# Patient Record
Sex: Male | Born: 1940
Health system: Southern US, Community
[De-identification: ages and names within clinical notes are randomized; demographics above are authoritative.]

## PROBLEM LIST (undated history)

## (undated) DIAGNOSIS — D126 Benign neoplasm of colon, unspecified: Secondary | ICD-10-CM

## (undated) DIAGNOSIS — K219 Gastro-esophageal reflux disease without esophagitis: Secondary | ICD-10-CM

## (undated) DIAGNOSIS — F419 Anxiety disorder, unspecified: Secondary | ICD-10-CM

## (undated) DIAGNOSIS — K8020 Calculus of gallbladder without cholecystitis without obstruction: Secondary | ICD-10-CM

## (undated) DIAGNOSIS — M1990 Unspecified osteoarthritis, unspecified site: Secondary | ICD-10-CM

## (undated) DIAGNOSIS — I7771 Dissection of carotid artery: Secondary | ICD-10-CM

## (undated) DIAGNOSIS — I10 Essential (primary) hypertension: Secondary | ICD-10-CM

## (undated) DIAGNOSIS — I493 Ventricular premature depolarization: Secondary | ICD-10-CM

## (undated) DIAGNOSIS — Z77098 Contact with and (suspected) exposure to other hazardous, chiefly nonmedicinal, chemicals: Secondary | ICD-10-CM

## (undated) DIAGNOSIS — H409 Unspecified glaucoma: Secondary | ICD-10-CM

## (undated) DIAGNOSIS — I7773 Dissection of renal artery: Secondary | ICD-10-CM

## (undated) DIAGNOSIS — H3500 Unspecified background retinopathy: Secondary | ICD-10-CM

## (undated) DIAGNOSIS — Z8619 Personal history of other infectious and parasitic diseases: Secondary | ICD-10-CM

## (undated) DIAGNOSIS — K5790 Diverticulosis of intestine, part unspecified, without perforation or abscess without bleeding: Secondary | ICD-10-CM

## (undated) DIAGNOSIS — K602 Anal fissure, unspecified: Secondary | ICD-10-CM

## (undated) DIAGNOSIS — F0390 Unspecified dementia without behavioral disturbance: Secondary | ICD-10-CM

## (undated) HISTORY — DX: Dissection of carotid artery: I77.71

## (undated) HISTORY — DX: Benign neoplasm of colon, unspecified: D12.6

## (undated) HISTORY — DX: Personal history of other infectious and parasitic diseases: Z86.19

## (undated) HISTORY — DX: Gastro-esophageal reflux disease without esophagitis: K21.9

## (undated) HISTORY — DX: Essential (primary) hypertension: I10

## (undated) HISTORY — DX: Anal fissure, unspecified: K60.2

## (undated) HISTORY — PX: VASECTOMY: SHX75

## (undated) HISTORY — DX: Diverticulosis of intestine, part unspecified, without perforation or abscess without bleeding: K57.90

## (undated) HISTORY — DX: Dissection of renal artery: I77.73

## (undated) HISTORY — DX: Unspecified glaucoma: H40.9

## (undated) HISTORY — PX: HERNIA REPAIR: SHX51

## (undated) HISTORY — DX: Contact with and (suspected) exposure to other hazardous, chiefly nonmedicinal, chemicals: Z77.098

## (undated) HISTORY — DX: Anxiety disorder, unspecified: F41.9

## (undated) HISTORY — DX: Calculus of gallbladder without cholecystitis without obstruction: K80.20

## (undated) HISTORY — PX: TOTAL SHOULDER REPLACEMENT: SUR1217

## (undated) HISTORY — DX: Unspecified osteoarthritis, unspecified site: M19.90

## (undated) HISTORY — DX: Unspecified background retinopathy: H35.00

## (undated) HISTORY — PX: NOSE SURGERY: SHX723

## (undated) HISTORY — DX: Ventricular premature depolarization: I49.3

---
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
## 1963-02-11 HISTORY — PX: TONSILLECTOMY: SUR1361

## 1967-02-11 DIAGNOSIS — Z8619 Personal history of other infectious and parasitic diseases: Secondary | ICD-10-CM

## 1967-02-11 HISTORY — DX: Personal history of other infectious and parasitic diseases: Z86.19

## 1983-02-11 HISTORY — PX: CERVICAL FUSION: SHX112

## 1988-02-11 HISTORY — PX: CHOLECYSTECTOMY: SHX55

## 1999-04-25 ENCOUNTER — Encounter: Payer: Self-pay | Admitting: Neurology

## 1999-04-25 ENCOUNTER — Ambulatory Visit (HOSPITAL_COMMUNITY): Admission: RE | Admit: 1999-04-25 | Discharge: 1999-04-25 | Payer: Self-pay | Admitting: Neurology

## 1999-10-09 ENCOUNTER — Inpatient Hospital Stay (HOSPITAL_COMMUNITY): Admission: EM | Admit: 1999-10-09 | Discharge: 1999-10-12 | Payer: Self-pay

## 1999-10-11 ENCOUNTER — Encounter: Payer: Self-pay | Admitting: Cardiology

## 1999-12-18 ENCOUNTER — Ambulatory Visit (HOSPITAL_COMMUNITY): Admission: RE | Admit: 1999-12-18 | Discharge: 1999-12-18 | Payer: Self-pay | Admitting: *Deleted

## 1999-12-18 ENCOUNTER — Encounter: Payer: Self-pay | Admitting: *Deleted

## 2005-05-21 ENCOUNTER — Ambulatory Visit: Payer: Self-pay | Admitting: Cardiovascular Disease

## 2005-06-11 ENCOUNTER — Ambulatory Visit: Payer: Self-pay

## 2005-06-11 ENCOUNTER — Encounter: Payer: Self-pay | Admitting: Internal Medicine

## 2005-06-12 ENCOUNTER — Ambulatory Visit: Payer: Self-pay | Admitting: Cardiovascular Disease

## 2006-09-04 ENCOUNTER — Ambulatory Visit: Payer: Self-pay | Admitting: Gastroenterology

## 2006-10-15 ENCOUNTER — Ambulatory Visit: Payer: Self-pay | Admitting: Gastroenterology

## 2007-04-28 ENCOUNTER — Ambulatory Visit: Payer: Self-pay | Admitting: Cardiology

## 2007-05-06 ENCOUNTER — Ambulatory Visit: Payer: Self-pay

## 2007-05-06 ENCOUNTER — Encounter: Payer: Self-pay | Admitting: Cardiology

## 2007-05-18 ENCOUNTER — Ambulatory Visit: Payer: Self-pay | Admitting: Cardiology

## 2007-10-05 ENCOUNTER — Ambulatory Visit: Payer: Self-pay | Admitting: Internal Medicine

## 2007-10-05 ENCOUNTER — Inpatient Hospital Stay (HOSPITAL_COMMUNITY): Admission: EM | Admit: 2007-10-05 | Discharge: 2007-10-07 | Payer: Self-pay | Admitting: Emergency Medicine

## 2007-10-12 ENCOUNTER — Ambulatory Visit (HOSPITAL_COMMUNITY): Admission: RE | Admit: 2007-10-12 | Discharge: 2007-10-12 | Payer: Self-pay | Admitting: Cardiology

## 2007-10-12 ENCOUNTER — Ambulatory Visit: Payer: Self-pay | Admitting: Cardiovascular Disease

## 2007-10-25 DIAGNOSIS — I10 Essential (primary) hypertension: Secondary | ICD-10-CM | POA: Insufficient documentation

## 2007-10-26 ENCOUNTER — Ambulatory Visit: Payer: Self-pay | Admitting: Pulmonary Disease

## 2007-10-26 DIAGNOSIS — J309 Allergic rhinitis, unspecified: Secondary | ICD-10-CM | POA: Insufficient documentation

## 2007-11-03 DIAGNOSIS — G4733 Obstructive sleep apnea (adult) (pediatric): Secondary | ICD-10-CM | POA: Insufficient documentation

## 2007-11-16 ENCOUNTER — Ambulatory Visit: Payer: Self-pay | Admitting: Cardiology

## 2007-11-25 ENCOUNTER — Emergency Department (HOSPITAL_COMMUNITY): Admission: EM | Admit: 2007-11-25 | Discharge: 2007-11-26 | Payer: Self-pay | Admitting: Emergency Medicine

## 2007-12-03 ENCOUNTER — Ambulatory Visit: Payer: Self-pay | Admitting: Cardiology

## 2007-12-20 ENCOUNTER — Ambulatory Visit: Payer: Self-pay | Admitting: Internal Medicine

## 2008-01-14 ENCOUNTER — Ambulatory Visit: Payer: Self-pay | Admitting: Internal Medicine

## 2008-02-14 ENCOUNTER — Ambulatory Visit: Payer: Self-pay | Admitting: Internal Medicine

## 2008-03-07 ENCOUNTER — Ambulatory Visit (HOSPITAL_BASED_OUTPATIENT_CLINIC_OR_DEPARTMENT_OTHER): Admission: RE | Admit: 2008-03-07 | Discharge: 2008-03-07 | Payer: Self-pay | Admitting: Internal Medicine

## 2008-03-10 ENCOUNTER — Ambulatory Visit: Payer: Self-pay | Admitting: Pulmonary Disease

## 2008-04-10 ENCOUNTER — Encounter: Payer: Self-pay | Admitting: Internal Medicine

## 2008-04-10 ENCOUNTER — Ambulatory Visit: Payer: Self-pay

## 2008-04-10 ENCOUNTER — Ambulatory Visit: Payer: Self-pay | Admitting: Internal Medicine

## 2008-04-19 ENCOUNTER — Ambulatory Visit: Payer: Self-pay | Admitting: Internal Medicine

## 2008-06-21 ENCOUNTER — Encounter (INDEPENDENT_AMBULATORY_CARE_PROVIDER_SITE_OTHER): Payer: Self-pay | Admitting: *Deleted

## 2008-08-09 ENCOUNTER — Ambulatory Visit: Payer: Self-pay | Admitting: Internal Medicine

## 2008-08-09 DIAGNOSIS — R0789 Other chest pain: Secondary | ICD-10-CM | POA: Insufficient documentation

## 2008-08-09 DIAGNOSIS — I4949 Other premature depolarization: Secondary | ICD-10-CM

## 2008-08-09 DIAGNOSIS — I7779 Dissection of other artery: Secondary | ICD-10-CM

## 2008-11-03 ENCOUNTER — Ambulatory Visit: Payer: Self-pay | Admitting: Internal Medicine

## 2008-11-03 ENCOUNTER — Ambulatory Visit: Payer: Self-pay

## 2008-11-03 ENCOUNTER — Encounter: Payer: Self-pay | Admitting: Internal Medicine

## 2009-04-23 ENCOUNTER — Telehealth (INDEPENDENT_AMBULATORY_CARE_PROVIDER_SITE_OTHER): Payer: Self-pay | Admitting: *Deleted

## 2009-05-03 ENCOUNTER — Encounter: Payer: Self-pay | Admitting: Gastroenterology

## 2009-05-08 ENCOUNTER — Encounter (INDEPENDENT_AMBULATORY_CARE_PROVIDER_SITE_OTHER): Payer: Self-pay | Admitting: *Deleted

## 2009-06-13 ENCOUNTER — Ambulatory Visit: Payer: Self-pay | Admitting: Gastroenterology

## 2009-06-13 DIAGNOSIS — R1310 Dysphagia, unspecified: Secondary | ICD-10-CM | POA: Insufficient documentation

## 2009-06-13 DIAGNOSIS — Z860100 Personal history of colon polyps, unspecified: Secondary | ICD-10-CM | POA: Insufficient documentation

## 2009-06-14 ENCOUNTER — Ambulatory Visit: Payer: Self-pay | Admitting: Gastroenterology

## 2009-07-20 ENCOUNTER — Encounter: Payer: Self-pay | Admitting: Cardiology

## 2009-08-01 ENCOUNTER — Telehealth (INDEPENDENT_AMBULATORY_CARE_PROVIDER_SITE_OTHER): Payer: Self-pay | Admitting: *Deleted

## 2009-08-16 ENCOUNTER — Telehealth (INDEPENDENT_AMBULATORY_CARE_PROVIDER_SITE_OTHER): Payer: Self-pay | Admitting: *Deleted

## 2009-08-30 ENCOUNTER — Telehealth: Payer: Self-pay | Admitting: Internal Medicine

## 2009-09-04 ENCOUNTER — Ambulatory Visit: Payer: Self-pay | Admitting: Cardiology

## 2009-09-04 ENCOUNTER — Emergency Department (HOSPITAL_COMMUNITY): Admission: EM | Admit: 2009-09-04 | Discharge: 2009-09-04 | Payer: Self-pay | Admitting: Emergency Medicine

## 2009-09-12 ENCOUNTER — Telehealth: Payer: Self-pay | Admitting: Cardiology

## 2009-12-06 ENCOUNTER — Ambulatory Visit: Payer: Self-pay

## 2009-12-06 ENCOUNTER — Ambulatory Visit: Payer: Self-pay | Admitting: Cardiology

## 2009-12-06 ENCOUNTER — Encounter: Payer: Self-pay | Admitting: Internal Medicine

## 2009-12-06 ENCOUNTER — Encounter: Payer: Self-pay | Admitting: Cardiology

## 2009-12-06 ENCOUNTER — Ambulatory Visit (HOSPITAL_COMMUNITY): Admission: RE | Admit: 2009-12-06 | Discharge: 2009-12-06 | Payer: Self-pay | Admitting: Cardiology

## 2009-12-06 ENCOUNTER — Ambulatory Visit: Payer: Self-pay | Admitting: Internal Medicine

## 2010-03-12 NOTE — Progress Notes (Signed)
Summary: heart fluttering/high blood pressure   Phone Note Call from Patient   Caller: Patient [phone] or [phone] Reason for Call: Talk to Nurse Summary of Call: pt having fluttering the last couple weeks, with some high blood pressure-pls call [phone] or [phone] Initial call taken by: Glynda Jaeger,  August 30, 2009 4:01 PM  Follow-up for Phone Call        talked with pt-pt received letter to schedule appt for Sept--pt has been having some increase in symptoms and is requesting an earlier appt--have given pt appt for 09/04/09 with Dr Graciela Husbands

## 2010-03-12 NOTE — Procedures (Signed)
Summary: Upper Endoscopy  Patient: [name] [name] Note: All result statuses are Final unless otherwise noted.  Tests: (1) Upper Endoscopy (EGD)   EGD Upper Endoscopy       DONE     [facility]     [address]     [place], [place]  [address]           ENDOSCOPY PROCEDURE REPORT           PATIENT:  [name] [name]  MR#:  [id number]     BIRTHDATE:  1940-12-07, 69 yrs. old  GENDER:  male           ENDOSCOPIST:  Barbette Hair. Arlyce Dice, MD     Referred by:           PROCEDURE DATE:  06/14/2009     PROCEDURE:  EGD, diagnostic, Maloney Dilation of Esophagus     ASA CLASS:  Class III     INDICATIONS:  chest pain, dysphagia           MEDICATIONS:   Fentanyl 50 mcg IV, Versed 6 mg IV, glycopyrrolate     (Robinal) 0.2 mg IV, 0.6cc simethancone 0.6 cc PO     TOPICAL ANESTHETIC:  Exactacain Spray           DESCRIPTION OF PROCEDURE:   After the risks benefits and     alternatives of the procedure were thoroughly explained, informed     consent was obtained.  The LB [id number]-H180 [id number] endoscope was     introduced through the mouth and advanced to the third portion of     the duodenum, without limitations.  The instrument was slowly     withdrawn as the mucosa was fully examined.     <<PROCEDUREIMAGES>>           A stricture was found at the gastroesophageal junction (see     image1). Early stricture Dilation with maloney dilator 18mm     Minimal resistance; no heme  Otherwise the examination was normal.     Retroflexed views revealed no abnormalities.    The scope was then     withdrawn from the patient and the procedure completed.           COMPLICATIONS:  None           ENDOSCOPIC IMPRESSION:     1) Stricture at the gastroesophageal junction - s/p maloney     dilitation     2) Otherwise normal examination           Findings do not clearly explain chest pain           RECOMMENDATIONS:     1) continue current medications - omeprazole     2) Call office next 2-3 days to  schedule an office appointment     for 1 month           REPEAT EXAM:  No           ______________________________     Barbette Hair. Arlyce Dice, MD           CC:  Robert Bellow, MD, Duke Salvia, MD           n.     Rosalie Doctor:   Barbette Hair. Kaplan at 06/14/2009 11:14 AM           Janan Halter, [phone]  Note: An exclamation mark (!) indicates a result that was not dispersed into the flowsheet. Document Creation Date: 06/14/2009 11:14 AM _______________________________________________________________________  (  1) Order result status: Final Collection or observation date-time: 06/14/2009 11:09 Requested date-time:  Receipt date-time:  Reported date-time:  Referring Physician:   Ordering Physician: Melvia Heaps [phone]) Specimen Source:  Source: Launa Grill Order Number: [id number] Lab site:   Appended Document: Upper Endoscopy

## 2010-03-12 NOTE — Letter (Signed)
Summary: EGD Instructions  [facility]  [address] [place], [place] [address]   Phone: [phone]  Fax: [phone]       [name] [name]    [birth year]    MRN: [id number]       Procedure Day /Date:06/14/2009 THURSDAY     Arrival Time: 9:30AM     Procedure Time:10:30AM    Location of Procedure:                    X  [facility] (4th Floor)    PREPARATION FOR ENDOSCOPY/?DIL   On 06/14/2009 THE DAY OF THE PROCEDURE:  1.   No solid foods, milk or milk products are allowed after midnight the night before your procedure.  2.   Do not drink anything colored red or purple.  Avoid juices with pulp.  No orange juice.  3.  You may drink clear liquids until 8:30AM which is 2 hours before your procedure.                                                                                                CLEAR LIQUIDS INCLUDE: Water Jello Ice Popsicles Tea (sugar ok, no milk/cream) Powdered fruit flavored drinks Coffee (sugar ok, no milk/cream) Gatorade Juice: apple, white grape, white cranberry  Lemonade Clear bullion, consomm, broth Carbonated beverages (any kind) Strained chicken noodle soup Hard Candy   MEDICATION INSTRUCTIONS  Unless otherwise instructed, you should take regular prescription medications with a small sip of water as early as possible the morning of your procedure.              OTHER INSTRUCTIONS  You will need a responsible adult at least 68 years of age to accompany you and drive you home.   This person must remain in the waiting room during your procedure.  Wear loose fitting clothing that is easily removed.  Leave jewelry and other valuables at home.  However, you may wish to bring a book to read or an iPod/MP3 player to listen to music as you wait for your procedure to start.  Remove all body piercing jewelry and leave at home.  Total time from sign-in until discharge is approximately 2-3 hours.  You should go home  directly after your procedure and rest.  You can resume normal activities the day after your procedure.  The day of your procedure you should not:   Drive   Make legal decisions   Operate machinery   Drink alcohol   Return to work  You will receive specific instructions about eating, activities and medications before you leave.    The above instructions have been reviewed and explained to me by   _______________________    I fully understand and can verbalize these instructions _____________________________ Date _________

## 2010-03-12 NOTE — Progress Notes (Signed)
   Phone Note Outgoing Call   Call placed by: Scherrie Bateman, LPN,  August 17, 8467 3:33 PM Call placed to: Patient Summary of Call: PT AWARE [facility] WORK FILLED OUT AND LEFT AT FRONT DESK FOR PICK UP. Initial call taken by: Scherrie Bateman, LPN,  August 16, 6293 3:34 PM

## 2010-03-12 NOTE — Progress Notes (Signed)
   Walk in Patient Form Recieved " pt left paper from Physicians statement for heart disease" sent to Message nurse [facility]  August 01, 2009 3:11 PM

## 2010-03-12 NOTE — Letter (Signed)
Summary: Physicians's Statement for Heart Disease  Physicians's Statement for Heart Disease   Imported By: Marylou Mccoy 09/12/2009 16:12:46  _____________________________________________________________________  External Attachment:    Type:   Image     Comment:   External Document

## 2010-03-12 NOTE — Procedures (Signed)
Summary: colonoscopy   Colonoscopy  Procedure date:  10/15/2006  Findings:      Results: Diverticulosis.       Location:  [facility].   Patient Name: [name], [name] MRN:  Procedure Procedures: Colonoscopy CPT: 16109.  Personnel: Endoscopist: Barbette Hair. Arlyce Dice, MD.  Referred By: Jonita Albee, MD.  Patient Consent: Procedure, Alternatives, Risks and Benefits discussed, consent obtained, from patient.  Indications  Surveillance of: Adenomatous Polyp(s).  History  Current Medications: Patient is not currently taking Coumadin.  Pre-Exam Physical: Performed Oct 15, 2006. Cardio-pulmonary exam, HEENT exam , Abdominal exam, Mental status exam WNL.  Comments: Patient history reviewed/updated, physical performed prior to initiation of sedation?yes Exam Exam: Extent of exam reached: Cecum, extent intended: Cecum.  Time to Cecum: 00:03:48. Time for Withdrawl: 00:05:13. Colon retroflexion performed. ASA Classification: II. Tolerance: good.  Monitoring: Pulse and BP monitoring, Oximetry used. Supplemental O2 given. at 2 Liters.  Colon Prep Used Miralax for colon prep. Prep results: good.  Sedation Meds: Patient assessed and found to be appropriate for moderate (conscious) sedation. Sedation was managed by the Endoscopist. Fentanyl 75 mcg. given IV. Versed 5 mg. given IV.  Findings - DIVERTICULOSIS: Transverse Colon to Sigmoid Colon. ICD9: Diverticulosis: 562.10. Comments: Diffuse diverticulosis, especially in left colon.  - NORMAL EXAM: Cecum to Transverse Colon.  NORMAL EXAM: Cecum.  - NORMAL EXAM: Sigmoid Colon to Rectum.   Assessment Abnormal examination, see findings above.  Diagnoses: 562.10: Diverticulosis.   Events  Unplanned Interventions: No intervention was required.  Unplanned Events: There were no complications. Plans  Post Exam Instructions: Post sedation instructions given.  Patient Education: Patient given standard  instructions for: Diverticulosis.  Disposition: After procedure patient sent to recovery. After recovery patient sent home.  Scheduling/Referral: Colonoscopy, to Barbette Hair. Arlyce Dice, MD, around Oct 15, 2011.    This report was created from the original endoscopy report, which was reviewed and signed by the above listed endoscopist.    cc. [name]

## 2010-03-12 NOTE — Assessment & Plan Note (Signed)
Summary: SEVERE GERD...EM    History of Present Illness Primary GI MD: Melvia Heaps MD [facility] Primary Provider: Dr. Perrin Maltese Requesting Provider: Karessa Onorato Bellow, MD History of Present Illness:   [name] [name] is a pleasant 70 year old white male referred at the request of Dr. Perrin Maltese for evaluation of chest pain.  He has a cardiomyopathy and has experienced several episodes of severe mid and lower chest pain.  Pain is spontaneous and without radiation.  He also has had multiple episodes of a mild but similar pain that was improved when he took prevacid.   He denies pyrosis.  He does have mild dysphagia to solids.  It is noteworthy that he takes Celebrex daily and occasional Aleve.  The patient's frequent PVCs which is attributed to his cardiomyopathy.   GI Review of Systems    Reports acid reflux, belching, chest pain, and  dysphagia with solids.      Denies abdominal pain, bloating, dysphagia with liquids, heartburn, loss of appetite, nausea, vomiting, vomiting blood, weight loss, and  weight gain.      Reports diverticulosis, hemorrhoids, liver problems, and  rectal bleeding.     Denies anal fissure, black tarry stools, change in bowel habit, constipation, diarrhea, fecal incontinence, heme positive stool, irritable bowel syndrome, jaundice, light color stool, and  rectal pain.    Current Medications (verified): 1)  Lisinopril 20 Mg Tabs (Lisinopril) .Marland Kitchen.. 1 Tab Once Daily 2)  Ecotrin 325 Mg Tbec (Aspirin) .... Take 1 Tablet By Mouth Once A Day 3)  Fluticasone Propionate 50 Mcg/act Susp (Fluticasone Propionate) .... Take 2 By Mouth Once Daily 4)  Vitamin D 1000 Unit Tabs (Cholecalciferol) .... 2 Caps Once Daily 5)  Vitamin C 1000 Mg Tabs (Ascorbic Acid) .Marland Kitchen.. 1 Tab Once Daily 6)  Verapamil Hcl Cr 120 Mg Xr24h-Cap (Verapamil Hcl) .... Take One Capsule By Mouth Once Daily 7)  Prevacid 30 Mg Cpdr (Lansoprazole) .... As Needed 8)  Celebrex 200 Mg Caps (Celecoxib) .Marland Kitchen.. 1 Cap Two Times A Day 9)  Ultram  Er 200 Mg Xr24h-Tab (Tramadol Hcl) .... As Needed 10)  Aleve 220 Mg Tabs (Naproxen Sodium) .... As Needed 11)  Alprazolam 0.25 Mg Tabs (Alprazolam) .Marland Kitchen.. 1 By Mouth Three Times A Day As Needed  Allergies (verified): 1)  ! Penicillin 2)  ! Morphine 3)  ! Percocet 4)  ! * Ivp Dye 5)  ! 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 what type Anal Fissure Anxiety Disorder Arthritis Asthma Adenomatous Colon Polyps Diverticulosis Gallstones Hypertension cardiomyopathy  Past Surgical History: Appendectomy 1963 Cervical fusion 1985 Cholecystectomy 1990 Tonsillectomy 1965 Hernia Surgery Carotid Disection Rt. Renal Artery Disection  Family History: Reviewed history from 10/26/2007 and no changes required. Allergies Self Father Heart Disease  Family History of Colon Cancer:uncle  Social History: Reviewed history from 10/26/2007 and no changes required. He is an ex-Marine.  He is married. He has three children. He is a retired Emergency planning/management officer. Patient states former smoker.  Alcohol Use - yes  4-5 per day Daily Caffeine Use Illicit Drug Use - no Drug Use:  no  Review of Systems       The patient complains of allergy/sinus, anxiety-new, arthritis/joint pain, back pain, change in vision, hearing problems, heart rhythm changes, sleeping problems, thirst - excessive, and urination - excessive.  The patient denies blood in urine, breast changes/lumps, confusion, cough, coughing up blood, depression-new, fainting, fatigue, fever, headaches-new, heart murmur, itching, muscle pains/cramps, night sweats, nosebleeds, shortness of breath, skin rash, sore throat, swelling of feet/legs, swollen lymph glands, urination changes/pain, urine leakage, vision changes, and voice  change.         All other systems were reviewed and were negative   Vital Signs:  Patient profile:   70 year old male Height:      70 inches Weight:      243.6 pounds BMI:     35.08 Pulse rate:   72 / minute Pulse rhythm:   regular BP sitting:   162 / 90  (left arm)  Vitals Entered By: [name] NCMA (Jun 13, 2009 10:10 AM)  Physical Exam  Additional Exam:  On physical exam he is well-developed well-nourished male  skin: anicteric HEENT: normocephalic; PEERLA; no nasal or pharyngeal abnormalities neck: supple nodes: no cervical lymphadenopathy chest: clear to ausculatation and percussion heart: no murmurs, gallops, or rubs abd: soft, nontender; BS normoactive; no abdominal masses, tenderness, organomegaly rectal: deferred ext: no cynanosis, clubbing, edema skeletal: no deformities neuro: oriented x 3; no focal abnormalities    Impression & Recommendations:  Problem # 1:  CHEST DISCOMFORT (ICD-786.59) Pain could be due to esophageal disease including  reflux, spasm and/or an esophageal stricture.  Symptoms of a stricture usually center around dysphagia.  Cardiac pain remains a distinct possibility.  Finally, it is noteworthy that the patient is taking Celebrex and occasional Aleve which could be contributing to his symptoms.  Recommendations #1 continue omeprazole #2 upper endoscopy  Risks, alternatives, and complications of the procedure, including bleeding, perforation, and possible need for surgery, were explained to the patient.  Patient's questions were answered.  Problem # 2:  DYSPHAGIA UNSPECIFIED (ICD-787.20)  Rule out early esophageal stricture  Recommendations #1 upper endoscopy with dilatation as indicated  Orders: EGD (EGD)  Problem # 3:  CARDIOMYOPATHY, SECONDARY PVC RELATED IMPROVED (ICD-425.9) Assessment: Comment Only  Problem # 4:  PERSONAL HISTORY OF COLONIC POLYPS (ICD-V12.72) Plan followup colonoscopy in 2013  Problem # 5:   OBSTRUCTIVE SLEEP APNEA (ICD-327.23) Assessment: Comment Only  Patient Instructions: 1)  Copy sent to : [name] 2)  Your EGD is scheduled for 06/14/2009 at 10:30am 3)  Please contact Lelend Heinecke Bellow office about your Blood Pressure 4)  Colonoscopy and Flexible Sigmoidoscopy brochure given.  5)  Conscious Sedation brochure given.  6)  The medication list was reviewed and reconciled.  All changed / newly prescribed medications were explained.  A complete medication list was provided to the patient / caregiver. Prescriptions: OMEPRAZOLE 40 MG CPDR (OMEPRAZOLE) take one tab daily  #30 x 2   Entered and Authorized by:   Louis Meckel MD   Signed by:   Louis Meckel MD on 06/13/2009   Method used:   Electronically to        [facility] [address] [phone]* (retail)       [address]       [place], [place]  [address]       Ph: [phone] or [phone]       Fax: [phone]   RxID:   [id number]

## 2010-03-12 NOTE — Letter (Signed)
Summary: New Patient letter  [facility]  [address] [place], [place] [address]   Phone: [phone]  Fax: [phone]       05/08/2009 MRN: [id number]  [name] [name] [address] [place], [place]  [address]  Dear [name] [name],  Welcome to the Gastroenterology Division at [facility].    You are scheduled to see Dr.  Melvia Heaps on Jun 13, 2009 at 10:00am on the 3rd floor at [facility], [address]  We ask that you try to arrive at our office 15 minutes prior to your appointment time to allow for check-in.  We would like you to complete the enclosed self-administered evaluation form prior to your visit and bring it with you on the day of your appointment.  We will review it with you.  Also, please bring a complete list of all your medications or, if you prefer, bring the medication bottles and we will list them.  Please bring your insurance card so that we may make a copy of it.  If your insurance requires a referral to see a specialist, please bring your referral form from your primary care physician.  Co-payments are due at the time of your visit and may be paid by cash, check or [name] card.     Your office visit will consist of a consult with your physician (includes a physical exam), any laboratory testing he/she may order, scheduling of any necessary diagnostic testing (e.g. x-ray, ultrasound, CT-scan), and scheduling of a procedure (e.g. Endoscopy, Colonoscopy) if required.  Please allow enough time on your schedule to allow for any/all of these possibilities.    If you cannot keep your appointment, please call [phone] to cancel or reschedule prior to your appointment date.  This allows [place] the opportunity to schedule an appointment for another patient in need of care.  If you do not cancel or reschedule by 5 p.m. the business day prior to your appointment date, you will be charged a $50.00 late cancellation/no-show fee.    Thank  you for choosing [facility] for your medical needs.  We appreciate the opportunity to care for you.  Please visit [place] at our website  to learn more about our practice.                     Sincerely,                                                             The Gastroenterology Division

## 2010-03-12 NOTE — Progress Notes (Signed)
Summary: questions re med   Phone Note Call from Patient   Caller: Patient Reason for Call: Talk to Nurse Summary of Call: pt d/c from hosptial and instructions to stop med were different from the med the dr told him to stop-pls advise ([phone] or [phone] Initial call taken by: Glynda Jaeger,  September 12, 2009 4:44 PM  Follow-up for Phone Call        Pt will stop Celexa ( Citralopam) as per his 09/04/09 hospital visiit, and restart Celebrex.  He is taking HCTZ. Mylo Red RN     Appended Document: questions re med  Reviewed Juanito Doom, MD

## 2010-03-12 NOTE — Progress Notes (Signed)
   Emailed pt A ROI Taylor Mesiemore  April 23, 2009 11:18 AM    Appended Document:  Pt brought ROI back in today, will get REcords together for him, CAll when ready he will pick-up

## 2010-03-12 NOTE — Assessment & Plan Note (Signed)
Summary: [facility]  Medications Added TYLENOL 8 HOUR 650 MG CR-TABS (ACETAMINOPHEN) as needed HYDROCODONE-ACETAMINOPHEN 5-325 MG TABS (HYDROCODONE-ACETAMINOPHEN) as needed TAMSULOSIN HCL 0.4 MG CAPS (TAMSULOSIN HCL) 1 tab by mouth at bedtime * OMEGA 3 1 tab by mouth once daily        Referring Provider:  Dr. Daleen Squibb Primary Provider:  Dr. Perrin Maltese  CC:  chest pain.  History of Present Illness: [name] [name] returns in followup for PVC associated cardiomyopathy    He feels quite well without significant exercise intolerance.  He comes in today for followup and an echo  In the interim since his last visit he was seen at the [place] and because of an abnormal echo cardiogram shipped to [facility]. He was kept overnight. Evaluation demonstrated ejection fraction of 41%.  He was also started on citrulapram for PTSD which resulted in having a heart rate of 35. He was seen by Dr. wall in the emergency room where he was found to be ( inferred) in bigeminy that is better. The patient has had problems with chest pain. He underwent an endoscopy was found to have a stricture and his chest pain is now markedly relieved    Current Medications (verified): 1)  Lisinopril 20 Mg Tabs (Lisinopril) .Marland Kitchen.. 1 Tab Once Daily 2)  Ecotrin 325 Mg Tbec (Aspirin) .... Take 1 Tablet By Mouth Once A Day 3)  Fluticasone Propionate 50 Mcg/act Susp (Fluticasone Propionate) .... Take 2 By Mouth Once Daily 4)  Vitamin D 1000 Unit Tabs (Cholecalciferol) .... 2 Caps Once Daily 5)  Vitamin C 1000 Mg Tabs (Ascorbic Acid) .Marland Kitchen.. 1 Tab Once Daily 6)  Verapamil Hcl Cr 120 Mg Xr24h-Cap (Verapamil Hcl) .... Take One Capsule By Mouth Once Daily 7)  Celebrex 200 Mg Caps (Celecoxib) .Marland Kitchen.. 1 Cap Two Times A Day 8)  Ultram Er 200 Mg Xr24h-Tab (Tramadol Hcl) .... As Needed 9)  Aleve 220 Mg Tabs (Naproxen Sodium) .... As Needed 10)  Alprazolam 0.25 Mg Tabs (Alprazolam) .Marland Kitchen.. 1 By Mouth Three Times A Day As Needed 11)  Omeprazole 40 Mg Cpdr  (Omeprazole) .... Take One Tab Daily 12)  Tylenol 8 Hour 650 Mg Cr-Tabs (Acetaminophen) .... As Needed 13)  Hydrocodone-Acetaminophen 5-325 Mg Tabs (Hydrocodone-Acetaminophen) .... As Needed 14)  Tamsulosin Hcl 0.4 Mg Caps (Tamsulosin Hcl) .Marland Kitchen.. 1 Tab By Mouth At Bedtime 15)  Omega 3 .Marland Kitchen.. 1 Tab By Mouth Once Daily  Allergies: 1)  ! Penicillin 2)  ! Morphine 3)  ! Percocet 4)  ! * Ivp Dye 5)  ! 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 what type Anal Fissure Anxiety Disorder Arthritis Asthma Adenomatous Colon Polyps Diverticulosis Gallstones Hypertension cardiomyopathy  Past Surgical History: Last updated: 06/13/2009 Appendectomy 1963 Cervical fusion 1985 Cholecystectomy 1990 Tonsillectomy 1965 Hernia Surgery Carotid Disection Rt. Renal Artery Disection  Family History: Last updated: 06/13/2009 Allergies Self Father Heart Disease  Family History of Colon Cancer:uncle  Social History: Last updated: 06/13/2009 He is an ex-Marine.  He is married. He has three children. He is a retired Emergency planning/management officer. Patient states former smoker.  Alcohol Use - yes  4-5 per day Daily Caffeine Use Illicit Drug Use - no  Vital Signs:  Patient profile:   70 year old male Height:  70 inches Weight:      242 pounds BMI:     34.85 Pulse rate:   70 / minute Resp:     14 per minute BP sitting:   136 / 96  (left arm)  Vitals Entered By: Kem Parkinson (December 06, 2009 8:45 AM)  Physical Exam  General:  The patient was alert and oriented in no acute distress. HEENT Normal.  Neck veins were flat, carotids were brisk.  Lungs were clear.  Heart sounds were regular without murmurs or gallops.  Abdomen was soft with active bowel  sounds. There is no clubbing cyanosis or edema. Skin Warm and dry    Impression & Recommendations:  Problem # 1:  CHEST DISCOMFORT (ICD-786.59) This is largely relieved now following his esophageal stricture dilatation. This is consistent with our operative hypothesis. His updated medication list for this problem includes:    Lisinopril 20 Mg Tabs (Lisinopril) .Marland Kitchen... 1 tab once daily    Ecotrin 325 Mg Tbec (Aspirin) .Marland Kitchen... Take 1 tablet by mouth once a day    Verapamil Hcl Cr 120 Mg Xr24h-cap (Verapamil hcl) .Marland Kitchen... Take one capsule by mouth once daily  Orders: EKG w/ Interpretation (93000)  Problem # 2:  CARDIOMYOPATHY, SECONDARY PVC RELATED IMPROVED (ICD-425.9) echo as we repeated today. The results are pending His updated medication list for this problem includes:    Lisinopril 20 Mg Tabs (Lisinopril) .Marland Kitchen... 1 tab once daily    Ecotrin 325 Mg Tbec (Aspirin) .Marland Kitchen... Take 1 tablet by mouth once a day    Verapamil Hcl Cr 120 Mg Xr24h-cap (Verapamil hcl) .Marland Kitchen... Take one capsule by mouth once daily  Problem # 3:  VENTRICULAR ECTOPY (ICD-427.69) largely asymptomatic at this time. His updated medication list for this problem includes:    Lisinopril 20 Mg Tabs (Lisinopril) .Marland Kitchen... 1 tab once daily    Ecotrin 325 Mg Tbec (Aspirin) .Marland Kitchen... Take 1 tablet by mouth once a day    Verapamil Hcl Cr 120 Mg Xr24h-cap (Verapamil hcl) .Marland Kitchen... Take one capsule by mouth once daily  Patient Instructions: 1)  Your physician recommends that you schedule a follow-up appointment in: 6 MONTHS WITH DR Graciela Husbands 2)  Your physician recommends that you continue on your current medications as directed. Please refer to the Current Medication list given to you today.

## 2010-04-27 LAB — HEPATIC FUNCTION PANEL
Albumin: 3.9 g/dL (ref 3.5–5.2)
Indirect Bilirubin: 1.2 mg/dL — ABNORMAL HIGH (ref 0.3–0.9)
Total Bilirubin: 1.6 mg/dL — ABNORMAL HIGH (ref 0.3–1.2)
Total Protein: 6.4 g/dL (ref 6.0–8.3)

## 2010-04-27 LAB — CBC
HCT: 44.9 % (ref 39.0–52.0)
MCH: 31.8 pg (ref 26.0–34.0)
MCHC: 34.2 g/dL (ref 30.0–36.0)
MCV: 92.9 fL (ref 78.0–100.0)
RDW: 13.1 % (ref 11.5–15.5)

## 2010-04-27 LAB — DIFFERENTIAL
Basophils Relative: 0 % (ref 0–1)
Eosinophils Absolute: 0.1 10*3/uL (ref 0.0–0.7)
Eosinophils Relative: 2 % (ref 0–5)
Monocytes Relative: 10 % (ref 3–12)
Neutrophils Relative %: 68 % (ref 43–77)

## 2010-04-27 LAB — BASIC METABOLIC PANEL
BUN: 19 mg/dL (ref 6–23)
CO2: 26 mEq/L (ref 19–32)
Chloride: 103 mEq/L (ref 96–112)
Glucose, Bld: 102 mg/dL — ABNORMAL HIGH (ref 70–99)
Potassium: 4.4 mEq/L (ref 3.5–5.1)

## 2010-04-27 LAB — POCT CARDIAC MARKERS: Troponin i, poc: <0.05 ng/mL (ref 0.00–0.09)

## 2010-04-27 LAB — MAGNESIUM: Magnesium: 2.4 mg/dL (ref 1.5–2.5)

## 2010-06-25 NOTE — Discharge Summary (Signed)
[name] [name], KORBER             ACCOUNT NO.:  [id number]   MEDICAL RECORD NO.:  [id number]          PATIENT TYPE:  INP   LOCATION:  2005                         FACILITY:  [facility]   PHYSICIAN:  [name]. Wall, MD, FACCDATE OF BIRTH:  [birth year]   DATE OF ADMISSION:  10/05/2007  DATE OF DISCHARGE:  10/07/2007                               DISCHARGE SUMMARY   ALLERGIES:  This patient has allergies to SHRIMP, PENICILLIN, PERCOCET,  MORPHINE, PROTONIX, and IV CONTRAST DYE.   FINAL DIAGNOSES:  1. Admitted with      fatigue/nausea/presyncope/lightheadedness/bradycardia.  2. Continued presence of his chronic, incessant premature ventricular      contractions.      a.     New prescription of metoprolol 50 mg b.i.d. this admission.  3. Daytime somnolence, 5-6 naps per day.      a.     Pickwickian habitus.      b.     Sleep study consult is pending, to take place, October 26, 2007, at 11:45 a.m., pulmonary.      c.     History of disrupted sleep patterns - Insomnia.  4. Cardiac MRI scheduled as an outpatient by Dr. Daleen Squibb.   SECONDARY DIAGNOSES:  1. History of spontaneous dissection of the carotid artery, a left      intracerebral event.      a.     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      b.     Symptoms were facial droop and numbness of the left forehead       and face, all resolved.  2. Spontaenous dissection of the renal artery.      a.     The patient describes partial kidney infarction,       conservative treatment was advocated.  3. Previous history of irregular heartbeat and treatment for premature      ventricular contractions by Dr. Daleen Squibb.  4. Hypertension.  5. Osteoarthritis.  The patient has a right shoulder which needs      extensive repair.  Both knees have torn menisci, but this would      require a more invasive treatment and the patient is ready for.  6. A 2-D echocardiogram, March 2009.  The patient had frequent  ectopy,      and therefore registered a falsely low ejection fraction of 40%.      He also had carotid duplex ultrasound in April 2009, no obstructive      plaque and he had a stress test in April 2009, no ischemia or      infarct (these 3 interventions, the stress test, the      echocardiogram, and carotid Doppler were performed by Dr. Daleen Squibb as a      cardiac workup on his first visit with Dr. Daleen Squibb).   No procedures this admission.   BRIEF HISTORY:  [name] [name] is a 70 year old male.  He woke on the  morning of October 05, 2007, fairly refreshed.  He was not hungry, but  he  did have 2 cups of caffeinated coffee.  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  He felt sudden onset of fatigue and nausea.  He rested a  bit, but did not feel better.  He took a shower.  His wife had taken his  heart rate and blood pressure.  When the symptoms occurred, the heart  rate was 40 and blood pressure 130/80.   The patient went to his brother-in-law's office, an electrocardiogram  there showed sinus rhythm with couplets and multifocal PVCs.  The  patient had also felt some dizziness with the onset of fatigue and  nausea.  This did not threatened to cause him to pass out, but when he  had walked he felt the need to study himself a bit.  The patient does  not complain of any heart racing or palpitation.  He is not having chest  pain.  He has never had a history of frank syncope in the past.  He is  not having dyspnea on exertion or dyspnea at rest.   What does stand out a bit is the fact that the patient gets easy  somnolence during the day.  He takes 5-6 naps a day.  He will suddenly  come over tired, he sits on the rest and is instantly asleep.  Often, he  is awake full in the middle of the night.  He gets up and reads 1, 2,  and 3 hours often just gets up at 3 o'clock in the morning or 4 o'clock  in the morning.   Sometimes when driving, he will ask his wife to take his place and  drive.  When  he becomes a passenger immediately falls asleep.  His wife  does not relate any obvious snoring or apnea.  The patient's habitus is  pickwickian and the patient has had the sleep study recommended in the  past.   He has been seen by Dr. Daleen Squibb in the past in March and April 2009 for  irregular heartbeats.  Electrocardiogram did show frequent ventricular  ectopy.  He did have a stress test early this year in April, which  showed no ischemia.  He had a carotid Doppler study, which showed no  obstructive plaque.  The recommendations by Dr. Daleen Squibb, at that time were  to accomplish weight reduction, continue an active lifestyle, and  continue antihypertensive medication with lisinopril and  hydrochlorothiazide.   HOSPITAL COURSE:  The patient admitted from Dr. Ernestene Mention office on October 05, 2007, with lightheadedness, fatigue, and nausea.  An  electrocardiogram at Dr. Ernestene Mention office showed evidence of frequent  PVCs.  He was seen in consultation by Dr. Sherryl Manges, who noted  carefully with history that the patient was having recurrent presyncope  and probably has obstructive sleep apnea.  He recommended that the  patient be admitted and ruled out for myocardial infarction with serial  cardiac enzymes, and that he would a have sleep study and possibly beta-  blocker therapy as well as cardiac MRI.  He was seen during this  hospitalization for the last 2 days by Dr. Daleen Squibb and he was begun by Dr.  Daleen Squibb on metoprolol 25 mg twice a day, increased to 50 mg b.i.d. prior to  discharge.  The patient was having frequent PVCs, multifocal with  couplets throughout this hospitalization.  His cardiac enzymes were  cycled and there were negative x3.  His troponin I studies were less  than 0.01, less than 0.01,  and 0.01.  His TSH was 1.354.  He was  continued on his lisinopril and hydrochlorothiazide.  His blood pressure  constantly monitored here and is somewhat elevated despite both the  lisinopril and  hydrochlorothiazide tablets and metoprolol 25 mg b.i.d.,  dose justifying increased to 50 b.i.d. of metoprolol at discharge.  At  the time of discharge, the patient is not having chest pain, not having  palpitations, and is not short of breath.   He discharges on October 07, 2007, on the following medications:  1. Metoprolol 50 mg twice daily, this is new and he gets a      prescription.  2. Ecotrin 325 mg daily.  3. Vitamin C 1000 mg daily.  4. Multivitamin daily.  5. Lisinopril/hydrochlorothiazide 20/12.5 daily.  6. Ultram ER 200 mg as needed.  7. Vitamin D 1.25 mg  as needed.  8. Nexium 40 mg as needed.  9. Aleve as needed.  10.Tylenol as needed.  11.Fluticasone propionate nasal spray 50 mcg shot as needed.   1. He follows up at [facility] for a sleep study.  This is      across in [place] in the [place] building, Tuesday, October 26, 2007, at 11:45, he will see Dr. Shelle Iron.  At that time, he is      asked to bring the actual bottles of all medications and to bring a      current insurance card.  2. Cardiac MRI.  This will be held on October 12, 2007, at 1:15,      outpatient registration at [facility].  3. To follow up with Dr. Daleen Squibb, Tuesday, November 16, 2007, at 2:45 p.m.   LABORATORY STUDIES THIS ADMISSION:  The D-dimer was 0.29.  Hemoglobin  14.3 and hematocrit 42.  Sodium 137, potassium 3.9, chloride 102,  carbonate 17, BUN is 17, creatinine 1.1, glucose 101, and magnesium 2.4.  Once again TSH 1.354.  This admission BNP is 44.      Maple Mirza, PA      Maisie Fus C. Daleen Squibb, MD, [facility]  Electronically Signed    GM/MEDQ  D:  10/07/2007  T:  10/08/2007  Job:  [id number]   cc:   Thomas C. Daleen Squibb, MD, [facility]  Jonita Albee, M.D.

## 2010-06-25 NOTE — Assessment & Plan Note (Signed)
[facility]                            CARDIOLOGY OFFICE NOTE   NAME:LINNANEMosiah, Bastin                      MRN:          [id number]  DATE:11/16/2007                            DOB:          06-30-1940    Mr. Nanda comes in today for followup of his frequent ventricular  ectopy.  He was admitted to the hospital on October 05, 2007, for an  episode of fatigue, nausea, and presyncope.   During his hospital stay, he had frequent ventricular ectopy, but no  sustained V tach.  He had had a negative Myoview on May 06, 2007, that  showed no ischemia.  An echocardiogram was essentially normal except for  reduced ejection fraction, which was felt to be secondary to frequent  ectopy.  His left ventricular chamber size was normal.   Dr. Graciela Husbands saw him on rounds and recommended low-dose beta blockade.  I  put him on 50 mg of metoprolol b.i.d..  We also obtained a cardiac MRI  to rule out any infiltrative cardiomyopathy or significant scar.  This  was negative, and there was no evidence of RV dysplasia as well.  This  was read by Dr. Charlton Haws on October 12, 2007.   He says the beta-blocker makes him feel tired and worse.  He has not had  any presyncope, though he says when he checks his blood pressure  sometimes his heart rate is in the 40s.  I think it is because of  frequent PVCs.   Otherwise, he is totally asymptomatic.   On his exam, today he is in no acute distress.  HEENT is normal.  Blood  pressure is 118/80, his pulse was 90 and irregular.  His EKG shows  predominant PVCs that are unifocal, but there is one PVC identified in  V1 and V2, particularly that look like they may have a left bundle-  branch block pattern.  I reviewed this with Dr. Graciela Husbands.  His heart  reveals an irregular rate and rhythm.  Lungs are clear.  Abdominal exam  is soft.  Extremities with no cyanosis, clubbing, or edema.  Pulses are  intact.   I had a long talk with Mr.  Meikle and his wife today.  We will taper  the metoprolol over the next 10 days.  We will then place a 24-hour  Holter to quantitate his ectopy.  If he is running 20% or higher, we  will have Dr. Graciela Husbands see him in consultation.  It may be that ectopic  suppression with a drug like sotalol and/or potential ablation may help  reduce the risk of progressive cardiomyopathy.     Thomas C. Daleen Squibb, MD, [facility]  Electronically Signed    TCW/MedQ  DD: 11/16/2007  DT: 11/17/2007  Job #: [id number]   cc:   Jonita Albee, M.D.

## 2010-06-25 NOTE — Letter (Signed)
December 20, 2007    [name] [name]. [name] Squibb, MD, [facility]  [address]  [place], [place] [address]   RE:  [name] [name], [name] [name]  MRN:  [id number]  /  DOB:  23-May-1940   Dear [name] [name]:   It was a pleasure to see [name] [name] today at your request regarding  high-density ventricular ectopy in the setting of progressive left  ventricular dysfunction with increasing symptoms.   His ectopy is still a longstanding issue now and it has been primarily  monomorphic.  In the past, there have been few attributable  symptoms  and assessment of the left ventricular function is largely felt to be  normal to a degree that it could be interpreted with no irregularities.   Over recent months, however, he has noted increasing problems including  progressive fatigue, increasing dyspnea on exertion.   He does have occasional palpitations.   In your evaluation of the above, a Holter monitor was obtained, which we  could unfortunately not find today, but he says that you told him that  there are 40% PVCs.  Evaluation of the left ventricular function  included an MRI demonstrating ejection fraction of 40%.   He has had no syncope, but he does have episodes of lightheadedness.  He  also has episodes of abrupt fatigue that prompted him to pull over the  car and go to sleep.  We could not find the records.  He apparently has  been seen by sleep physicians, who felt not to have narcolepsy.   He does not have orthopnea, nocturnal dyspnea, or any significant  peripheral edema.   PAST MEDICAL HISTORY:  In addition to above is notable for arthritis,  but was notable most importantly for what was felt to be spontaneous  injections of his carotid artery and his renal arteries.  A question has  been raised as to whether he has underlying connective tissue disorder.   PAST SURGICAL HISTORY:  Notable for cholecystectomy, tonsillectomy,  cervical fusion, total right shoulder repair, umbilical hernia repair,  appendectomy, and nasal surgery.   REVIEW OF SYSTEMS:  In addition to above is notable for seasonal  allergies, GE reflux disease.   SOCIAL HISTORY:  He is married.  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  He is a retired Emergency planning/management officer.   FAMILY HISTORY:  Negative.   PHYSICAL EXAMINATION:  GENERAL:  He is an older Caucasian male,  appearing his stated age of 25.  VITAL SIGNS:  His weight is 246 pounds down 15 pounds in the last 6  months, down 20 pounds in the last 2 years, his blood pressure is 131/60  with a pulse of 63 without orthostatic change.  Numbers are well  recorded, showing an increase in heart rate likely related to  intermittent ventricular ectopy.  HEENT:  Demonstrated no icterus or xanthoma.  NECK:  His veins are flat.  The carotids are brisk and full bilaterally  without bruits.  BACK:  Without kyphosis or scoliosis.  LUNGS:  Clear.  HEART:  Sounds are irregular with a 2/6 murmur.  ABDOMEN:  Soft with active bowel sounds and protuberant.  EXTREMITIES:  Femoral pulses are 2+.  Distal pulses were intact.  There  is no clubbing, cyanosis, or edema.  NEUROLOGIC:  Grossly normal.  SKIN:  Warm and dry.   Two electrocardiograms were reviewed, the one from November 16, 2007,  demonstrated sinus rhythm with interpolated PVCs as well as a series of  couplets  which are I think ventricular in origin.  The first of which  has a right bundle configuration.  The second of which has a terminal S-  waves into the left bundle configuration.  Transitions are between V1  and V2.   Electrocardiogram from March demonstrates a monomorphic PVC with a right  bundle-branch intermediate-axis morphology.  The QRS of the PVCs, I  should note is quite narrow at approximately 120 msec or so.  The  transitions also in lead V2.   IMPRESSION:  1. Premature ventricular contractions with a dominant right bundle      early transitions upon intermediate access of a second morphology       with a terminal S-wave and a superior axis.  2. Possible progressive left ventricular dysfunction and symptoms of      increasing symptoms of fatigue and shortness of breath attributable      to premature ventricular contractions.  3. History of spontaneous arterial dissection.  4. Sleep disturbance.  5. Obesity with intercurrent 20-pound weight loss.   DISCUSSION:  [name] [name], [name] [name] has apparently a very high burden of  ventricular ectopy which is largely asymptomatic from being now  translating into left ventricular dysfunction and associated symptoms of  fatigue and dyspnea.  The QRS morphologies of the beats and the  narrowness of the QRS suggested they may be septal in origin activating  rapidly through the conduction system given the QRS is narrow.  Given  [name] [name] history of arterial dissections, he is somewhat reluctant  to consider intra-arterial procedures which are all none urgent and I  can share with him his interpretation.   I thought I would review with some our colleagues, the PVC morphology  and estimates in location and the possibly an assessment of right-sided  versus left-sided approaches and catheter ablation.  However, the other  strategies that we discussed and I think you would confer would be one  of using antiarrhythmic drug initially to try and suppress ventricular  ectopy in the hope of being able to demonstrate with certainty that  myopathy is secondary to the symptoms.  I told them I would review these  with colleagues and get back with him in the next week or two.  Thank  you very much for letting me see him.   We have had some input from the team at [facility] who also feel that the  origin of these may be exceptionally close to the his bundle resulting  in a significant risk to his conduction system at the time of ablation--  this would support our approach of a medical therapy trial    Sincerely,      [name] Salvia, MD, [facility]  Electronically  Signed    SCK/MedQ  DD: 12/20/2007  DT: 12/21/2007  Job #: [id number]   CC:    [name] [name], M.D.

## 2010-06-25 NOTE — Assessment & Plan Note (Signed)
[facility]                            CARDIOLOGY OFFICE NOTE   NAME:LINNANEMohammedali, [name]                      MRN:          [id number]  DATE:04/28/2007                            DOB:          [birth year]    I was asked by Dr. Robert Bellow to consult on [name] [name], a  delightful 70 year old gentleman that I met actually in 2001, at [facility] when he spontaneously dissected a right  renal artery.   He came to see Dr. Perrin Maltese the other day and had some T-wave and ST-  segment changes in his inferior leads.  He also had increased  ventricular ectopy which is an old problem.   Dr. Perrin Maltese called me and asked me if I thought we needed to consider  coronary disease and/or any other issues.   Of interest, he had a spontaneous left carotid dissection probably close  to 15 years ago.  At the time, he was seen by an expert at [facility] at [facility] and told that he might have a connective  tissue disease.  It was a borderline call.  When he had his right renal  artery dissection, we also entertained this diagnosis.  Dr. Liliane Bade,  of the vascular surgery group, consulted with [place].  Carotid Dopplers at  that time were normal.  He had a focal dissection involving the inferior  lobar branch of the right renal artery.  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  He also had a weblike  stenosis in accessory left renal artery supplying the upper pole of the  kidney.  There was no atherosclerotic disease within the aorta or the  iliac arteries.  His iliac arteries were noted to be torturous, however.  His sedimentation rate was normal.  I do not have the old chart for any  other records.  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  He had been on Coumadin prior to that admission and this was  discontinued.  He was  placed on aspirin at that time.  Echocardiogram  done at that time was also normal.   He has done well over the last 6-7 years.   He denies any symptoms of ischemic heart disease except for dyspnea on  exertion.  He has significant knee problems and is unable to really do a  lot of exertion in general.   ALLERGIES:  History of DYE ALLERGY with an itchy scalp.  He is  intolerant of MORPHINE, PERCOCET AND PENICILLIN.  He has a SHELLFISH  allergy.   SOCIAL HISTORY:  He has smoked in the past, but quit 25 years ago.  He  has not used any recreational products or drugs.  He drinks about 1-2  alcoholic beverages a day.   CURRENT MEDICATIONS:  1. Lisinopril/HCTZ 20/12.5 daily.  2. Vitamin D.  3. Ecotrin 325 mg a day.  4. Vitamin C.  5. Multivitamin.  6. Nasal spray in the form of the Nasonex.  PAST MEDICAL HISTORY:  History of hypertension.  His lipids were drawn  yesterday by Dr. Perrin Maltese and he says that his lipids are going up.  Looking back in his chart, he had an LDL of 110 in March 2007.  His HDL  cholesterol was 49, total cholesterol was 177, triglycerides were  normal.  Hemoglobin A1c was normal.   PAST SURGICAL HISTORY:  1. Cholecystectomy.  2. Tonsillectomy.  3. Cervical fusion.  4. Total right shoulder.  5. Umbilical hernia repair.  6. Appendectomy.  7. Nasal surgery.   SOCIAL HISTORY:  He is an ex-Marine.  He has three children.  He is  married.  He is a retired Emergency planning/management officer.   REVIEW OF SYSTEMS:  Other than seasonal allergies, history  gastroesophageal reflux and arthritis negative.   FAMILY HISTORY:  Left out, but is not contributory.   PHYSICAL EXAMINATION:  GENERAL:  He is a very pleasant gentleman.  VITAL SIGNS:  Blood pressure 132/85, his pulse was 60 and slightly  irregular.  His EKG shows sinus rhythm with some ST-segment flattening,  T-wave inversion in inferior leads as well as his lateral leads with an  occasional PVC.  He is 5 feet 10  inches, weight 258 pounds.  HEENT:  Normocephalic, atraumatic.  PERRL.  Extraocular is intact.  Sclerae are clear.  Facial asymmetry normal.  NECK:  Carotid upstrokes bilaterally with a right soft carotid bruit.  Thyroid is not enlarged.  Trachea is midline.  Supple.  LUNGS:  Clear.  HEART:  A poorly appreciated PMI.  He has a big barrel chest.  Normal S1  and S2 without obvious murmur or gallop.  ABDOMEN:  Protuberant, good bowel sounds.  There was no obvious midline  or flank bruit.  Organomegaly was not detected, but again difficult to  evaluate.  EXTREMITIES:  No edema.  Pulses were 2+/4+ and bilaterally symmetrical.  NEUROLOGIC:  Intact.  There is no sign of DVT.  SKIN:  Unremarkable.   ASSESSMENT/PLAN:  [name] [name] new EKG changes are concerning for  potential coronary ischemia.  I do not think he has had another  dissection or we probably would have had a blatant infarct.  He also has  a right carotid bruit that is probably new.   I have had a long talk with him today.  I put a phone call in to Dr.  Jonette Pesa at [facility].  He will be in touch with  Dr. Carylon Perches, who spent his whole career studying connective  tissue disorders particularly of the vascular systems.  In the meantime,  we will obtain carotid Dopplers, 2-D echocardiogram to rule out any  significant LV dysfunction or infarct as well as an adenosine rest  stress Myoview.   I will have him return in a couple weeks to discuss this and answer any  questions.  If there is something that will be of any clinical use at  [facility], I would certainly consider referring him down there.     Thomas C. Daleen Squibb, MD, [facility]  Electronically Signed    TCW/MedQ  DD: 04/28/2007  DT: 04/29/2007  Job #: [id number]   cc:   Jonita Albee, M.D.

## 2010-06-25 NOTE — Procedures (Signed)
NAME:  [name] [name], [name] [name] NO.:  [id number]   MEDICAL RECORD NO.:  [id number]          PATIENT TYPE:  OUT   LOCATION:  SLEEP CENTER                 FACILITY:  [facility]   PHYSICIAN:  Barbaraann Share, MD,FCCPDATE OF BIRTH:  03/03/42   DATE OF STUDY:  03/07/2008                            NOCTURNAL POLYSOMNOGRAM   REFERRING PHYSICIAN:  Duke Salvia, MD, [facility]   INDICATION FOR STUDY:  Hypersomnia with sleep apnea.   EPWORTH SLEEPINESS SCORE:  12.   MEDICATIONS:   SLEEP ARCHITECTURE:  The patient had a total sleep time of only 194  minutes with no slow-wave sleep and only 48 minutes of REM.  Sleep onset  latency was normal at 14 minutes and REM onset was normal at 77 minutes.  Sleep efficiency was extremely poor at 55%.   RESPIRATORY DATA:  The patient was found to have 3 apneas and 15  hypopneas for an apnea-hypopnea index of 6 events per hour.  He was also  found to have 31 respiratory effort-related arousals, giving him a  respiratory disturbance index of 15 events per hour.  The events were  not positional.  There was moderate-to-loud snoring noted throughout.   OXYGEN DATA:  There was transient O2 desaturation as low as 87% with the  patient's obstructive events.   CARDIAC DATA:  Frequent PVCs noted throughout.   MOVEMENT-PARASOMNIA:  The patient was found to have no significant leg  jerks or abnormal behaviors.   IMPRESSIONS-RECOMMENDATIONS:  1. Very mild obstructive sleep apnea with an apnea-hypopnea index of 6      events per hour, and a respiratory disturbance index of 15 events      per hour.  There was oxygen desaturation as low as 87%.  Treatment      for this degree of sleep apnea can include weight loss alone if      applicable, but also more aggressive treatment such as      dental appliance, surgery, and continuous positive airway pressure      if the patient is significantly symptomatic.  2. Frequent premature ventricular contractions noted  throughout.      Barbaraann Share, MD,FCCP  Diplomate, American Board of Sleep  Medicine  Electronically Signed     [facility]MC/MEDQ  D:  03/11/2008 15:02:47  T:  03/12/2008 03:57:32  Job:  [id number]

## 2010-06-25 NOTE — Assessment & Plan Note (Signed)
[facility]                            CARDIOLOGY OFFICE NOTE   NAME:LINNANEReg, Bircher                      MRN:          [id number]  DATE:05/18/2007                            DOB:          December 31, 1940    I had the pleasure of seeing Sai Zinn back in the office today.  He was seen initially by me on April 28, 2007, for an abnormal EKG  performed by Dr. Robert Bellow.   He has a long history of asymptomatic ventricular ectopy.  He has some  cardiac risk factors.  He has had previous spontaneous dissection of the  left carotid and also of right renal.  He has no history of  atherosclerosis.  Please see my note for details.   We performed an adenosine rest/stress Myoview which showed no evidence  of coronary ischemia or infarction.  His EF could not be calculated  because of frequent ventricular ectopy.  A 2-D echocardiogram showed  normal left ventricular chamber size, minimal left ventricular  hypertrophy (has a history of hypertension which is under good control  today), normal right-sided structures and function.  No significant  valvular disease and his EF was calculated around 40% because of  frequent ventricular ectopy with septal dyssynergy.  I think this is  falsely low.  There was also a suggestion of some diastolic dysfunction,  but again very difficult to assess in the setting of ventricular ectopy.  He clearly has some mild LVH from his hypertension.   Also, for her right carotid bruit obtained carotid Dopplers.  There was  no evidence of any obstructive plaque present.   I have reviewed all these findings with Mr. Mostafa and his wife.  We  reviewed his EKG today, which is stable.  At this point in time I do not  think there is anything else that we need to potentially do.   I think the most important a this time is primary prevention with blood  pressure control as well as weight reduction and stenting as active as  possible.  I  have told him to stay on his  lisinopril/hydrochlorothiazide.  He is also on aspirin 325 mg a day.  Will see him back p.r.n.     Thomas C. Daleen Squibb, MD, [facility]  Electronically Signed    TCW/MedQ  DD: 05/18/2007  DT: 05/18/2007  Job #: [id number]   cc:   Jonita Albee, M.D.

## 2010-06-25 NOTE — Letter (Signed)
April 19, 2008    [name] [name], M.D.  [facility]  [address]  [place], [place] [address]   RE:  [name] [name], [name] [name]  MRN:  [id number]  /  DOB:  [birth year]   Dear [name] [name],   [name] [name] came in today in followup for his PVCs and cardiomyopathy.  Recent Holter demonstrated decrease in his PVCs from about 30,000 to  20,000.  This has been concurrent with him feeling good deal better on  the verapamil at the lower dose of 120 b.i.d.  Interestingly, his  ejection fraction from MR went from 40% to more recently 55% by echo.  Again, he feels overall a fair amount better.   Unfortunately, he is also having problems with chest pain.  These go  back some period of time.  He thinks that what he is having now is the  same as he had a year ago, only more intense and more frequent.  These  episodes are described as lasting seconds to minutes.  They are not  exertional.  They are typical crescendo.  They are midsternal.  He did  have a Myoview a year ago, which demonstrated no significant ischemia.  It was not gated because of his ectopy.   I reviewed with him again and this is for the record.  He has a history  of arterial dissections.  It started in 1993 or so when he developed  headaches and he was found to have a dissection in his carotid artery.  He was referred to [facility]  When he was decided that he would continue on  Coumadin, this eventually healed after some years.   He then had an episode 7 years later in 2001 when he developed abdominal  pain and infarcted his kidney associated with what was thought to be  dissection of the renal artery.  Because of that, there has been a great  deal of reluctance to be invasive as relates to his PVCs, but I should  also note that he underwent renal angiogram at that time and has also  undergone cranial angiography without untoward effect.   His medications currently include Ecotrin 325, lisinopril HCT 25/12.5,  and verapamil 120  b.i.d.   PHYSICAL EXAMINATION:  VITAL SIGNS:  His blood pressure was 119/80, his  pulse was 66, and his weight was 246.  NECK:  Veins were flat.  LUNGS:  Clear.  HEART:  Sounds were regular.  CHEST:  No chest wall tenderness.  ABDOMEN:  Soft.  EXTREMITIES:  Without edema.   Holter monitor was as noted above.   IMPRESSION:  1. Atypical chest pain.  2. Frequent ventricular ectopy as noted above, now 20,000 beats per      day.  3. Cardiomyopathy with intercurrent improvement in left ventricular      systolic function.  4. History of spontaneous dissection of this carotid artery and renal      artery.   [name] [name], [name] [name]'s chest pain certainly sounds atypical.  The concern  obviously though was that it maybe his heart.  I am reluctant to jump in  and do an angiogram given his history of arterial dissections and so we  will apply for approval for CT angiogram.  His calcium score is very  low, then I think we do not need to pursue it any further.   I am exceedingly pleased that his symptoms related to his PVCs are  better and that his ejection fraction is better.  We will continue on  his current dose of verapamil.  The chest pain in the setting of  verapamil raises the possibility that, with its potential impact on the  lower esophageal sphincter tone,  whether he is having more reflux.  To  address that we are going to give a 1 month trial of over-the-counter H2  blocker or PPI.  He will let us know how he does.  In the event that this actually is  helpful, we will need to ask the question about what we do with his  verapamil dosing.   We will plan to see him again in 3 months' time.    Sincerely,      [name] Salvia, MD, [facility]  Electronically Signed    SCK/MedQ  DD: 04/19/2008  DT: 04/20/2008  Job #: [id number]

## 2010-06-28 NOTE — Discharge Summary (Signed)
[facility]  Patient:    [name] [name], [name] [name]                      MRN: [id number] Adm. Date:  [id number] Attending:  Mirian Mo Dictator:   Delton See, P.A. CC:         Genene Churn. Love, M.D./Christian Leta Jungling, M.D.  Denman George, M.D./Christopher Perrin Maltese, M.D.  Veneda Melter, M.D. [facility]   Discharge Summary  HISTORY OF PRESENT ILLNESS: This patient is a 70 year old male with a history of left carotid dissection, treated with Coumadin therapy, degenerative joint disease, and no known cardiac history, who developed a right-sided headache on October 09, 1999 while eating breakfast.  He presented to his primary M.D.s office (Dr. Mila Homer) and in the office he developed right upper quadrant pain.  He had a CT scan performed that revealed a right renal infarct.  The patient was sent to [facility] for further evaluation.  He also was noted to have Hemoccult positive stool at that time.  He denied any recent TIA-like symptoms, denied any chest discomfort, paroxysmal nocturnal dyspnea, or orthopnea.  The patient denied any recent rectal bleeding.  He believes he may have had one episode of painless hematuria in the last few weeks.  He was still having right upper quadrant pain.  Dr. Jamey Ripa was consulted to see the patient, and he was admitted by Dr. Daleen Squibb.  PAST MEDICAL HISTORY:  1. The patient had a left carotid dissection of unknown etiology, treated     with Coumadin.  2. Status post cholecystectomy.  3. Status post appendectomy.  4. Status post umbilical hernia repair.  ALLERGIES:  1. PENICILLIN caused hives.  2. MORPHINE caused nausea and vomiting.  3. PERCOCET caused nausea and vomiting.  4. SHRIMP.  ADMISSION MEDICATIONS:  1. Coumadin, dosage not available.  2. Celebrex, dosage not available.  3. Nasonex, dosage not available.  SOCIAL HISTORY: The patient quit smoking 15 years ago.  He does not  use alcohol.  HOSPITAL COURSE: As noted, this patient was admitted through the emergency room at [facility] by Dr. Daleen Squibb for a right renal infarct of uncertain etiology.  He was noted to have Hemoccult positive stool at the time of admission.  Hemoglobin on admission was 15.1, hematocrit 40.5.  The patient was seen in consultation by Dr. Arlyce Dice secondary to the guaiac positive stool.  The patient had no overt GI bleeding, no evidence for ischemic bowel on CT, and no GI intervention was planned for the immediate future.  The patient was started on Protonix and it was felt he would eventually need a full GI work-up, although this was deferred for the time being.  A 2D echocardiogram was performed on October 09, 1999 and this revealed overall normal left ventricular systolic function.  The ejection fraction was estimated to be 55-65%.  The left ventricular wall thickness was mildly increased and the right ventricular size was at the upper limits of normal.  The patient was seen in consultation by Dr. Madilyn Fireman for the cardiovascular and thoracic surgeons.  Dr. Madilyn Fireman thought that the patient may have an underlying pathological vascular condition.  He recommended carotid Dopplers as well as a renal arteriogram.  It was also felt that the patient may need an MRA of the carotids.  The patient had a renal angiogram performed on October 11, 1999 and this revealed a focal dissection involving the inferior lobar branch of the right  renal artery.  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  The kidney supplied by this artery did not enhance, in a nephrographic phase and there was static flow within the intralobar branches consistent with an infarct identified on a previous CT scan.  There was also web-like stenosis in the accessory left renal artery supplying the upper pole. This stenosis was near the bifurcation of this vessel.  There was no evidence of  atherosclerotic disease within the aorta or the iliac arteries.  The iliac arteries were noted to be tortuous.  It was felt that the underlying cause for the abnormality identified in the right inferior lobar renal artery and accessory left renal artery could be explained on the basis of one of the left common subtypes of fibromuscular dysplasia or an intrinsic arteriopathy, or arteritis.  The results of the procedure were discussed with Dr. Chales Abrahams.  Carotid Dopplers were performed on October 10, 1999 and this showed no internal carotid artery stenosis bilaterally, and vertebral artery flow was antegrade.  It was eventually felt that long-term Coumadin was not indicated for this patient and long-term Plavix was recommended.  GI continued to follow the patient throughout his stay, and outpatient GI work-up was to be performed at a later date including upper endoscopy and colonoscopy.  The patient did receive a transfusion during his stay.  Dr. Chales Abrahams reviewed the patients case on October 11, 1999 and he did not feel that the patient had a connective tissue disorder as he had a normal sedimentation rate.  He did not recommend steroids or anticoagulation with Coumadin.  As noted, he did recommend aspirin and Plavix long-term.  It was felt that the patient would also benefit from a Captopril renogram in two to four weeks to assess renal function to see if there was any viability noted in the right kidney.  He also would consider a stent for treatment of the aneurysm and dissection.  Arrangements were made for the patient to follow up with Dr. Chales Abrahams in two to three weeks.  The patient was discharged on improved condition on October 12, 1999.  LABORATORY DATA: Basic metabolic panel on October 11, 1999 revealed a BUN of 9, creatinine 1.3.  Potassium was 3.4, calcium 7.7.  INR on October 11, 1999 was 1.9, PTT 43.  Hemoglobin was 12.1, hematocrit 32.5.  Hepatitis B surface  antigen was negative.   Sedimentation rate was initially 4 and repeat was 8. On admission INR was 2.2, the patient on Coumadin at that time.  Glucose levels were mildly elevated in the 130-150 range.  Total bilirubin was mildly elevated at 1.5.  Initial troponin enzyme was negative.  TSH was 1.142.  Please see results of renal arteriogram as noted above.  Please see results of carotid Dopplers as noted above.  EKG on October 11, 1999 showed sinus bradycardia with nonspecific T wave abnormalities.  DISCHARGE MEDICATIONS:  1. Plavix 75 mg q.d.  2. Aspirin 325 mg q.d.  3. Protonix 40 mg q.d.  FOLLOW-UP: As noted, the patient is to follow up with Dr. Chales Abrahams in three to four weeks, Dr. Madilyn Fireman as instructed, Dr. Perrin Maltese as-needed or as scheduled.  DISCHARGE ACTIVITY: The patient was told to avoid any strenuous activity for 72 hours.  DISCHARGE DIET: He was to remain on his regular diet.  DISCHARGE PROBLEM LIST:  1. Status post right renal infarct of uncertain etiology.  2. History of Coumadin therapy, discontinued this admission.  3. History of  left carotid dissection.  4. Hemoccult positive stool, with outpatient gastrointestinal work-up.  5. Questionable history of hematuria.  6. Echocardiogram performed on October 09, 1999 essentially normal.  7. Mild hypokalemia.  8. Multiple studies as described above. DD:  1999-11-08 TD:  11-08-99 Job: [id number]

## 2010-06-28 NOTE — Consult Note (Signed)
[facility]  Patient:    LYNDELL, ALLAIRE                      MRN: [id number] Proc. Date: 10/09/99 Attending:  Currie Paris, M.D. CC:         [name]. Wall, M.D. [facility]   Consultation Report  REASON FOR CONSULTATION:  Abdominal pain.  HISTORY OF PRESENT ILLNESS:  Mr. Cheek is a 70 year old generally healthy gentleman, who has a known problem with a left carotid dissection, which apparently has never healed since it was first diagnosed five years ago, and he remains anticoagulated on Coumadin.  This morning he developed around 8:30 a right-sided headache that was unusual for him and reasonably severe.  The headache went away and was followed by severe, excruciating abdominal pain, which was predominantly right midlateral abdomen.  It was associated with diaphoresis and vomiting.  He was seen by Dr. Robert Bellow, and his labs at that time were basically unremarkable, with a hemoglobin of 13.5 and a normal white count, apparently a negative urinalysis, but he did have what appeared to be a maroon stool.  At first they thought this might be a kidney stone and a CT was obtained, and a full CT with oral and IV contrast showed what appeared to be a partial right renal infarct.  The patient was then brought to the emergency room for further evaluation and is being seen by multiple consultants including myself, Dr. Valera Castle, Dr. Denman George, and Dr. Melvia Heaps.  PAST MEDICAL HISTORY:  Remarkable in that he has had several abdominal procedures, including open cholecystectomy, open appendectomy, and as an infant an umbilical hernia repair.  He has had a right shoulder replacement. He has had anterior cervical fusion.  Details of his past history are noted on the other admission notes and not redictated on this one, although I have reviewed these.  MEDICATIONS:  Medications in addition to the Coumadin are Celebrex.  ALLERGIES:  He apparently  has some allergy-type problems to MORPHINE and cannot take that.  PHYSICAL EXAMINATION:  GENERAL:  The patient is awake, alert, and a little uncomfortable but not in acute distress.  HEENT:  His head is normocephalic.  The eyes are nonicteric.  The pupils appear to be round.  NECK:  Supple.  There are no masses or thyromegaly.  A well-healed cervical scar.  CHEST:  Clear to auscultation.  HEART:  Regular with no murmurs, rubs, or gallops.  ABDOMEN:  The abdomen has a little tenderness deep in the right flank area over the right kidney but is basically a benign abdomen.  Scars are well-healed.  Bowel sounds are present.  RECTAL:  I did not repeat the rectal.  EXTREMITIES:  Pulses intact and no evidence of ischemic changes.  LABORATORY DATA:  Laboratory studies at [facility] include a slightly elevated glucose of 129, SGOT of 54, and total bilirubin 1.5.  The _____ is otherwise normal.  His amylase is normal at 69.  His INR is 2.2.  His white count is 11,700, and his hemoglobin is 15.1.  I have reviewed his CT scan both with Dr. Perrin Maltese and Dr. Fredia Sorrow and Dr. Daleen Squibb, and he does appear to have a right renal infarct, which is acute.  There is no significant other vascular disease that is visible at least by CT scan.  IMPRESSION: 1. 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 2. History of left carotid dissection. 3.  Anticoagulation.  RECOMMENDATION:  At this point, he has some vascular problem of which I am not sure of the etiology.  There is no obvious source for an embolus.  He does not appear to have widened aortic area on chest x-ray, nor does he appear to have peripheral disease by physical or by CT evidence.  In addition, it would be somewhat unusual to have an embolus on adequate anticoagulation therapy. Other possibilities include some intrinsic vascular disease such as _____ necrosis or fibromuscular dysplasia.  It is also possible he has some ischemic  phenomenon going on in his GI tract similar to what has happened to his kidney, although at least clinically he does not appear to have an acute abdomen, with no diffuse abdominal pain and no tenderness and soft, nondistended abdomen.  PLAN:  At this point, we are going to await further evaluation and echocardiogram as well as probable angiogram.  He looks a little bit volume- depleted, and his hemoglobin is a little bit higher now than it was earlier, so we are rehydrating him and plan to give him a little bit of fresh frozen plasma so that he can safely have an angiogram.  He will at some point probably need some GI evaluation, but I do not at this point see an acute abdomen problem that would require surgical intervention. DD:  10/09/99 TD:  10/10/99 Job: [id number]

## 2010-07-16 ENCOUNTER — Other Ambulatory Visit: Payer: Self-pay | Admitting: Internal Medicine

## 2010-07-16 DIAGNOSIS — M542 Cervicalgia: Secondary | ICD-10-CM

## 2010-07-23 ENCOUNTER — Ambulatory Visit
Admission: RE | Admit: 2010-07-23 | Discharge: 2010-07-23 | Disposition: A | Discharge status: Home / Self Care | Payer: Medicare Other | Source: Ambulatory Visit | Attending: Internal Medicine | Admitting: Internal Medicine

## 2010-07-23 DIAGNOSIS — M542 Cervicalgia: Secondary | ICD-10-CM

## 2010-10-25 ENCOUNTER — Other Ambulatory Visit: Payer: Self-pay | Admitting: Internal Medicine

## 2010-11-12 LAB — POCT I-STAT, CHEM 8
BUN: 19
Calcium, Ion: 1.15
Chloride: 105
Creatinine, Ser: 0.9
Glucose, Bld: 105 — ABNORMAL HIGH
HCT: 40
Hemoglobin: 13.6
Potassium: 3.5
Sodium: 141
TCO2: 27

## 2010-11-12 LAB — DIFFERENTIAL
Basophils Absolute: 0
Basophils Relative: 1
Eosinophils Absolute: 0.1
Eosinophils Relative: 2
Lymphocytes Relative: 26
Monocytes Absolute: 0.6

## 2010-11-12 LAB — POCT CARDIAC MARKERS
CKMB, poc: 1.3
Myoglobin, poc: 64.1
Troponin i, poc: <0.05

## 2010-11-12 LAB — CBC
HCT: 41.2
MCHC: 33.8
MCV: 92.3
Platelets: 171
RDW: 13

## 2010-12-06 ENCOUNTER — Ambulatory Visit
Admission: RE | Admit: 2010-12-06 | Discharge: 2010-12-06 | Disposition: A | Discharge status: Home / Self Care | Payer: Medicare Other | Source: Ambulatory Visit | Attending: Orthopedic Surgery | Admitting: Orthopedic Surgery

## 2010-12-06 ENCOUNTER — Encounter: Payer: Self-pay | Admitting: *Deleted

## 2010-12-06 ENCOUNTER — Other Ambulatory Visit: Payer: Self-pay | Admitting: Orthopedic Surgery

## 2010-12-06 DIAGNOSIS — R52 Pain, unspecified: Secondary | ICD-10-CM

## 2010-12-11 ENCOUNTER — Ambulatory Visit (INDEPENDENT_AMBULATORY_CARE_PROVIDER_SITE_OTHER): Payer: Medicare Other | Admitting: Cardiology

## 2010-12-11 ENCOUNTER — Encounter: Payer: Self-pay | Admitting: Cardiology

## 2010-12-11 VITALS — BP 120/72 | HR 75 | Ht 70.5 in | Wt 237.0 lb

## 2010-12-11 DIAGNOSIS — R0789 Other chest pain: Secondary | ICD-10-CM

## 2010-12-11 DIAGNOSIS — I7779 Dissection of other artery: Secondary | ICD-10-CM

## 2010-12-11 DIAGNOSIS — G4733 Obstructive sleep apnea (adult) (pediatric): Secondary | ICD-10-CM

## 2010-12-11 DIAGNOSIS — I10 Essential (primary) hypertension: Secondary | ICD-10-CM

## 2010-12-11 DIAGNOSIS — I4949 Other premature depolarization: Secondary | ICD-10-CM

## 2010-12-11 NOTE — Assessment & Plan Note (Signed)
He has a history of ventricular ectopy that has been symptomatic but benign. I do not think this precludes surgical clearance. We'll obtain 2-D echocardiogram to assess left ventricular systolic function. This is relatively normal or normal, I will clear him at low operative risk. If his pain stays tolerable he may decide not to have the surgery.

## 2010-12-11 NOTE — Assessment & Plan Note (Signed)
These events were spontaneous and he has no obstructive vascular disease by angiography in the past. Is not having any symptoms of angina or cardiac ischemia or cerebral ischemia. His blood pressures under good control. I think we can clear him for surgery at low operative risk.

## 2010-12-11 NOTE — Progress Notes (Signed)
HPI Mr [name] [name] in today for preoperative clearance for right shoulder surgery. He has a history of of an injury in 1969 where he had a metal plate inserted. A screw is now loose and he has had severe pain. It has gotten better recently but still he is very concerned. He is evaluated by Dr. Ave Filter of orthopedics in the [employer] is also asking him to get an opinion from the [place].   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 He does not have a history of coronary disease. He's had a benign evaluation the past.  He denies any angina or ischemic symptoms. He still has intermittent palpitations but no presyncope or syncope. He denies orthopnea, PND or edema. He  Is still fairly active. He has limited use of his right arm which as been that way since 1969.    EKG today shows normal sinus rhythm with PVCs and nonspecific ST segment changes. Past Medical History  Diagnosis Date  . HTN (hypertension)   . PVC (premature ventricular contraction)   . History of agent Orange exposure   . Retinopathy     related to PVC's  . Dissection of carotid artery     history  . Dissection of renal artery     history  . GERD (gastroesophageal reflux disease)   . History of hepatitis 1969    [place], unknown what type  . Anal fissure   . Anxiety disorder   . Arthritis   . Asthma   . Adenomatous colon polyp   . Diverticulosis   . Gallstones   . Cardiomyopathy     Past Surgical History  Procedure Date  . Appendectomy 1963  . Cervical fusion 1985  . Cholecystectomy 1990  . Tonsillectomy 1965  . Hernia repair   . Carotid disection   . Right renal artery disection     Family History  Problem Relation Age of Onset  . Heart disease Father   . Colon cancer      uncle    History   Social History  . Marital Status: Married    Spouse Name: N/A    Number of Children: 3  . Years of Education: N/A   Occupational  History  . 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 Smoking status: Former Games developer  . Smokeless tobacco: Not on file  . Alcohol Use: Yes     4-5 daily  . Drug Use: No  . Sexually Active: Not on file   Other Topics Concern  . Not on file   Social History Narrative  . No narrative on file    Allergies  Allergen Reactions  . Morphine   . Oxycodone-Acetaminophen   . Penicillins     Current Outpatient Prescriptions  Medication Sig Dispense Refill  . acetaminophen (TYLENOL) 650 MG CR tablet Take 650 mg by mouth as needed.        . ALPRAZolam (XANAX) 0.25 MG tablet Take 0.25 mg by mouth 3 (three) times daily as needed.        . Ascorbic Acid (VITAMIN C) 1000 MG tablet Take 1,000 mg by mouth daily.        Marland Kitchen aspirin 325 MG EC tablet Take 162.5 mg by mouth daily.       . cholecalciferol (VITAMIN D) 1000 UNITS tablet Take 1,000 Units by mouth daily.        Marland Kitchen  fluticasone (FLONASE) 50 MCG/ACT nasal spray Place 2 sprays into the nose daily.        Marland Kitchen HYDROcodone-acetaminophen (NORCO) 5-325 MG per tablet Take 1 tablet by mouth as needed.        Marland Kitchen lisinopril (PRINIVIL,ZESTRIL) 20 MG tablet Take 40 mg by mouth daily.       . naproxen sodium (ANAPROX) 220 MG tablet Take 220 mg by mouth as needed.        Marland Kitchen omeprazole (PRILOSEC) 40 MG capsule Take 40 mg by mouth daily.        . traMADol (ULTRAM-ER) 200 MG 24 hr tablet Take 200 mg by mouth daily.        . verapamil (CALAN-SR) 120 MG CR tablet take 1 tablet by mouth once daily  30 tablet  6    ROS Negative other than HPI.   PE General Appearance: well developed, well nourished in no acute distress HEENT: symmetrical face, PERRLA, good dentition  Neck: no JVD, thyromegaly, or adenopathy, trachea midline Chest: symmetric without deformity Cardiac: PMI non-displaced, RRR, normal S1, S2, no gallop or murmur Lung: clear to ausculation and percussion Vascular: all pulses full without bruits  Abdominal:  nondistended, nontender, good bowel sounds, no HSM, no bruits Extremities: no cyanosis, clubbing or edema, no sign of DVT, no varicosities  Skin: normal color, no rashes Neuro: alert and oriented x 3, non-focal Pysch: normal affect Filed Vitals:   12/11/10 1537  BP: 120/72  Pulse: 75  Height: 5' 10.5" (1.791 m)  Weight: 237 lb (107.502 kg)    EKG Normal sinus rhythm, PVCs, nonspecific ST segment changes. Labs and Studies Reviewed.   Lab Results  Component Value Date   WBC 7.6 09/04/2009   HGB 15.4 09/04/2009   HCT 44.9 09/04/2009   MCV 92.9 09/04/2009   PLT 178 09/04/2009      Chemistry      Component Value Date/Time   NA 137 09/04/2009 0850   K 4.4 09/04/2009 0850   CL 103 09/04/2009 0850   CO2 26 09/04/2009 0850   BUN 19 09/04/2009 0850   CREATININE 0.83 09/04/2009 0850      Component Value Date/Time   CALCIUM 8.8 09/04/2009 0850   ALKPHOS 56 09/04/2009 0918   AST 28 09/04/2009 0918   ALT 24 09/04/2009 0918   BILITOT 1.6* 09/04/2009 0918       No results found for this basename: CHOL   No results found for this basename: HDL   No results found for this basename: LDLCALC   No results found for this basename: TRIG   No results found for this basename: CHOLHDL   No results found for this basename: HGBA1C   Lab Results  Component Value Date   ALT 24 09/04/2009   AST 28 09/04/2009   ALKPHOS 56 09/04/2009   BILITOT 1.6* 09/04/2009   No results found for this basename: TSH  One

## 2010-12-11 NOTE — Patient Instructions (Signed)
Your physician has requested that you have an echocardiogram. Echocardiography is a painless test that uses sound waves to create images of your heart. It provides your doctor with information about the size and shape of your heart and how well your heart's chambers and valves are working. This procedure takes approximately one hour. There are no restrictions for this procedure.  Your physician wants you to follow-up in: 1 year with Dr. Daleen Squibb. You will receive a reminder letter in the mail two months in advance. If you don't receive a letter, please call our office to schedule the follow-up appointment.

## 2010-12-17 ENCOUNTER — Ambulatory Visit (HOSPITAL_COMMUNITY): Payer: Medicare Other | Attending: Cardiology | Admitting: Radiology

## 2010-12-17 DIAGNOSIS — I079 Rheumatic tricuspid valve disease, unspecified: Secondary | ICD-10-CM | POA: Insufficient documentation

## 2010-12-17 DIAGNOSIS — I379 Nonrheumatic pulmonary valve disorder, unspecified: Secondary | ICD-10-CM | POA: Insufficient documentation

## 2010-12-17 DIAGNOSIS — I4949 Other premature depolarization: Secondary | ICD-10-CM

## 2010-12-17 DIAGNOSIS — I517 Cardiomegaly: Secondary | ICD-10-CM

## 2010-12-17 DIAGNOSIS — I10 Essential (primary) hypertension: Secondary | ICD-10-CM | POA: Insufficient documentation

## 2010-12-25 ENCOUNTER — Telehealth: Payer: Self-pay | Admitting: *Deleted

## 2010-12-25 NOTE — Telephone Encounter (Signed)
I spoke with pt wife and pt has not scheduled shoulder surgery at this time.   Mylo Red RN

## 2011-01-27 ENCOUNTER — Ambulatory Visit (INDEPENDENT_AMBULATORY_CARE_PROVIDER_SITE_OTHER): Payer: Medicare Other | Admitting: Internal Medicine

## 2011-01-27 DIAGNOSIS — M542 Cervicalgia: Secondary | ICD-10-CM

## 2011-01-27 DIAGNOSIS — M25519 Pain in unspecified shoulder: Secondary | ICD-10-CM

## 2011-01-27 DIAGNOSIS — G5630 Lesion of radial nerve, unspecified upper limb: Secondary | ICD-10-CM

## 2011-01-27 DIAGNOSIS — Z719 Counseling, unspecified: Secondary | ICD-10-CM

## 2011-03-18 ENCOUNTER — Other Ambulatory Visit: Payer: Self-pay | Admitting: Internal Medicine

## 2011-03-19 ENCOUNTER — Other Ambulatory Visit: Payer: Self-pay | Admitting: Cardiology

## 2011-03-19 MED ORDER — VERAPAMIL HCL ER 120 MG PO TBCR: 120.0000 mg | EXTENDED_RELEASE_TABLET | Freq: Every day | ORAL | Status: DC

## 2011-03-25 ENCOUNTER — Other Ambulatory Visit: Payer: Self-pay | Admitting: Physician Assistant

## 2011-03-25 MED ORDER — ALBUTEROL SULFATE HFA 108 (90 BASE) MCG/ACT IN AERS: 2.0000 | INHALATION_SPRAY | RESPIRATORY_TRACT | Status: DC | PRN

## 2011-04-01 ENCOUNTER — Other Ambulatory Visit: Payer: Self-pay | Admitting: Cardiology

## 2011-04-01 MED ORDER — VERAPAMIL HCL ER 120 MG PO TBCR: 120.0000 mg | EXTENDED_RELEASE_TABLET | Freq: Every day | ORAL | Status: DC

## 2011-04-01 NOTE — Telephone Encounter (Signed)
New Msg: pt would like a quantity of 90 tablets with 3 refills of verapamil. Please call this RX into [facility].

## 2011-04-16 ENCOUNTER — Telehealth: Payer: Self-pay

## 2011-04-18 ENCOUNTER — Telehealth: Payer: Self-pay | Admitting: Physician Assistant

## 2011-04-18 MED ORDER — CELECOXIB 200 MG PO CAPS: 200.0000 mg | ORAL_CAPSULE | Freq: Every day | ORAL | Status: AC

## 2011-04-18 NOTE — Telephone Encounter (Signed)
I sent in his celebrex.  Pt is also requesting Hydrocodone - Dr. Perrin Maltese would you sent to [facility] if you approve because of #90 supply needed. - Chart is in you hall box.

## 2011-04-20 ENCOUNTER — Other Ambulatory Visit: Payer: Self-pay

## 2011-04-20 MED ORDER — HYDROCODONE-ACETAMINOPHEN 5-325 MG PO TABS: 1.0000 | ORAL_TABLET | Freq: Three times a day (TID) | ORAL | Status: DC | PRN

## 2011-04-22 NOTE — Telephone Encounter (Signed)
Meds [facility] to [facility].

## 2011-06-13 DIAGNOSIS — R404 Transient alteration of awareness: Secondary | ICD-10-CM | POA: Diagnosis not present

## 2011-06-13 DIAGNOSIS — R55 Syncope and collapse: Secondary | ICD-10-CM | POA: Diagnosis not present

## 2011-06-14 ENCOUNTER — Inpatient Hospital Stay (HOSPITAL_COMMUNITY): Payer: Medicare Other

## 2011-06-14 ENCOUNTER — Observation Stay (HOSPITAL_COMMUNITY)
Admission: EM | Admit: 2011-06-14 | Discharge: 2011-06-14 | Disposition: A | Discharge status: Home / Self Care | Payer: Medicare Other | Attending: Family Medicine | Admitting: Family Medicine

## 2011-06-14 ENCOUNTER — Encounter (HOSPITAL_COMMUNITY): Payer: Self-pay | Admitting: *Deleted

## 2011-06-14 DIAGNOSIS — R0602 Shortness of breath: Secondary | ICD-10-CM | POA: Diagnosis not present

## 2011-06-14 DIAGNOSIS — R109 Unspecified abdominal pain: Secondary | ICD-10-CM | POA: Diagnosis not present

## 2011-06-14 DIAGNOSIS — W19XXXA Unspecified fall, initial encounter: Secondary | ICD-10-CM | POA: Insufficient documentation

## 2011-06-14 DIAGNOSIS — I7771 Dissection of carotid artery: Secondary | ICD-10-CM | POA: Diagnosis not present

## 2011-06-14 DIAGNOSIS — F411 Generalized anxiety disorder: Secondary | ICD-10-CM | POA: Insufficient documentation

## 2011-06-14 DIAGNOSIS — I5022 Chronic systolic (congestive) heart failure: Secondary | ICD-10-CM | POA: Insufficient documentation

## 2011-06-14 DIAGNOSIS — IMO0002 Reserved for concepts with insufficient information to code with codable children: Secondary | ICD-10-CM | POA: Insufficient documentation

## 2011-06-14 DIAGNOSIS — R55 Syncope and collapse: Secondary | ICD-10-CM | POA: Diagnosis not present

## 2011-06-14 DIAGNOSIS — I6509 Occlusion and stenosis of unspecified vertebral artery: Secondary | ICD-10-CM | POA: Diagnosis not present

## 2011-06-14 DIAGNOSIS — I428 Other cardiomyopathies: Secondary | ICD-10-CM | POA: Diagnosis not present

## 2011-06-14 DIAGNOSIS — F419 Anxiety disorder, unspecified: Secondary | ICD-10-CM | POA: Diagnosis present

## 2011-06-14 DIAGNOSIS — I10 Essential (primary) hypertension: Secondary | ICD-10-CM | POA: Diagnosis not present

## 2011-06-14 DIAGNOSIS — T887XXA Unspecified adverse effect of drug or medicament, initial encounter: Secondary | ICD-10-CM

## 2011-06-14 DIAGNOSIS — G8929 Other chronic pain: Secondary | ICD-10-CM | POA: Diagnosis present

## 2011-06-14 DIAGNOSIS — I519 Heart disease, unspecified: Secondary | ICD-10-CM | POA: Diagnosis present

## 2011-06-14 DIAGNOSIS — Y92009 Unspecified place in unspecified non-institutional (private) residence as the place of occurrence of the external cause: Secondary | ICD-10-CM | POA: Insufficient documentation

## 2011-06-14 DIAGNOSIS — I509 Heart failure, unspecified: Secondary | ICD-10-CM | POA: Diagnosis not present

## 2011-06-14 DIAGNOSIS — I771 Stricture of artery: Secondary | ICD-10-CM | POA: Diagnosis not present

## 2011-06-14 DIAGNOSIS — J309 Allergic rhinitis, unspecified: Secondary | ICD-10-CM | POA: Diagnosis not present

## 2011-06-14 DIAGNOSIS — S0181XA Laceration without foreign body of other part of head, initial encounter: Secondary | ICD-10-CM | POA: Diagnosis present

## 2011-06-14 DIAGNOSIS — M7918 Myalgia, other site: Secondary | ICD-10-CM | POA: Diagnosis present

## 2011-06-14 DIAGNOSIS — S01501A Unspecified open wound of lip, initial encounter: Secondary | ICD-10-CM | POA: Diagnosis not present

## 2011-06-14 LAB — CARDIAC PANEL(CRET KIN+CKTOT+MB+TROPI)
CK, MB: 3.8 ng/mL (ref 0.3–4.0)
CK, MB: 3.1 ng/mL (ref 0.3–4.0)
Relative Index: 1.9 (ref 0.0–2.5)
Total CK: 158 U/L (ref 7–232)
Troponin I: <0.3 ng/mL (ref <0.30)

## 2011-06-14 LAB — BASIC METABOLIC PANEL
BUN: 20 mg/dL (ref 6–23)
Calcium: 8.8 mg/dL (ref 8.4–10.5)
Creatinine, Ser: 0.78 mg/dL (ref 0.50–1.35)
GFR calc Af Amer: >90 mL/min (ref >90)
GFR calc non Af Amer: >90 mL/min (ref >90)
Potassium: 4.1 mEq/L (ref 3.5–5.1)

## 2011-06-14 LAB — DIFFERENTIAL
Basophils Absolute: 0 10*3/uL (ref 0.0–0.1)
Eosinophils Absolute: 0 10*3/uL (ref 0.0–0.7)
Lymphocytes Relative: 14 % (ref 12–46)
Lymphs Abs: 1 10*3/uL (ref 0.7–4.0)
Monocytes Absolute: 0.4 10*3/uL (ref 0.1–1.0)
Neutro Abs: 5.6 10*3/uL (ref 1.7–7.7)

## 2011-06-14 LAB — CBC
HCT: 42.2 % (ref 39.0–52.0)
MCHC: 34.8 g/dL (ref 30.0–36.0)
RDW: 12.6 % (ref 11.5–15.5)

## 2011-06-14 LAB — TSH: TSH: 0.997 u[IU]/mL (ref 0.350–4.500)

## 2011-06-14 LAB — LIPID PANEL: HDL: 48 mg/dL (ref >39)

## 2011-06-14 LAB — HEMOGLOBIN A1C
Hgb A1c MFr Bld: 5.7 % — ABNORMAL HIGH (ref <5.7)
Mean Plasma Glucose: 117 mg/dL — ABNORMAL HIGH (ref <117)

## 2011-06-14 LAB — LACTIC ACID, PLASMA: Lactic Acid, Venous: 2.2 mmol/L (ref 0.5–2.2)

## 2011-06-14 MED ORDER — LISINOPRIL 40 MG PO TABS
40.0000 mg | ORAL_TABLET | Freq: Two times a day (BID) | ORAL | Status: DC
  Administered 2011-06-14: 40 mg via ORAL
  Filled 2011-06-14 (×2): qty 1

## 2011-06-14 MED ORDER — LIDOCAINE 5 % EX PTCH: 1.0000 | MEDICATED_PATCH | Freq: Every day | CUTANEOUS | Status: DC | PRN

## 2011-06-14 MED ORDER — FLUTICASONE PROPIONATE 50 MCG/ACT NA SUSP
2.0000 | Freq: Every day | NASAL | Status: DC
  Administered 2011-06-14: 2 via NASAL
  Filled 2011-06-14: qty 16

## 2011-06-14 MED ORDER — ALPRAZOLAM 0.25 MG PO TABS: 0.2500 mg | ORAL_TABLET | Freq: Three times a day (TID) | ORAL | Status: DC | PRN

## 2011-06-14 MED ORDER — VITAMIN D3 25 MCG (1000 UNIT) PO TABS
1000.0000 [IU] | ORAL_TABLET | Freq: Every day | ORAL | Status: DC
  Administered 2011-06-14: 1000 [IU] via ORAL
  Filled 2011-06-14: qty 1

## 2011-06-14 MED ORDER — DIPHENHYDRAMINE HCL 50 MG PO CAPS
50.0000 mg | ORAL_CAPSULE | Freq: Once | ORAL | Status: AC
  Administered 2011-06-14: 50 mg via ORAL
  Filled 2011-06-14: qty 1

## 2011-06-14 MED ORDER — ALBUTEROL SULFATE HFA 108 (90 BASE) MCG/ACT IN AERS: 2.0000 | INHALATION_SPRAY | RESPIRATORY_TRACT | Status: DC | PRN

## 2011-06-14 MED ORDER — SODIUM CHLORIDE 0.9 % IV SOLN: Freq: Once | INTRAVENOUS | Status: DC

## 2011-06-14 MED ORDER — VITAMIN B-12 100 MCG PO TABS
50.0000 ug | ORAL_TABLET | Freq: Every day | ORAL | Status: DC
  Administered 2011-06-14: 50 ug via ORAL
  Filled 2011-06-14: qty 1

## 2011-06-14 MED ORDER — SODIUM CHLORIDE 0.9 % IJ SOLN: 3.0000 mL | Freq: Two times a day (BID) | INTRAMUSCULAR | Status: DC

## 2011-06-14 MED ORDER — GABAPENTIN 300 MG PO CAPS
300.0000 mg | ORAL_CAPSULE | Freq: Three times a day (TID) | ORAL | Status: DC
  Administered 2011-06-14: 300 mg via ORAL
  Filled 2011-06-14 (×3): qty 1

## 2011-06-14 MED ORDER — IOHEXOL 350 MG/ML SOLN
50.0000 mL | Freq: Once | INTRAVENOUS | Status: AC | PRN
  Administered 2011-06-14: 50 mL via INTRAVENOUS

## 2011-06-14 MED ORDER — SODIUM CHLORIDE 0.9 % IV SOLN: 250.0000 mL | INTRAVENOUS | Status: DC | PRN

## 2011-06-14 MED ORDER — VITAMIN C 500 MG PO TABS
1000.0000 mg | ORAL_TABLET | Freq: Every day | ORAL | Status: DC
  Administered 2011-06-14: 1000 mg via ORAL
  Filled 2011-06-14: qty 2

## 2011-06-14 MED ORDER — CELECOXIB 200 MG PO CAPS
200.0000 mg | ORAL_CAPSULE | ORAL | Status: DC
  Administered 2011-06-14: 200 mg via ORAL
  Filled 2011-06-14: qty 1

## 2011-06-14 MED ORDER — HYDROCODONE-ACETAMINOPHEN 5-325 MG PO TABS: 1.0000 | ORAL_TABLET | Freq: Three times a day (TID) | ORAL | Status: DC | PRN

## 2011-06-14 MED ORDER — VERAPAMIL HCL ER 120 MG PO TBCR
120.0000 mg | EXTENDED_RELEASE_TABLET | Freq: Every day | ORAL | Status: DC
  Administered 2011-06-14: 120 mg via ORAL
  Filled 2011-06-14: qty 1

## 2011-06-14 MED ORDER — ASPIRIN EC 81 MG PO TBEC
81.0000 mg | DELAYED_RELEASE_TABLET | Freq: Every day | ORAL | Status: DC
  Administered 2011-06-14: 81 mg via ORAL
  Filled 2011-06-14: qty 1

## 2011-06-14 MED ORDER — SODIUM CHLORIDE 0.9 % IV SOLN
Freq: Once | INTRAVENOUS | Status: AC
  Administered 2011-06-14: 75 mL via INTRAVENOUS

## 2011-06-14 MED ORDER — SODIUM CHLORIDE 0.9 % IJ SOLN: 3.0000 mL | INTRAMUSCULAR | Status: DC | PRN

## 2011-06-14 NOTE — H&P (Signed)
Family Medicine Teaching Service HISTORY & PHYSICAL   Patient name: [name] [name] Medical record number: [id number] Date of birth: [birth year] Age: 71 y.o. Gender: male  Primary Care Provider: Tally Due, MD, MD  CODE STATUS: Full Code   Chief Complaint: Syncope    HPI  [name] [name] is a 71 y.o. year old male presenting following an un-witnessed syncopal episode.  He reports watching TV on the cough and believes he stood up but does remember the events clearly.  Upon awakening he was laying in a pool of blood from nose and lip lacerations but he was able to stand and called out for his wife who was in the next room and who helped him get to bed.  He began experiencing severe abdominal pain immediately following this episode and subsequently called 911.  He has a history significant for Cardiomyopathy, Carotid Artery dissection, and Renal Artery Dissection,  and relates this abdominal pain is similar to when he experienced his renal artery dissection.  His abdominal pain had resolved following arrival to the ED.    Due to contrast media dye - reported as hives to contrast media 20 years ago that pt reports is different than what is currently used - imaging was differed for premedication and [facility] was called for admission for syncopal evaluation.    Pt does report drinking "a couple" of glasses of wine and taking a 1/2 of a percocet due to pain from working in yard earlier today prior to his syncopal event.   ROS   Constitutional No fatigue  Infectious No fevers, no chills  Resp No cough, no congestion, no dyspnea  Cardiac No chest pain; no palpitations  GI No nausea, vomiting, melana  Neuro No focal weakness but generalized weakness following syncopal episode;  MSK Chronic R shoulder pain 2o/2 failing R Total joint replacement  Trauma To R lower lip and nose from fall  Activity Increased yard work today; typically limited by orthopedic pain               HISTORY:  PMHx:   Past Medical History  Diagnosis Date  . HTN (hypertension)   . PVC (premature ventricular contraction)   . History of agent Orange exposure   . Retinopathy     related to PVC's  . Dissection of carotid artery     history  . Dissection of renal artery     history  . GERD (gastroesophageal reflux disease)   . History of hepatitis 1969    [place], unknown what type  . Anal fissure   . Anxiety disorder   . Arthritis   . Asthma   . Adenomatous colon polyp   . Diverticulosis   . Gallstones   . Cardiomyopathy     EF of 40-45%    PSHx: Past Surgical History  Procedure Date  . Appendectomy 1963  . Cervical fusion 1985  . Cholecystectomy 1990  . Tonsillectomy 1965  . Hernia repair   . Carotid disection   . Right renal artery disection   . Nose surgery   . Vasectomy   . Total shoulder replacement     Right    Social Hx: History   Social History  . Marital Status: Married    Spouse Name: N/A    Number of Children: 3  . Years of Education: N/A   Occupational History  . 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  Smoking status: Former Games developer  . Smokeless tobacco: None  . Alcohol Use: Yes     0-3 daily  . Drug Use: No  . Sexually Active: None   Other Topics Concern  . 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 Perrin Maltese    Substance Hx: History  Substance Use Topics  . Smoking status: Former Games developer  . Smokeless tobacco: Not on file  . Alcohol Use: Yes     0-3 daily    Family Hx: Family History  Problem Relation Age of Onset  . Heart disease Father   . Colon cancer      uncle    Allergies: Allergies  Allergen Reactions  . Shrimp (Shellfish Allergy) Anaphylaxis  . Contrast Media (Iodinated Diagnostic Agents)   . Morphine Hives  . Oxycodone-Acetaminophen Nausea And Vomiting    . Penicillins Swelling and Rash    Home Medications: Prior to Admission medications   Medication Sig Start Date End Date Taking? Authorizing Provider  albuterol (PROAIR HFA) 108 (90 BASE) MCG/ACT inhaler Inhale 2 puffs into the lungs every 4 (four) hours as needed for wheezing. 03/25/11  Yes Marzella Schlein McClung, PA-C  ALPRAZolam Prudy Feeler) 0.25 MG tablet Take 0.25 mg by mouth 3 (three) times daily as needed.    Yes Historical Provider, MD  Ascorbic Acid (VITAMIN C) 1000 MG tablet Take 1,000 mg by mouth daily.     Yes Historical Provider, MD  aspirin 325 MG EC tablet Take 162.5 mg by mouth daily.    Yes Historical Provider, MD  celecoxib (CELEBREX) 200 MG capsule Take 200 mg by mouth every other day.    Yes Historical Provider, MD  cholecalciferol (VITAMIN D) 1000 UNITS tablet Take 1,000 Units by mouth daily.     Yes Historical Provider, MD  fluticasone (FLONASE) 50 MCG/ACT nasal spray Place 2 sprays into the nose daily.     Yes Historical Provider, MD  gabapentin (NEURONTIN) 300 MG capsule Take 300 mg by mouth 3 (three) times daily. Takes 1 tablet in the morning and 3 tablets in the evening   Yes Historical Provider, MD  HYDROcodone-acetaminophen (NORCO) 5-325 MG per tablet Take 1 tablet by mouth every 8 (eight) hours as needed. 04/20/11  Yes Jonita Albee, MD  lidocaine (LIDODERM) 5 % Place 1 patch onto the skin daily as needed. As needed for painRemove & Discard patch within 12 hours or as directed by MD   Yes Historical Provider, MD  lisinopril (PRINIVIL,ZESTRIL) 20 MG tablet Take 40 mg by mouth 2 (two) times daily.    Yes Historical Provider, MD  predniSONE (DELTASONE) 20 MG tablet Take 20 mg by mouth daily. For up to 2-3 days for pain   Yes Historical Provider, MD  trolamine salicylate (ASPERCREME) 10 % cream Apply 1 application topically 4 (four) times daily. For pain   Yes Historical Provider, MD  verapamil (CALAN-SR) 120 MG CR tablet Take 1 tablet (120 mg total) by mouth daily. 04/01/11  Yes  Gaylord Shih, MD  vitamin B-12 (CYANOCOBALAMIN) 100 MCG tablet Take 50 mcg by mouth daily.   Yes Historical Provider, MD              OBJECTIVE  Vitals: Patient Vitals for the past 24 hrs:  BP Temp Temp src Pulse Resp SpO2  06/14/11 0225 119/65 mmHg - - 68  20  99 %  06/14/11 0134 120/70 mmHg - - - - -  06/14/11 0131 122/75 mmHg - - - - -  06/14/11 0130 116/67 mmHg - - - - -  06/14/11 0122 114/60 mmHg - - 66  - 97 %  06/14/11 0006 127/68 mmHg 97.7 F (36.5 C) Oral 65  20  96 %   Wt Readings from Last 3 Encounters:  12/11/10 237 lb (107.502 kg)  12/06/09 242 lb (109.77 kg)  06/13/09 243 lb 9.6 oz (110.496 kg)   No intake or output data in the 24 hours ending 06/14/11 1610  PE: GENERAL:  Adult obese caucasian male.  Examined in [facility] ED.  Laying in hospital stretcher  In no discomfort; norespiratory distress.   PSYCH: Alert and appropriately interactive; Insight:Good  Alert, oriented, thought content appropriate, alertness: alert, orientation: time, date, person, place, thought content exhibits logical connections, short term memory appears mildly delayed H&N: Abrasion to R Lower labial margin and R nose over where glasses rest; no active bleeding NECK: supple, no adenopathy, trachea midline and no carotid bruits EYES: EOMi, no scleral icterus ENT+Mouth: normal TM's and external ear canals both earslips, mucosa, and tongue normal; teeth and gums normal THORAX: HEART: RRR, S1/S2 heard, no murmur LUNGS: CTA B, no wheezes, no crackles ABDOMEN:  Obese, +BS, soft , non-tender. No renal, aortic, or iliac artery bruits appreciated; no palpable masses EXTREMITIES: Moves all 4 extremities spontaneously, warm well perfused, no edema, bilateral DP and PT pulses 2/4.   >NEURO: CN II-XII intact; R UE limited by pain but no lateralization of generalized strength  LABS:  Basename 06/14/11 0141  WBC 7.0  HGB 14.7  HCT 42.2  PLT 189    Basename 06/14/11 0141  NA 139  K 4.1  CL 104  CO2  21  BUN 20  CREATININE 0.78  CALCIUM 8.8  GLUCOSE 120*   Hematology:  [facility] 06/14/11 0141  LYMPHSABS 1.0  MONOABS 0.4  EOSABS 0.0  BASOSABS 0.0    Basename 06/14/11 0141  RDW 12.6  MCV 89.2  MCHC 34.8  MRET --    Basename 06/14/11 0141  INR 1.01  APTT 27    Other Labs:  Basename 06/14/11 0152  CKTOTAL 199  TROPONINI <0.30  TROPONINT --  CKMBINDEX --     Basename 06/14/11 0141  DDIMER 0.32    Basename 06/14/11 0143  LATICACIDVEN 2.3*  PROCALCITON --   IMAGING: CT Angio Head - Pending EKG - NSR + 1 PVC but no ST changes; T wave flattening             ASSESSMENT & PLAN  71 y.o. year old male with syncopal episode with known history of vascular dissections.  1. Syncope - Most likely cause  Neurogenic given but in setting of prior vascular issues will image brain and vessels with CTA after pre-medicating. *Benadryl - other Causes consider   cardiac source given prior cardiomyoplasty  Acute intoxication with EtOh and percocet - abdominal pain may be vaso-vagal associated as apparently resolved spontaneously previously - will obtain orthostatic VS; appears euvolemic at this time - d-dimer negative  2. Abdominal Pain - may be vaso-vagal associated; however given prior renal artery dissection consider repeat vascular event -  Will continue to monitor; consider re-infarction of kidney vs mesenteric ischemia vs AAA - Clinically improved so will delay further work up as CTA-abdomen will not be able to be obtained concomitantly with CTA head; trend renal function;  -Boarderline lactate - continue to trend; check lipids  3. CV (HTN, Chronic Systolic CHF) - given syncopal event will cycle CEs and repeat EKG in AM; place on telemetry *ASA *Lisinopril *Verapamil [ ]  CEs pending [ ]  EKG pending - monitor fluid status but will hydrate for contrast dye  4. RENAL (Prior renal infarction);  No signs of impaired kidney function at this time  - pre hydrate for  CTA  5.  MSK (Chronic Pain) - Pt with multiple orthopedic issues; will continue home medications at this time *Celebrex *lidoderm patch *Norco  6. PSYCH (Anxiety, EtOH dependence) - Will continue home Xanax and place on CIWA protocol;  --- FEN  *NS 69mLs/hr given contrast load to be administered -Heart Healthy --- PPx: SCDs given ?vasculopathic event             DISPOSITION  Will place in observation and premedicate for CTA head; will continue to monitor labs and cycle CEs while on Telemetry.    Andrena Mews, DO [facility] Resident - PGY-1 06/14/2011 3:37 AM  Addendum R3. 7:33 AM 06/14/2011  Patient seen and examined independently. Agree with above note. Independent PE:  General: obese C/M, nad, aox3 Face: Abrasion to R Lower labial margin and R nose over where glasses rest; no active bleeding Lungs:  Normal respiratory effort, chest expands symmetrically. Lungs are clear to auscultation, no crackles or wheezes. Heart - Regular rate and rhythm.  No murmurs, gallops or rubs.    Extremities:   Non-tender, No cyanosis, edema, or deformity noted.  A/P: See above A/P  Fredda Clarida,JONATHANMD 7:36 AM 06/14/2011

## 2011-06-14 NOTE — H&P (Signed)
[facility] Attending Admission Note: [name] Toves MD [phone] pager office [phone] I  have seen and examined this patient, reviewed their chart. I have discussed this patient with the resident. I agree with the resident's findings, assessment and care plan. 

## 2011-06-14 NOTE — ED Provider Notes (Signed)
Medical screening examination/treatment/procedure(s) were conducted as a shared visit with non-physician practitioner(s) and myself.  I personally evaluated the patient during the encounter   Awake and alert.  Does not recall falling but awoke with SOB and severe abdominal pain.  No CP, no n/v/d.    AO3 Abrasion to the nose, avulsion from lower lip RRR CTAB NABS, soft no guarding no rebound FROM  Will need admission for syncope work up   Date: 06/14/2011  Rate 69  Rhythm: normal sinus rhythm  QRS Axis: normal  Intervals: PR prolonged  ST/T Wave abnormalities: nonspecific ST changes  Conduction Disutrbances:first-degree A-V block   Narrative Interpretation:   Old EKG Reviewed: none available    [name] [name] K Mckenna Gamm-Rasch, MD 06/14/11 0206

## 2011-06-14 NOTE — ED Notes (Signed)
The pt fell tonight lac lip lac nose.  Steady gait. Pt from home

## 2011-06-14 NOTE — ED Notes (Signed)
The pt says he feels ok at present. Waiting for the doctor to see.  No loose teeth no active bleeding.  nsr on the monitor

## 2011-06-14 NOTE — ED Notes (Signed)
Admitting doctor in room.  Report has been  Called to the floor

## 2011-06-14 NOTE — Discharge Summary (Signed)
Physician Discharge Summary  Patient ID: [name] [name] MRN: [id number] DOB/AGE: 09/11/1940 71 y.o.  Admit date: 06/14/2011 Discharge date: 06/14/2011  Admission Diagnoses: Syncope with minor facial trauma   Discharge Diagnoses:  Principal Problem:  *Syncope Active Problems:  HYPERTENSION  Facial laceration  Anxiety disorder  Allergic rhinitis  Chronic musculoskeletal pain  Left ventricular systolic dysfunction   Discharged Condition: stable  Hospital Course:   [name] [name] presented after an acute, unwitnessed syncopal episode at his home on Friday night. It was not preceded by any symptoms. However, the patient consumed several glasses of wine and half of an opioid pain medication that evening. They fall caused a facial laceration, which did not require stitches. A work-up for the syncope took place, keeping in mind that the patient has a history of internal carotid and renal artery dissections as well as cardiomyopathy. A CT-Angiogram of the head revealed a 7.3 mm cavernoma in the left operculum and stable left ICA dissection that was unchanged since 2011. On telemetry the patient had normal sinus rhythm, and his EKG was not diagnostic of an arrythmia. Moreover, the patient's cardiac enzymes were normal. Lastly, orthostatic vitals signs were negative for hypotension. The original work-up included carotid doppler studies to evaluate stenosis. However, the patient requested to leave the hospital to travel for a family function taking place the next morning. Given that he was completely asymptomatic without any concerning study results, it was appropriate to have carotid dopplers performed as an outpatient at the discretion of his PCP or cardiologist.    Otherwise, [name] [name] was treated with all of his home medications for anxiety, chronic pain, allergic rhinitis, and hypertension. All of these medications were continued upon discharge. Additionally, the patient was instructed to return the  hospital if her had any syncope or pre-syncopal episodes. Moreover, he was instructed to make an appointment with his cardiologist, because he did not want to wait to be evaluated by a cardiologist in the hospital.   Consults: None  Significant Diagnostic Studies:   CT-A Head: Posterior left opercular 7.3 mm hyperdensity. There is a vessel immediately adjacent to this region and this may represent a combination of a small developmental venous anomaly in addition to a small cavernoma. Acute intracranial hemorrhage is a secondary less likely consideration. If the patient had any progressive symptoms this can be reevaluated on close follow-up. Otherwise follow-up exam in 6 months recommended to confirm stability and exclude other considerations as cause for this finding.  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  Calcification with mild narrowing and irregularity of the cavernous segment of the internal carotid artery bilaterally.  Calcification with mild to moderate narrowing involving the distal vertebral arteries bilaterally.  Small basilar artery with mild irregularity and narrowing.  Acute Abdominal Series X-ray: 1. No acute cardiopulmonary findings.  2. No plain film findings for an acute abdominal process.   Lab Results  Component Value Date   TSH 0.997 06/14/2011   Lab Results  Component Value Date   HGBA1C 5.7* 06/14/2011   Lipid Panel     Component Value Date/Time   CHOL 135 06/14/2011 0430   TRIG 80 06/14/2011 0430   HDL 48 06/14/2011 0430   CHOLHDL 2.8 06/14/2011 0430   VLDL 16 06/14/2011 0430   LDLCALC 71 06/14/2011 0430    Cardiac Enzymes:  Lab 06/14/11 0945 06/14/11 0152  CKTOTAL 158 199  CKMB 3.1 3.8  CKMBINDEX -- --  TROPONINI <0.30 <0.30     Discharge Exam: Blood pressure 126/79, pulse 77, temperature 97.8 F (36.6 C), temperature source Oral, resp. rate 18,  height 5\' 10"  (1.778 m), weight 236 lb 8 oz (107.276 kg), SpO2 95.00%. GENERAL: Adult obese caucasian male. Examined in [facility] ED. Laying in hospital stretcher In no discomfort; norespiratory distress.  PSYCH: Alert and appropriately interactive; Insight:Good Alert, oriented, thought content appropriate, alertness: alert, orientation: time, date, person, place, thought content exhibits logical connections, short term memory appears mildly delayed  H&N: Abrasion to R Lower labial margin and R nose over where glasses rest; no active bleeding  NECK: supple, no adenopathy, trachea midline and no carotid bruits  EYES: EOMi, no scleral icterus  ENT+Mouth: normal TM's and external ear canals both earslips, mucosa, and tongue normal; teeth and gums normal  THORAX:  HEART: RRR, S1/S2 heard, no murmur  LUNGS: CTA B, no wheezes, no crackles  ABDOMEN: Obese, +BS, soft , non-tender. No renal, aortic, or iliac artery bruits appreciated; no palpable masses  EXTREMITIES: Moves all 4 extremities spontaneously, warm well perfused, no edema, bilateral DP and PT pulses 2/4.  >NEURO: CN II-XII intact; R UE limited by pain but no lateralization of generalized strength   Disposition: Discharge to home   Discharge Orders    Future Orders Please Complete By Expires   Diet - low sodium heart healthy      Increase activity slowly        Medication List  As of 06/14/2011  3:42 PM   TAKE these medications         albuterol 108 (90 BASE) MCG/ACT inhaler   Commonly known as: PROVENTIL HFA;VENTOLIN HFA   Inhale 2 puffs into the lungs every 4 (four) hours as needed for wheezing.      ALPRAZolam 0.25 MG tablet   Commonly known as: XANAX   Take 0.25 mg by mouth 3 (three) times daily as needed.      aspirin 325 MG EC tablet   Take 162.5 mg by mouth daily.      celecoxib 200 MG capsule   Commonly known as: CELEBREX   Take 200 mg by mouth every other day.      cholecalciferol 1000 UNITS tablet   Commonly known as:  VITAMIN D   Take 1,000 Units by mouth daily.      fluticasone 50 MCG/ACT nasal spray   Commonly known as: FLONASE   Place 2 sprays into the nose daily.      gabapentin 300 MG capsule   Commonly known as: NEURONTIN   Take 300 mg by mouth 3 (three) times daily. Takes 1 tablet in the morning and 3 tablets in the evening      HYDROcodone-acetaminophen 5-325 MG per tablet   Commonly known as: NORCO   Take 1 tablet by mouth every 8 (eight) hours as needed.      lidocaine 5 %   Commonly known as: LIDODERM   Place 1 patch onto the skin daily as needed. As needed for painRemove & Discard patch within 12 hours or as directed by MD      lisinopril 20 MG tablet   Commonly known as: PRINIVIL,ZESTRIL   Take 40 mg by mouth 2 (two) times daily.      predniSONE 20 MG tablet   Commonly known as: DELTASONE   Take 20 mg by mouth daily. For up to 2-3 days for pain      trolamine salicylate 10 %  cream   Commonly known as: ASPERCREME   Apply 1 application topically 4 (four) times daily. For pain      verapamil 120 MG CR tablet   Commonly known as: CALAN-SR   Take 1 tablet (120 mg total) by mouth daily.      vitamin B-12 100 MCG tablet   Commonly known as: CYANOCOBALAMIN   Take 50 mcg by mouth daily.      vitamin C 1000 MG tablet   Take 1,000 mg by mouth daily.           Follow-up Information    Schedule an appointment as soon as possible for a visit with Valera Castle, MD.   Contact information:   [address] [place] [address] [phone]          Signed: Mat Carne 06/14/2011, 3:42 PM

## 2011-06-14 NOTE — Progress Notes (Deleted)
Family Medicine Teaching Service HISTORY & PHYSICAL   Patient name: [name] [name] Medical record number: [id number] Date of birth: 1940-10-02 Age: 71 y.o. Gender: male  Primary Care Provider: Tally Due, MD, MD  CODE STATUS: Full Code   Chief Complaint: Syncope    HPI  [name] [name] is a 71 y.o. year old male presenting following an un-witnessed syncopal episode.  He reports watching TV on the cough and believes he stood up but does remember the events clearly.  Upon awakening he was laying in a pool of blood from nose and lip lacerations but he was able to stand and called out for his wife who was in the next room and who helped him get to bed.  He began experiencing severe abdominal pain immediately following this episode and subsequently called 911.  He has a history significant for Cardiomyopathy, Carotid Artery dissection, and Renal Artery Dissection,  and relates this abdominal pain is similar to when he experienced his renal artery dissection.  His abdominal pain had resolved following arrival to the ED.    Due to contrast media dye - reported as hives to contrast media 20 years ago that pt reports is different than what is currently used - imaging was differed for premedication and [facility] was called for admission for syncopal evaluation.    Pt does report drinking "a couple" of glasses of wine and taking a 1/2 of a percocet due to pain from working in yard earlier today prior to his syncopal event.   ROS   Constitutional No fatigue  Infectious No fevers, no chills  Resp No cough, no congestion, no dyspnea  Cardiac No chest pain; no palpitations  GI No nausea, vomiting, melana  Neuro No focal weakness but generalized weakness following syncopal episode;  MSK Chronic R shoulder pain 2o/2 failing R Total joint replacement  Trauma To R lower lip and nose from fall  Activity Increased yard work today; typically limited by orthopedic pain               HISTORY:  PMHx:   Past Medical History  Diagnosis Date  . HTN (hypertension)   . PVC (premature ventricular contraction)   . History of agent Orange exposure   . Retinopathy     related to PVC's  . Dissection of carotid artery     history  . Dissection of renal artery     history  . GERD (gastroesophageal reflux disease)   . History of hepatitis 1969    [place], unknown what type  . Anal fissure   . Anxiety disorder   . Arthritis   . Asthma   . Adenomatous colon polyp   . Diverticulosis   . Gallstones   . Cardiomyopathy     EF of 40-45%    PSHx: Past Surgical History  Procedure Date  . Appendectomy 1963  . Cervical fusion 1985  . Cholecystectomy 1990  . Tonsillectomy 1965  . Hernia repair   . Carotid disection   . Right renal artery disection   . Nose surgery   . Vasectomy   . Total shoulder replacement     Right    Social Hx: History   Social History  . Marital Status: Married    Spouse Name: N/A    Number of Children: 3  . Years of Education: N/A   Occupational History  . 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  Smoking status: Former Games developer  . Smokeless tobacco: None  . Alcohol Use: Yes     0-3 daily  . Drug Use: No  . Sexually Active: None   Other Topics Concern  . 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 Perrin Maltese    Substance Hx: History  Substance Use Topics  . Smoking status: Former Games developer  . Smokeless tobacco: Not on file  . Alcohol Use: Yes     0-3 daily    Family Hx: Family History  Problem Relation Age of Onset  . Heart disease Father   . Colon cancer      uncle    Allergies: Allergies  Allergen Reactions  . Shrimp (Shellfish Allergy) Anaphylaxis  . Contrast Media (Iodinated Diagnostic Agents)   . Morphine Hives  . Oxycodone-Acetaminophen Nausea And Vomiting    . Penicillins Swelling and Rash    Home Medications: Prior to Admission medications   Medication Sig Start Date End Date Taking? Authorizing Provider  albuterol (PROAIR HFA) 108 (90 BASE) MCG/ACT inhaler Inhale 2 puffs into the lungs every 4 (four) hours as needed for wheezing. 03/25/11  Yes Marzella Schlein McClung, PA-C  ALPRAZolam Prudy Feeler) 0.25 MG tablet Take 0.25 mg by mouth 3 (three) times daily as needed.    Yes Historical Provider, MD  Ascorbic Acid (VITAMIN C) 1000 MG tablet Take 1,000 mg by mouth daily.     Yes Historical Provider, MD  aspirin 325 MG EC tablet Take 162.5 mg by mouth daily.    Yes Historical Provider, MD  celecoxib (CELEBREX) 200 MG capsule Take 200 mg by mouth every other day.    Yes Historical Provider, MD  cholecalciferol (VITAMIN D) 1000 UNITS tablet Take 1,000 Units by mouth daily.     Yes Historical Provider, MD  fluticasone (FLONASE) 50 MCG/ACT nasal spray Place 2 sprays into the nose daily.     Yes Historical Provider, MD  gabapentin (NEURONTIN) 300 MG capsule Take 300 mg by mouth 3 (three) times daily. Takes 1 tablet in the morning and 3 tablets in the evening   Yes Historical Provider, MD  HYDROcodone-acetaminophen (NORCO) 5-325 MG per tablet Take 1 tablet by mouth every 8 (eight) hours as needed. 04/20/11  Yes Jonita Albee, MD  lidocaine (LIDODERM) 5 % Place 1 patch onto the skin daily as needed. As needed for painRemove & Discard patch within 12 hours or as directed by MD   Yes Historical Provider, MD  lisinopril (PRINIVIL,ZESTRIL) 20 MG tablet Take 40 mg by mouth 2 (two) times daily.    Yes Historical Provider, MD  predniSONE (DELTASONE) 20 MG tablet Take 20 mg by mouth daily. For up to 2-3 days for pain   Yes Historical Provider, MD  trolamine salicylate (ASPERCREME) 10 % cream Apply 1 application topically 4 (four) times daily. For pain   Yes Historical Provider, MD  verapamil (CALAN-SR) 120 MG CR tablet Take 1 tablet (120 mg total) by mouth daily. 04/01/11  Yes  Gaylord Shih, MD  vitamin B-12 (CYANOCOBALAMIN) 100 MCG tablet Take 50 mcg by mouth daily.   Yes Historical Provider, MD              OBJECTIVE  Vitals: Patient Vitals for the past 24 hrs:  BP Temp Temp src Pulse Resp SpO2  06/14/11 0225 119/65 mmHg - - 68  20  99 %  06/14/11 0134 120/70 mmHg - - - - -  06/14/11 0131 122/75 mmHg - - - - -  06/14/11 0130 116/67 mmHg - - - - -  06/14/11 0122 114/60 mmHg - - 66  - 97 %  06/14/11 0006 127/68 mmHg 97.7 F (36.5 C) Oral 65  20  96 %   Wt Readings from Last 3 Encounters:  12/11/10 237 lb (107.502 kg)  12/06/09 242 lb (109.77 kg)  06/13/09 243 lb 9.6 oz (110.496 kg)   No intake or output data in the 24 hours ending 06/14/11 1610  PE: GENERAL:  Adult obese caucasian male.  Examined in [facility] ED.  Laying in hospital stretcher  In no discomfort; norespiratory distress.   PSYCH: Alert and appropriately interactive; Insight:Good  Alert, oriented, thought content appropriate, alertness: alert, orientation: time, date, person, place, thought content exhibits logical connections, short term memory appears mildly delayed H&N: Abrasion to R Lower labial margin and R nose over where glasses rest; no active bleeding NECK: supple, no adenopathy, trachea midline and no carotid bruits EYES: EOMi, no scleral icterus ENT+Mouth: normal TM's and external ear canals both earslips, mucosa, and tongue normal; teeth and gums normal THORAX: HEART: RRR, S1/S2 heard, no murmur LUNGS: CTA B, no wheezes, no crackles ABDOMEN:  Obese, +BS, soft , non-tender. No renal, aortic, or iliac artery bruits appreciated; no palpable masses EXTREMITIES: Moves all 4 extremities spontaneously, warm well perfused, no edema, bilateral DP and PT pulses 2/4.   >NEURO: CN II-XII intact; R UE limited by pain but no lateralization of generalized strength  LABS:  Basename 06/14/11 0141  WBC 7.0  HGB 14.7  HCT 42.2  PLT 189    Basename 06/14/11 0141  NA 139  K 4.1  CL 104  CO2  21  BUN 20  CREATININE 0.78  CALCIUM 8.8  GLUCOSE 120*   Hematology:  [facility] 06/14/11 0141  LYMPHSABS 1.0  MONOABS 0.4  EOSABS 0.0  BASOSABS 0.0    Basename 06/14/11 0141  RDW 12.6  MCV 89.2  MCHC 34.8  MRET --    Basename 06/14/11 0141  INR 1.01  APTT 27    Other Labs:  Basename 06/14/11 0152  CKTOTAL 199  TROPONINI <0.30  TROPONINT --  CKMBINDEX --     Basename 06/14/11 0141  DDIMER 0.32    Basename 06/14/11 0143  LATICACIDVEN 2.3*  PROCALCITON --   IMAGING: CT Angio Head - Pending EKG - NSR + 1 PVC but no ST changes; T wave flattening             ASSESSMENT & PLAN  71 y.o. year old male with syncopal episode with known history of vascular dissections.  1. Syncope - Most likely cause  Neurogenic given but in setting of prior vascular issues will image brain and vessels with CTA after pre-medicating. *Benadryl - other Causes consider   cardiac source given prior cardiomyoplasty  Acute intoxication with EtOh and percocet - abdominal pain may be vaso-vagal associated as apparently resolved spontaneously previously - will obtain orthostatic VS; appears euvolemic at this time - d-dimer negative  2. Abdominal Pain - may be vaso-vagal associated; however given prior renal artery dissection consider repeat vascular event -  Will continue to monitor; consider re-infarction of kidney vs mesenteric ischemia vs AAA - Clinically improved so will delay further work up as CTA-abdomen will not be able to be obtained concomitantly with CTA head; trend renal function;  -Boarderline lactate - continue to trend; check lipids  3. CV (HTN, Chronic Systolic CHF) - given syncopal event will cycle CEs and repeat EKG in AM; place on telemetry *ASA *Lisinopril *Verapamil [ ]  CEs pending [ ]  EKG pending - monitor fluid status but will hydrate for contrast dye  4. RENAL (Prior renal infarction);  No signs of impaired kidney function at this time  - pre hydrate for  CTA  5.  MSK (Chronic Pain) - Pt with multiple orthopedic issues; will continue home medications at this time *Celebrex *lidoderm patch *Norco  6. PSYCH (Anxiety, EtOH dependence) - Will continue home Xanax and place on CIWA protocol;  --- FEN  *NS 20mLs/hr given contrast load to be administered -Heart Healthy --- PPx: SCDs given ?vasculopathic event             DISPOSITION  Will place in observation and premedicate for CTA head; will continue to monitor labs and cycle CEs while on Telemetry.    Andrena Mews, DO [facility] Resident - PGY-1 06/14/2011 3:37 AM

## 2011-06-14 NOTE — Discharge Instructions (Signed)
Dear Mr. [name] [name],   We are pleased that you are feeling better. Please seek medical attention if you having lightheadedness, heart palpitations, or feel like you are going to faint. I will forward your discharge summary to Dr. Daleen Squibb.   Enjoy the upcoming special events.   Sincerely,   Dr. Clinton Sawyer

## 2011-06-14 NOTE — ED Provider Notes (Signed)
History     CSN: [id number]  Arrival date & time 06/14/11  0001   First MD Initiated Contact with Patient 06/14/11 0009      Chief Complaint  Patient presents with  . Fall    (Consider location/radiation/quality/duration/timing/severity/associated sxs/prior treatment) Patient is a 71 y.o. male presenting with fall. The history is provided by the patient.  Fall The accident occurred 3 to 5 hours ago. The point of impact was the head. He was ambulatory at the scene. Associated symptoms include abdominal pain. Pertinent negatives include no fever, no nausea, no vomiting and no headaches. Associated symptoms comments: He went to stand from a chair and fell forward, hitting his face on the floor. He feels he passed out prior to fall, does not remember impact and woke up on the floor. He rested afterward in bed and started having abdominal pain he reports as significant, associated with shortness of breath. EMS was called. Patient states that his symptoms improved with oxygen. .    Past Medical History  Diagnosis Date  . HTN (hypertension)   . PVC (premature ventricular contraction)   . History of agent Orange exposure   . Retinopathy     related to PVC's  . Dissection of carotid artery     history  . Dissection of renal artery     history  . GERD (gastroesophageal reflux disease)   . History of hepatitis 1969    [place], unknown what type  . Anal fissure   . Anxiety disorder   . Arthritis   . Asthma   . Adenomatous colon polyp   . Diverticulosis   . Gallstones   . Cardiomyopathy     Past Surgical History  Procedure Date  . Appendectomy 1963  . Cervical fusion 1985  . Cholecystectomy 1990  . Tonsillectomy 1965  . Hernia repair   . Carotid disection   . Right renal artery disection     Family History  Problem Relation Age of Onset  . Heart disease Father   . Colon cancer      uncle    History  Substance Use Topics  . Smoking status: Former Games developer  . Smokeless  tobacco: Not on file  . Alcohol Use: Yes     4-5 daily      Review of Systems  Constitutional: Negative for fever.  HENT: Positive for facial swelling. Negative for dental problem.   Respiratory: Positive for shortness of breath.   Gastrointestinal: Positive for abdominal pain. Negative for nausea and vomiting.  Skin: Positive for wound.  Neurological: Positive for syncope and weakness. Negative for headaches.    Allergies  Shrimp; Contrast media; Morphine; Oxycodone-acetaminophen; and Penicillins  Home Medications   Current Outpatient Rx  Name Route Sig Dispense Refill  . ALBUTEROL SULFATE HFA 108 (90 BASE) MCG/ACT IN AERS Inhalation Inhale 2 puffs into the lungs every 4 (four) hours as needed for wheezing. 17 g 3  . ALPRAZOLAM 0.25 MG PO TABS Oral Take 0.25 mg by mouth 3 (three) times daily as needed.     Marland Kitchen VITAMIN C 1000 MG PO TABS Oral Take 1,000 mg by mouth daily.      . ASPIRIN 325 MG PO TBEC Oral Take 162.5 mg by mouth daily.     . CELECOXIB 200 MG PO CAPS Oral Take 200 mg by mouth daily. Takes every 2-3 days for pain    . VITAMIN D 1000 UNITS PO TABS Oral Take 1,000 Units by mouth daily.      Marland Kitchen  FLUTICASONE PROPIONATE 50 MCG/ACT NA SUSP Nasal Place 2 sprays into the nose daily.      Marland Kitchen GABAPENTIN 300 MG PO CAPS Oral Take 300 mg by mouth 3 (three) times daily. Takes 1 tablet in the morning and 3 tablets in the evening    . HYDROCODONE-ACETAMINOPHEN 5-325 MG PO TABS Oral Take 1 tablet by mouth every 8 (eight) hours as needed. 90 tablet 3  . LIDOCAINE 5 % EX PTCH Transdermal Place 1 patch onto the skin daily as needed. As needed for painRemove & Discard patch within 12 hours or as directed by MD    . LISINOPRIL 20 MG PO TABS Oral Take 40 mg by mouth 2 (two) times daily.     Marland Kitchen PREDNISONE 20 MG PO TABS Oral Take 20 mg by mouth daily. For up to 2-3 days for pain    . TROLAMINE SALICYLATE 10 % EX CREA Topical Apply 1 application topically 4 (four) times daily. For pain    .  VERAPAMIL HCL ER 120 MG PO TBCR Oral Take 1 tablet (120 mg total) by mouth daily. 90 tablet 3  . VITAMIN B-12 100 MCG PO TABS Oral Take 50 mcg by mouth daily.      BP 127/68  Pulse 65  Temp(Src) 97.7 F (36.5 C) (Oral)  Resp 20  SpO2 96%  Physical Exam  Constitutional: He is oriented to person, place, and time. He appears well-developed and well-nourished. No distress.  HENT:  Head: Normocephalic.       No dental tenderness or instability.   Eyes: Conjunctivae are normal. Pupils are equal, round, and reactive to light.  Neck: Normal range of motion.  Cardiovascular: Normal rate.   No murmur heard. Pulmonary/Chest: Effort normal and breath sounds normal. He has no wheezes. He has no rales.  Abdominal: Soft. Bowel sounds are normal. He exhibits no distension. There is no tenderness. There is no rebound.  Musculoskeletal:       No neck pain or other spinal pain. Full, pain free range of motion of all extremities without swelling or deformity.  Neurological: He is alert and oriented to person, place, and time.  Skin:       Abrasion to bridge of nose and to lower lip.     ED Course  Procedures (including critical care time)  Labs Reviewed - No data to display No results found.   No diagnosis found. 1. Syncope 2.    MDM  The patient has a CM allergy and is a known vasculopath with history of multiple dissections. R/O dissection after syncope, abdominal pain and SOB is not possible in the acute setting given required 12-hour prep for CT vs. MRI with safe CM available in the morning. Do not feel with stable vital signs and resolved abdominal pain/SOB that emergent MRI warranted. Patient admitted for further evaluation.        Rodena Medin, PA-C 06/14/11 [phone]

## 2011-06-15 ENCOUNTER — Encounter: Payer: Self-pay | Admitting: Family Medicine

## 2011-06-15 NOTE — Discharge Summary (Signed)
Family Medicine Teaching Service  Discharge Note : Attending Denny Levy MD Pager [phone] Office [phone] I have seen and examined this patient, reviewed their chart and discussed discharge planning wit the resident at the time of discharge. I agree with the discharge plan as above.  His episode is most c/w orthostatic hypotension secondary to a mix of alcohol and narcotics. His hx of multiple vascualr abnormalitis and his dye intolerance is noted. Will let his PCP determine need for further work up. This episode does not seem CV in origin and I think he is safe for d/c with appropriate follow up.

## 2011-06-18 NOTE — Progress Notes (Signed)
06/18/2011 Tanina Barb SPARKS Case Management Note [phone]  Utilization review completed.  

## 2011-06-27 DIAGNOSIS — H35369 Drusen (degenerative) of macula, unspecified eye: Secondary | ICD-10-CM | POA: Diagnosis not present

## 2011-06-27 DIAGNOSIS — H3530 Unspecified macular degeneration: Secondary | ICD-10-CM | POA: Diagnosis not present

## 2011-06-27 DIAGNOSIS — H2510 Age-related nuclear cataract, unspecified eye: Secondary | ICD-10-CM | POA: Diagnosis not present

## 2011-06-30 ENCOUNTER — Telehealth: Payer: Self-pay

## 2011-06-30 ENCOUNTER — Other Ambulatory Visit: Payer: Self-pay

## 2011-06-30 MED ORDER — LIDOCAINE 5 % EX PTCH: 1.0000 | MEDICATED_PATCH | Freq: Every day | CUTANEOUS | Status: DC | PRN

## 2011-06-30 NOTE — Telephone Encounter (Signed)
PT AND RELATIVE OF DR. Perrin Maltese.  WANTING MED REFILLS TAKEN CARE OF THROUGH [facility]. SPECIFICALLY... ALPRAZOLAM 90 DAYS +1 90 DAY REFILL LISINOPRIL GABAPENTIN PRO-AIRE FLUTICASONE

## 2011-07-02 ENCOUNTER — Other Ambulatory Visit: Payer: Self-pay | Admitting: Family Medicine

## 2011-07-02 ENCOUNTER — Other Ambulatory Visit: Payer: Self-pay | Admitting: Internal Medicine

## 2011-07-02 ENCOUNTER — Other Ambulatory Visit: Payer: Self-pay | Admitting: Physician Assistant

## 2011-07-02 MED ORDER — ALBUTEROL SULFATE HFA 108 (90 BASE) MCG/ACT IN AERS: 2.0000 | INHALATION_SPRAY | RESPIRATORY_TRACT | Status: DC | PRN

## 2011-07-02 NOTE — Telephone Encounter (Signed)
All refilled today.

## 2011-07-03 NOTE — Telephone Encounter (Signed)
LMOM that we received his req and have taken care of it.

## 2011-08-05 ENCOUNTER — Other Ambulatory Visit: Payer: Self-pay | Admitting: Physician Assistant

## 2011-08-05 MED ORDER — LISINOPRIL 20 MG PO TABS: 20.0000 mg | ORAL_TABLET | Freq: Two times a day (BID) | ORAL | Status: DC

## 2011-08-29 ENCOUNTER — Ambulatory Visit: Payer: Medicare Other

## 2011-08-29 ENCOUNTER — Ambulatory Visit (INDEPENDENT_AMBULATORY_CARE_PROVIDER_SITE_OTHER): Payer: Medicare Other | Admitting: Internal Medicine

## 2011-08-29 VITALS — BP 140/90 | HR 67 | Temp 98.6°F | Resp 16 | Ht 69.0 in | Wt 239.0 lb

## 2011-08-29 DIAGNOSIS — Z125 Encounter for screening for malignant neoplasm of prostate: Secondary | ICD-10-CM | POA: Diagnosis not present

## 2011-08-29 DIAGNOSIS — I428 Other cardiomyopathies: Secondary | ICD-10-CM | POA: Diagnosis not present

## 2011-08-29 DIAGNOSIS — G8929 Other chronic pain: Secondary | ICD-10-CM | POA: Diagnosis not present

## 2011-08-29 DIAGNOSIS — I429 Cardiomyopathy, unspecified: Secondary | ICD-10-CM

## 2011-08-29 DIAGNOSIS — Z139 Encounter for screening, unspecified: Secondary | ICD-10-CM

## 2011-08-29 DIAGNOSIS — I10 Essential (primary) hypertension: Secondary | ICD-10-CM

## 2011-08-29 DIAGNOSIS — Z0000 Encounter for general adult medical examination without abnormal findings: Secondary | ICD-10-CM

## 2011-08-29 DIAGNOSIS — R5381 Other malaise: Secondary | ICD-10-CM | POA: Diagnosis not present

## 2011-08-29 DIAGNOSIS — R5383 Other fatigue: Secondary | ICD-10-CM

## 2011-08-29 DIAGNOSIS — Z5181 Encounter for therapeutic drug level monitoring: Secondary | ICD-10-CM | POA: Diagnosis not present

## 2011-08-29 DIAGNOSIS — D510 Vitamin B12 deficiency anemia due to intrinsic factor deficiency: Secondary | ICD-10-CM | POA: Diagnosis not present

## 2011-08-29 LAB — POCT CBC
Granulocyte percent: 67.5 %G (ref 37–80)
Hemoglobin: 15.2 g/dL (ref 14.1–18.1)
MCV: 93.7 fL (ref 80–97)
MID (cbc): 0.5 (ref 0–0.9)
MPV: 11.2 fL (ref 0–99.8)
POC MID %: 8.8 %M (ref 0–12)
Platelet Count, POC: 216 10*3/uL (ref 142–424)
RBC: 5.1 M/uL (ref 4.69–6.13)

## 2011-08-29 LAB — POCT URINALYSIS DIPSTICK
Glucose, UA: NEGATIVE
Nitrite, UA: NEGATIVE
Urobilinogen, UA: 0.2

## 2011-08-29 LAB — POCT UA - MICROSCOPIC ONLY
Bacteria, U Microscopic: NEGATIVE
Casts, Ur, LPF, POC: NEGATIVE
Epithelial cells, urine per micros: NEGATIVE
Mucus, UA: NEGATIVE
Yeast, UA: NEGATIVE

## 2011-08-29 NOTE — Progress Notes (Signed)
  Subjective:    Patient ID: [name] [name], male    DOB: 13-May-1940, 71 y.o.   MRN: [id number]  HPI See complex problem list. All issues are stable and controlled by all specialists reports and his report. Meds are complex and each reviewed. No dizzy spells, syncope or falls since er visit 4-5 mos. Ago. Caused by a combo of pain meds and wine and jumping up to quick. See scanned hx form   Review of Systems See scanned ros form    Objective:   Physical Exam  Constitutional: He is oriented to person, place, and time. He appears well-developed and well-nourished. No distress.  HENT:  Right Ear: External ear normal.  Left Ear: External ear normal.  Nose: Nose normal.  Mouth/Throat: Oropharynx is clear and moist. No oropharyngeal exudate.  Eyes: EOM are normal. Pupils are equal, round, and reactive to light. No scleral icterus.  Neck: Neck supple. No JVD present. No tracheal deviation present. No thyromegaly present.  Cardiovascular: Normal rate, regular rhythm, normal heart sounds and intact distal pulses.   No murmur heard. Pulmonary/Chest: Effort normal and breath sounds normal. No respiratory distress. He has no wheezes. He has no rales. He exhibits no tenderness.  Abdominal: Soft. Bowel sounds are normal. He exhibits no mass. There is no tenderness.  Genitourinary: Rectum normal, prostate normal and penis normal.  Musculoskeletal:       Right shoulder: He exhibits decreased range of motion, tenderness, bony tenderness, deformity, pain and decreased strength.       Right knee: tenderness found.       Left knee: tenderness found.       Right foot: He exhibits tenderness, bony tenderness and deformity.       Left foot: He exhibits tenderness, bony tenderness and deformity.       Hammer toes  Lymphadenopathy:    He has no cervical adenopathy.  Neurological: He is alert and oriented to person, place, and time. He has normal reflexes. He displays atrophy. No cranial nerve deficit.  Coordination normal.       atophy r arm muscles due to broken , dislocated shoulder prosthesis on right  Skin: Skin is warm and dry.  Psychiatric: He has a normal mood and affect. His behavior is normal. Judgment and thought content normal.   [facility] reading (PRIMARY) by  Dr. Perrin Maltese cxr normal EKG stable for him PFT         Assessment & Plan:  Reviewed each pain medicine RF all meds 1 year See cardiologist Right shoulder prosthesis broken and dislocated causes pain and depression. Hes worried his heart and brain could not stand 3-4 hrs of anesthesia.

## 2011-08-29 NOTE — Progress Notes (Signed)
  Subjective:    Patient ID: [name] [name], male    DOB: [birth year], 71 y.o.   MRN: [id number]  HPI    Review of Systems     Objective:   Physical Exam        Assessment & Plan:

## 2011-08-30 LAB — COMPREHENSIVE METABOLIC PANEL
ALT: 20 U/L (ref 0–53)
BUN: 15 mg/dL (ref 6–23)
CO2: 27 mEq/L (ref 19–32)
Chloride: 106 mEq/L (ref 96–112)
Glucose, Bld: 96 mg/dL (ref 70–99)
Potassium: 4.2 mEq/L (ref 3.5–5.3)
Total Protein: 6.5 g/dL (ref 6.0–8.3)

## 2011-08-30 LAB — TSH: TSH: 1.424 u[IU]/mL (ref 0.350–4.500)

## 2011-08-30 LAB — LIPID PANEL: Cholesterol: 153 mg/dL (ref 0–200)

## 2011-08-31 ENCOUNTER — Encounter: Payer: Self-pay | Admitting: Radiology

## 2011-09-08 LAB — IFOBT (OCCULT BLOOD): IFOBT: NEGATIVE

## 2011-10-11 ENCOUNTER — Telehealth: Payer: Self-pay

## 2011-10-11 MED ORDER — GABAPENTIN 300 MG PO CAPS: ORAL_CAPSULE | ORAL | Status: DC

## 2011-10-11 NOTE — Telephone Encounter (Signed)
Pt states that he needs a refill on gabapentin, also pt states that he is now taking 4 a day. Pharmacy: [facility] Best# [phone]

## 2011-10-11 NOTE — Telephone Encounter (Signed)
Patient notified

## 2011-10-11 NOTE — Telephone Encounter (Signed)
Rx done and sent to pharmacy 

## 2011-10-15 DIAGNOSIS — M19019 Primary osteoarthritis, unspecified shoulder: Secondary | ICD-10-CM | POA: Diagnosis not present

## 2011-10-20 ENCOUNTER — Telehealth: Payer: Self-pay

## 2011-10-20 MED ORDER — GABAPENTIN 300 MG PO CAPS: ORAL_CAPSULE | ORAL | Status: DC

## 2011-10-20 NOTE — Telephone Encounter (Signed)
90 day supply sent in 

## 2011-10-20 NOTE — Telephone Encounter (Signed)
Takes Gabapentin 300mg  one qid wants [facility] day supply [facility] please advise.

## 2011-10-20 NOTE — Telephone Encounter (Signed)
Pt is needing to talk with dr guest about changing his medications from a 30-day supply to 90 [facility] The medication is gabapentin   [facility] number [phone]

## 2011-10-20 NOTE — Telephone Encounter (Signed)
Patient notified

## 2011-10-24 ENCOUNTER — Other Ambulatory Visit: Payer: Self-pay | Admitting: Physician Assistant

## 2011-10-27 ENCOUNTER — Other Ambulatory Visit: Payer: Self-pay | Admitting: Orthopedic Surgery

## 2011-10-27 ENCOUNTER — Other Ambulatory Visit: Payer: Self-pay | Admitting: Urology

## 2011-10-27 DIAGNOSIS — N400 Enlarged prostate without lower urinary tract symptoms: Secondary | ICD-10-CM | POA: Diagnosis not present

## 2011-10-27 DIAGNOSIS — R972 Elevated prostate specific antigen [PSA]: Secondary | ICD-10-CM | POA: Diagnosis not present

## 2011-10-27 DIAGNOSIS — N4340 Spermatocele of epididymis, unspecified: Secondary | ICD-10-CM | POA: Diagnosis not present

## 2011-10-27 DIAGNOSIS — N5089 Other specified disorders of the male genital organs: Secondary | ICD-10-CM

## 2011-10-28 ENCOUNTER — Encounter: Payer: Self-pay | Admitting: Gastroenterology

## 2011-10-29 ENCOUNTER — Ambulatory Visit
Admission: RE | Admit: 2011-10-29 | Discharge: 2011-10-29 | Disposition: A | Discharge status: Home / Self Care | Payer: Medicare Other | Source: Ambulatory Visit | Attending: Urology | Admitting: Urology

## 2011-10-29 DIAGNOSIS — N508 Other specified disorders of male genital organs: Secondary | ICD-10-CM | POA: Diagnosis not present

## 2011-10-29 DIAGNOSIS — N5089 Other specified disorders of the male genital organs: Secondary | ICD-10-CM

## 2011-11-12 ENCOUNTER — Other Ambulatory Visit: Payer: Self-pay | Admitting: Orthopedic Surgery

## 2011-11-12 DIAGNOSIS — M25511 Pain in right shoulder: Secondary | ICD-10-CM

## 2011-11-18 ENCOUNTER — Ambulatory Visit (INDEPENDENT_AMBULATORY_CARE_PROVIDER_SITE_OTHER): Payer: Medicare Other | Admitting: Cardiology

## 2011-11-18 ENCOUNTER — Encounter: Payer: Self-pay | Admitting: Cardiology

## 2011-11-18 VITALS — BP 126/80 | HR 69 | Ht 70.0 in | Wt 243.0 lb

## 2011-11-18 DIAGNOSIS — I519 Heart disease, unspecified: Secondary | ICD-10-CM

## 2011-11-18 DIAGNOSIS — I10 Essential (primary) hypertension: Secondary | ICD-10-CM | POA: Diagnosis not present

## 2011-11-18 DIAGNOSIS — I7779 Dissection of other artery: Secondary | ICD-10-CM

## 2011-11-18 DIAGNOSIS — R55 Syncope and collapse: Secondary | ICD-10-CM

## 2011-11-18 DIAGNOSIS — R0789 Other chest pain: Secondary | ICD-10-CM

## 2011-11-18 DIAGNOSIS — I4949 Other premature depolarization: Secondary | ICD-10-CM

## 2011-11-18 DIAGNOSIS — G4733 Obstructive sleep apnea (adult) (pediatric): Secondary | ICD-10-CM

## 2011-11-18 NOTE — Progress Notes (Signed)
HPI Mr Clemans comes in today for preoperative clearance for complex right shoulder surgery. He is going to have this done in [place].  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  He is having no symptoms of angina or ischemia. He denies orthopnea, PND or edema. He still fairly active though his mobility is limited from traumatic injuries.  He is very compliant with his medications. Verapamil has controlled his palpitations as well as chest pain quite well. His blood pressure has been under good control.  He was just put in the [employer]: [place].    Past Medical History  Diagnosis Date  . HTN (hypertension)   . PVC (premature ventricular contraction)   . History of agent Orange exposure   . Retinopathy     related to PVC's  . Dissection of carotid artery     history  . Dissection of renal artery     history  . GERD (gastroesophageal reflux disease)   . History of hepatitis 1969    [place], unknown what type  . Anal fissure   . Anxiety disorder   . Arthritis   . Asthma   . Adenomatous colon polyp   . Diverticulosis   . Gallstones   . Cardiomyopathy     EF of 40-45%    Current Outpatient Prescriptions  Medication Sig Dispense Refill  . albuterol (PROAIR HFA) 108 (90 BASE) MCG/ACT inhaler Inhale 2 puffs into the lungs every 4 (four) hours as needed for wheezing.  3 Inhaler  0  . ALPRAZolam (XANAX) 0.25 MG tablet Take 0.25 mg by mouth 3 (three) times daily as needed.       . Ascorbic Acid (VITAMIN C) 1000 MG tablet Take 1,000 mg by mouth daily.        Marland Kitchen aspirin 325 MG EC tablet Take 162.5 mg by mouth daily.       . celecoxib (CELEBREX) 200 MG capsule       . cholecalciferol (VITAMIN D) 1000 UNITS tablet Take 2,000 Units by mouth daily.       . fluticasone  (FLONASE) 50 MCG/ACT nasal spray       . gabapentin (NEURONTIN) 300 MG capsule Take 4 capsules daily  360 capsule  1  . HYDROcodone-acetaminophen (NORCO) 5-325 MG per tablet Take 1 tablet by mouth every 8 (eight) hours as needed.  90 tablet  3  . HYDROmorphone HCl (DILAUDID PO) Take by mouth. AS DIRECTED      . lidocaine (LIDODERM) 5 % Place 1 patch onto the skin daily as needed. As needed for painRemove & Discard patch within 12 hours or as directed by MD  90 patch  1  . lisinopril (PRINIVIL,ZESTRIL) 20 MG tablet Take 1 tablet (20 mg total) by mouth 2 (two) times daily.  180 tablet  1  . nystatin-triamcinolone ointment (MYCOLOG) Apply 1 application topically 3 (three) times daily.      . ondansetron (ZOFRAN) 4 MG tablet Take 4 mg by mouth every 8 (eight) hours as needed. For naseua      . oxyCODONE-acetaminophen (PERCOCET) 10-325 MG per tablet Take 1 tablet by mouth every 4 (four) hours as needed. IF NEEDED      . predniSONE (DELTASONE) 10 MG tablet TAKE 2-3 TABS 2-3 DAYS FOR PAIN      .  trolamine salicylate (ASPERCREME) 10 % cream Apply 1 application topically 4 (four) times daily. For pain      . verapamil (CALAN-SR) 120 MG CR tablet Take 1 tablet (120 mg total) by mouth daily.  90 tablet  3  . vitamin B-12 (CYANOCOBALAMIN) 100 MCG tablet Take 50 mcg by mouth daily.        Allergies  Allergen Reactions  . Shrimp (Shellfish Allergy) Anaphylaxis  . Contrast Media (Iodinated Diagnostic Agents)   . Morphine Hives  . Oxycodone-Acetaminophen Nausea And Vomiting  . Penicillins Swelling and Rash    Family History  Problem Relation Age of Onset  . Heart disease Father   . Colon cancer      uncle    History   Social History  . Marital Status: Married    Spouse Name: N/A    Number of Children: 3  . Years of Education: N/A   Occupational History  . 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 Smoking status: Former Games developer  . Smokeless tobacco:  Not on file  . Alcohol Use: Yes     0-3 daily  . Drug Use: No  . Sexually Active: Not on file   Other Topics Concern  . 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 Perrin Maltese    ROS ALL NEGATIVE EXCEPT THOSE NOTED IN HPI  PE  General Appearance: well developed, well nourished in no acute distress, muscular, overweight HEENT: symmetrical face, PERRLA, good dentition  Neck: no JVD, thyromegaly, or adenopathy, trachea midline Chest: symmetric without deformity Cardiac: PMI non-displaced, RRR, normal S1, S2, no gallop or murmur, ectopy on exam Lung: clear to ausculation and percussion Vascular: all pulses full without bruits  Abdominal: nondistended, nontender, good bowel sounds, no HSM, no bruits Extremities: no cyanosis, clubbing or edema, no sign of DVT, no varicosities  Skin: normal color, no rashes Neuro: alert and oriented x 3, non-focal Pysch: normal affect  EKG Normal sinus rhythm with frequent unifocal PVCs, nonspecific ST segment changes, no acute changes.  BMET    Component Value Date/Time   NA 139 08/29/2011 0915   K 4.2 08/29/2011 0915   CL 106 08/29/2011 0915   CO2 27 08/29/2011 0915   GLUCOSE 96 08/29/2011 0915   BUN 15 08/29/2011 0915   CREATININE 0.79 08/29/2011 0915   CREATININE 0.78 06/14/2011 0141   CALCIUM 9.5 08/29/2011 0915   GFRNONAA >90 06/14/2011 0141   GFRAA >90 06/14/2011 0141    Lipid Panel     Component Value Date/Time   CHOL 153 08/29/2011 0915   TRIG 94 08/29/2011 0915   HDL 45 08/29/2011 0915   CHOLHDL 3.4 08/29/2011 0915   VLDL 19 08/29/2011 0915   LDLCALC 89 08/29/2011 0915    CBC    Component Value Date/Time   WBC 5.5 08/29/2011 0918   WBC 7.0 06/14/2011 0141   RBC 5.10 08/29/2011 0918   RBC 4.73 06/14/2011 0141   HGB 15.2 08/29/2011 0918   HGB 14.7 06/14/2011 0141   HCT 47.8 08/29/2011 0918   HCT 42.2 06/14/2011  0141   PLT 189 06/14/2011 0141   MCV 93.7 08/29/2011 0918   MCV 89.2 06/14/2011 0141   MCH 29.8 08/29/2011 0918   MCH 31.1 06/14/2011 0141   MCHC 31.8 08/29/2011 6045  MCHC 34.8 06/14/2011 0141   RDW 12.6 06/14/2011 0141   LYMPHSABS 1.0 06/14/2011 0141   MONOABS 0.4 06/14/2011 0141   EOSABS 0.0 06/14/2011 0141   BASOSABS 0.0 06/14/2011 0141

## 2011-11-18 NOTE — Assessment & Plan Note (Signed)
This is nonischemic discomfort. It really relates to his ventricular ectopy. This is a stable problem. He is cleared for complex shoulder surgery at low cardiovascular risk.

## 2011-11-18 NOTE — Patient Instructions (Addendum)
**Note De-Identified  Obfuscation** Your physician recommends that you continue on your current medications as directed. Please refer to the Current Medication list given to you today.  You are being referred to Dr. Shelle Iron for sleep apnea  Your physician wants you to follow-up in: 1 year. You will receive a reminder letter in the mail two months in advance. If you don't receive a letter, please call our office to schedule the follow-up appointment.

## 2011-11-18 NOTE — Assessment & Plan Note (Signed)
He has a history of mild sleep apnea by sleep study in 2010. I'll refer him to Dr. Shelle Iron since he is now interested in wearing CPAP. This certainly will help his risk of dysrhythmias not to mention swings in his blood pressure as well as the risk of stroke and cardiac arrest.

## 2011-11-18 NOTE — Assessment & Plan Note (Signed)
Last echocardiogram was less than a year ago. He is stable clinically. Have cleared for surgery at low operative risk. He would need postoperative telemetry as well as monitoring of his oxygen levels. It will be okay to discontinue his aspirin prior surgery but not his verapamil. He is not on a beta blocker because of significant bradycardia and intolerance in the past. He does better on verapamil as evaluated by Dr. Graciela Husbands of electrophysiology in the past. No changes made today.

## 2011-11-25 ENCOUNTER — Other Ambulatory Visit (HOSPITAL_COMMUNITY): Payer: Medicare Other

## 2011-11-26 ENCOUNTER — Other Ambulatory Visit: Payer: Medicare Other

## 2011-11-26 DIAGNOSIS — Z96619 Presence of unspecified artificial shoulder joint: Secondary | ICD-10-CM | POA: Diagnosis not present

## 2011-11-26 DIAGNOSIS — M25519 Pain in unspecified shoulder: Secondary | ICD-10-CM | POA: Diagnosis not present

## 2011-11-28 ENCOUNTER — Other Ambulatory Visit: Payer: Self-pay | Admitting: *Deleted

## 2011-11-28 ENCOUNTER — Ambulatory Visit
Admission: RE | Admit: 2011-11-28 | Discharge: 2011-11-28 | Disposition: A | Discharge status: Home / Self Care | Payer: Medicare Other | Source: Ambulatory Visit | Attending: *Deleted | Admitting: *Deleted

## 2011-11-28 DIAGNOSIS — M25519 Pain in unspecified shoulder: Secondary | ICD-10-CM | POA: Diagnosis not present

## 2011-12-02 ENCOUNTER — Inpatient Hospital Stay: Admit: 2011-12-02 | Payer: Self-pay | Admitting: Orthopedic Surgery

## 2011-12-02 SURGERY — ARTHROPLASTY, SHOULDER, TOTAL, REVERSE
Anesthesia: Choice | Site: Shoulder | Laterality: Right

## 2011-12-18 ENCOUNTER — Encounter: Payer: Self-pay | Admitting: Pulmonary Disease

## 2011-12-18 ENCOUNTER — Ambulatory Visit (INDEPENDENT_AMBULATORY_CARE_PROVIDER_SITE_OTHER): Payer: Medicare Other | Admitting: Pulmonary Disease

## 2011-12-18 VITALS — BP 124/78 | HR 58 | Temp 98.3°F | Ht 70.5 in | Wt 248.0 lb

## 2011-12-18 DIAGNOSIS — G4733 Obstructive sleep apnea (adult) (pediatric): Secondary | ICD-10-CM

## 2011-12-18 NOTE — Progress Notes (Signed)
Subjective:    Patient ID: [name] [name], male    DOB: 1940/09/30, 72 y.o.   MRN: [id number]  HPI The patient is a 71 year old male who I've been asked to see for obstructive sleep apnea.  He underwent a sleep study in 2010 which showed an AHI of 6 per hour, and desaturation as low as 87%.  He has been noted to have loud snoring, as well as an abnormal breathing pattern during sleep.  However, the patient's weight has not increased significantly since his last study.  His sleep issues are complicated by a very erratic sleep schedule, chronic pain, pain medications during the day, PTSD, as well as napping during the day.  The patient will typically only sleeps 5 hours during the night and then will get further sleep during the day as he can.  He feels rested in the mornings once he has taken a shower.  However, he falls asleep very easily if he sits down for any period of time.  He has difficulty staying awake for movies, and does feel sleep pressure with driving.  His Epworth sleepiness score today is abnormal at 13.  Sleep Questionnaire: What time do you typically go to bed?( Between what hours) 9:30pm How long does it take you to fall asleep? 2 minutes How many times during the night do you wake up? 4 What time do you get out of bed to start your day? 0330 Do you drive or operate heavy machinery in your occupation? No How much has your weight changed (up or down) over the past two years? (In pounds) 4 lb (1.814 kg) Have you ever had a sleep study before? Yes If yes, location of study? [facility] If yes, date of study? Do you currently use CPAP? No Do you wear oxygen at any time? No    Review of Systems  Constitutional: Negative for fever, chills, diaphoresis, activity change, appetite change, fatigue and unexpected weight change.  HENT: Positive for congestion. Negative for hearing loss, ear pain, nosebleeds, sore throat, facial swelling, rhinorrhea, sneezing, mouth sores, trouble  swallowing, neck pain, neck stiffness, dental problem, voice change, postnasal drip, sinus pressure, tinnitus and ear discharge.   Eyes: Negative for photophobia, discharge, itching and visual disturbance.  Respiratory: Negative for apnea, cough, choking, chest tightness, shortness of breath, wheezing and stridor.   Cardiovascular: Negative for chest pain, palpitations and leg swelling.  Gastrointestinal: Negative for nausea, vomiting, abdominal pain, constipation, blood in stool and abdominal distention.  Genitourinary: Negative for dysuria, urgency, frequency, hematuria, flank pain, decreased urine volume and difficulty urinating.  Musculoskeletal: Negative for myalgias, back pain, joint swelling, arthralgias and gait problem.  Skin: Negative for color change, pallor and rash.  Neurological: Negative for dizziness, tremors, seizures, syncope, speech difficulty, weakness, light-headedness, numbness and headaches.  Hematological: Negative for adenopathy. Does not bruise/bleed easily.  Psychiatric/Behavioral: Negative for confusion, sleep disturbance and agitation. The patient is not nervous/anxious.        Objective:   Physical Exam Constitutional:  Overweight male, no acute distress  HENT:  Nares patent without discharge, deviated septum to left with narrowing.   Oropharynx without exudate, palate and uvula are elongated.   Eyes:  Perrla, eomi, no scleral icterus  Neck:  No JVD, no TMG  Cardiovascular:  Normal rate, regular rhythm, no rubs or gallops.  No murmurs        Intact distal pulses  Pulmonary :  Normal breath sounds, no stridor or respiratory distress   No rales,  rhonchi, or wheezing  Abdominal:  Soft, nondistended, bowel sounds present.  No tenderness noted.   Musculoskeletal:  No lower extremity edema noted.  Lymph Nodes:  No cervical lymphadenopathy noted  Skin:  No cyanosis noted  Neurologic:  Appears sleepy, appropriate, moves all 4 extremities without obvious  deficit.         Assessment & Plan:

## 2011-12-18 NOTE — Patient Instructions (Addendum)
Will start on cpap as a trial.  Please call if having issues with tolerance. Work on weight reduction No napping during day followup with me in 6 weeks.

## 2011-12-18 NOTE — Assessment & Plan Note (Signed)
The patient has a history of mild obstructive sleep apnea, and describes disrupted sleep at night along with daytime sleepiness.  He also has issues with chronic pain, takes pain medication during the day that makes him sleepy, has PTSD with nightmares, and also naps during the day with poor sleep hygiene.  It is really unclear how much his sleep apnea contributes to all of his symptoms.  The only way to know, is to treat his sleep apnea and see if he improves.  I would initially start with CPAP, and then he can consider other options if he sees a favorable response but has poor tolerance.  I also stressed to him the importance of aggressive weight loss, as well as what constitutes good sleep hygiene.

## 2011-12-23 ENCOUNTER — Telehealth: Payer: Self-pay

## 2011-12-23 MED ORDER — HYDROCODONE-ACETAMINOPHEN 5-325 MG PO TABS: 1.0000 | ORAL_TABLET | Freq: Three times a day (TID) | ORAL | Status: DC | PRN

## 2011-12-23 NOTE — Telephone Encounter (Signed)
Please advise on medication renewal for patient.

## 2011-12-23 NOTE — Telephone Encounter (Signed)
Patient is wanting to get a 30 day supply of Hydrocodone sent in to [facility] on [address], and a 90 day supply sent to [facility] order.  Patient states that he is completely out of his medicine and needs it to be called in for him.   [facility]#:[phone]

## 2011-12-24 ENCOUNTER — Telehealth: Payer: Self-pay | Admitting: Radiology

## 2011-12-24 NOTE — Telephone Encounter (Signed)
1 [phone] is call back number for the Hydrocodone Rx/ to go to mail order. Advised Mail order company the request was sent to Dr Perrin Maltese

## 2011-12-25 DIAGNOSIS — H2510 Age-related nuclear cataract, unspecified eye: Secondary | ICD-10-CM | POA: Diagnosis not present

## 2011-12-26 ENCOUNTER — Telehealth: Payer: Self-pay | Admitting: Pulmonary Disease

## 2011-12-26 ENCOUNTER — Other Ambulatory Visit: Payer: Self-pay | Admitting: Physician Assistant

## 2011-12-26 NOTE — Telephone Encounter (Signed)
Pt aware of [facility] recs for follow-up regarding the spot on his lung and verbalized understanding. Pt should call 6 months from the date of film done 11/28/11 so we can set up the chest ct. Pt will call in the meantime if he has any questions or concerns.

## 2011-12-26 NOTE — Telephone Encounter (Signed)
Please let pt know that I have reviewed his ct shoulder, and he does have a small spot in his right lung that is smooth and probably represents a lymph node.  However, it must be followed for 2 yrs if it does not change size.  Agree with followup at 6mos, and then probably 12-18 mos if it doesn't change.  Please call us in 6mos to arrange.  He can come by and pick his disc backup.

## 2011-12-28 ENCOUNTER — Ambulatory Visit (INDEPENDENT_AMBULATORY_CARE_PROVIDER_SITE_OTHER): Payer: Medicare Other | Admitting: Family Medicine

## 2011-12-28 VITALS — BP 137/82 | HR 72 | Temp 97.7°F | Resp 16 | Ht 70.0 in | Wt 247.0 lb

## 2011-12-28 DIAGNOSIS — T169XXA Foreign body in ear, unspecified ear, initial encounter: Secondary | ICD-10-CM | POA: Diagnosis not present

## 2011-12-28 DIAGNOSIS — H9190 Unspecified hearing loss, unspecified ear: Secondary | ICD-10-CM | POA: Diagnosis not present

## 2011-12-28 DIAGNOSIS — T162XXA Foreign body in left ear, initial encounter: Secondary | ICD-10-CM

## 2011-12-28 NOTE — Progress Notes (Signed)
[address]   [place], [place]  [address]   [phone]  Subjective:    Patient ID: [name] [name], male    DOB: November 18, 1940, 71 y.o.   MRN: [id number]  HPI This 71 y.o. male presents for evaluation of foreign body in ear (hearing aide tip).  No pain in ear.  No change in vision.  Tip of hearing aide in ear; wife could not remove.  No drainage to ear.  Feels well.  No headache.  Chronic hearing loss.  Just had hearing aides repaired; received them back yesterday. Has several tips at home for hearing aides.    Review of Systems  Constitutional: Negative for fever, chills, diaphoresis and fatigue.  HENT: Negative for ear pain.         Past Medical History  Diagnosis Date  . HTN (hypertension)   . PVC (premature ventricular contraction)   . History of agent Orange exposure   . Retinopathy     related to PVC's  . Dissection of carotid artery     history  . Dissection of renal artery     history  . GERD (gastroesophageal reflux disease)   . History of hepatitis 1969    [place], unknown what type  . Anal fissure   . Anxiety disorder   . Arthritis   . Asthma   . Adenomatous colon polyp   . Diverticulosis   . Gallstones   . Cardiomyopathy     EF of 40-45%    Past Surgical History  Procedure Date  . Appendectomy 1963  . Cervical fusion 1985  . Cholecystectomy 1990  . Tonsillectomy 1965  . Hernia repair   . Carotid disection   . Right renal artery disection   . Nose surgery   . Vasectomy   . Total shoulder replacement     Right    Prior to Admission medications   Medication Sig Start Date End Date Taking? Authorizing Provider  albuterol (PROAIR HFA) 108 (90 BASE) MCG/ACT inhaler Inhale 2 puffs into the lungs every 4 (four) hours as needed for wheezing. 07/02/11  Yes Ryan M Dunn, PA-C  ALPRAZolam Prudy Feeler) 0.25 MG tablet Take 0.25 mg by mouth 3 (three) times daily as needed.    Yes Historical Provider, MD  Ascorbic Acid (VITAMIN C) 1000 MG tablet Take 1,000 mg by  mouth daily.     Yes Historical Provider, MD  aspirin 325 MG EC tablet Take 162.5 mg by mouth daily.    Yes Historical Provider, MD  celecoxib (CELEBREX) 200 MG capsule  10/24/11  Yes Nelva Nay, PA-C  cholecalciferol (VITAMIN D) 1000 UNITS tablet Take 2,000 Units by mouth daily.    Yes Historical Provider, MD  fluticasone Aleda Grana) 50 MCG/ACT nasal spray  07/02/11  Yes Ryan M Dunn, PA-C  gabapentin (NEURONTIN) 300 MG capsule Take 4 capsules daily 10/20/11  Yes Ryan M Dunn, PA-C  HYDROcodone-acetaminophen (NORCO/VICODIN) 5-325 MG per tablet Take 1 tablet by mouth every 8 (eight) hours as needed. 12/23/11  Yes Jonita Albee, MD  HYDROcodone-acetaminophen (NORCO/VICODIN) 5-325 MG per tablet Take 1 tablet by mouth every 8 (eight) hours as needed. 12/23/11  Yes Jonita Albee, MD  HYDROmorphone HCl (DILAUDID PO) Take by mouth. AS DIRECTED   Yes Historical Provider, MD  LIDODERM 5 % APPLY 1 PATCH TO THE SKIN DAILY AS NEEDED FOR PAIN. REMOVE AND DISCARD PATCH WITHIN 12 HOURS OR AS DIRECTED BY DOCTOR. 12/26/11  Yes Ryan M Dunn, PA-C  lisinopril (PRINIVIL,ZESTRIL)  20 MG tablet Take 1 tablet (20 mg total) by mouth 2 (two) times daily. 08/05/11  Yes Anders Simmonds, PA-C  nystatin-triamcinolone ointment (MYCOLOG) Apply 1 application topically 3 (three) times daily.   Yes Historical Provider, MD  ondansetron (ZOFRAN) 4 MG tablet Take 4 mg by mouth every 8 (eight) hours as needed. For naseua   Yes Historical Provider, MD  oxyCODONE-acetaminophen (PERCOCET) 10-325 MG per tablet Take 1 tablet by mouth every 4 (four) hours as needed. IF NEEDED   Yes Historical Provider, MD  trolamine salicylate (ASPERCREME) 10 % cream Apply 1 application topically 4 (four) times daily. For pain   Yes Historical Provider, MD  verapamil (CALAN-SR) 120 MG CR tablet Take 1 tablet (120 mg total) by mouth daily. 04/01/11  Yes Gaylord Shih, MD  vitamin B-12 (CYANOCOBALAMIN) 100 MCG tablet Take 50 mcg by mouth daily.   Yes Historical  Provider, MD    Allergies  Allergen Reactions  . Shrimp (Shellfish Allergy) Anaphylaxis  . Contrast Media (Iodinated Diagnostic Agents)   . Morphine Hives  . Oxycodone-Acetaminophen Nausea And Vomiting  . Penicillins Swelling and Rash    History   Social History  . Marital Status: Married    Spouse Name: N/A    Number of Children: 3  . Years of Education: N/A   Occupational History  . 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 Smoking status: Former Smoker -- 0.5 packs/day for 15 years    Types: Cigarettes  . Smokeless tobacco: Not on file  . Alcohol Use: Yes     Comment: 0-3 daily  . Drug Use: No  . Sexually Active: Not on file   Other Topics Concern  . 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 Perrin Maltese    Family History  Problem Relation Age of Onset  . Heart disease Father   . Colon cancer      uncle    Objective:   Physical Exam  Nursing note and vitals reviewed. Constitutional: He is oriented to person, place, and time. He appears well-developed and well-nourished. No distress.  HENT:  Head: Normocephalic and atraumatic.  Left Ear: A foreign body is present.  Nose: Nose normal.       L EXTERNAL CANAL:  +FOREIGN BODY ALMOST NEARLY OCCLUDING CANAL; LOCATED DISTALLY.  Eyes: Conjunctivae normal are normal. Pupils are equal, round, and reactive to light.  Neurological: He is alert and oriented to person, place, and time.  Skin: He is not diaphoretic.  Psychiatric: He has a normal mood and affect. His behavior is normal.    PROCEDURE:  VERBAL CONSENT OBTAINED; FOREIGN BODY REMOVED WITH STERILE FORCEPS WITHOUT DIFFICULTY; PT TOLERATED PROCEDURE WELL.      Assessment & Plan:   1. Foreign body of ear, left   2. Hearing loss      1.  L external ear canal foreign body:  New.   Removed tip of hearing aide from external canal; no complications; pt tolerated well. 2. Hearing Loss B: stable without recent change; wears B hearing aides.

## 2011-12-29 NOTE — Progress Notes (Signed)
Reviewed and agree.

## 2011-12-31 ENCOUNTER — Telehealth: Payer: Self-pay | Admitting: Cardiology

## 2011-12-31 NOTE — Telephone Encounter (Signed)
I have looked back to the 2009 notes and found in an 09/2007 discharge summary where pt was ordered the sleep study for 10/26/2007. Discharge notes & ov from Dr. Shelle Iron faxed to their office for Medicare purposes. Pt is just now accepting the equipment for his sleep apnea. Mylo Red RN

## 2011-12-31 NOTE — Telephone Encounter (Signed)
Pt has an order for sleep study and Dr. Graciela Husbands is ordering Physician and they need to confirm that and notes go back as far as 2010

## 2012-01-05 ENCOUNTER — Telehealth: Payer: Self-pay | Admitting: Pulmonary Disease

## 2012-01-05 NOTE — Telephone Encounter (Signed)
This needs to be handled thru [facility].  Either there is a machine issue, or his mask is leaking like a sieve.

## 2012-01-05 NOTE — Telephone Encounter (Signed)
Spoke to [email or web address]  she will have jennifer to call pt in the am pt is aware [name] [name]

## 2012-01-05 NOTE — Telephone Encounter (Signed)
I spoke with the pt and he states he received his cpap last week and he has tried to wear it a few nights and during the day some. He states every time he uses the machine he gets an error message that states :attention high leak. He states he has taken the machine apart and put everything back together several times and still gets this message. Pt states he has contacted [facility] but they advised he call his doctor. Pt denies any issue with pressure or the machine except for the error message.  Please advise. Carron Curie, CMA

## 2012-01-16 ENCOUNTER — Telehealth: Payer: Self-pay | Admitting: Pulmonary Disease

## 2012-01-16 NOTE — Telephone Encounter (Signed)
Called and spoke with pt and he stated that he is not doing well with the cpap machine.  He stated that he has other medical problems that are going on and he stated that he is not sleeping more than 2-3 hours without waking up.  He stated that he is just not able to work around the cpap.  He stated that one nostril is blocked and the cpap blows up his nose but still feels cold to him.  He feels that this is just not the right time for him to use the cpap.   He stated that he just cant get up and down during the night with the cpap.  Any other recs for the pt?  Pt is aware that [facility] out of the office until Monday.   Allergies  Allergen Reactions  . Shrimp (Shellfish Allergy) Anaphylaxis  . Contrast Media (Iodinated Diagnostic Agents)   . Morphine Hives  . Oxycodone-Acetaminophen Nausea And Vomiting  . Penicillins Swelling and Rash

## 2012-01-19 ENCOUNTER — Other Ambulatory Visit: Payer: Self-pay | Admitting: Physician Assistant

## 2012-01-19 NOTE — Telephone Encounter (Signed)
He may be having a humidity issue, which can also cause his nose to clog.  Would consider increasing heat on humidifier to see if this will warm the air and help with nasal congestion Remind him that we discussed his sleep apnea was extremely mild, and I am beginning to think it has nothing to do with his sleep issue.  He has a lot of other reasons to have disrupted sleep, and unfortunately there is no quick fix.  He has to have his pain treated, anxiety treated, work on normalizing his sleep schedule.  I really do not have another other advice for him at this point.  If changing the humidity does not help, I would discontinue cpap at this point, and work on other issues.

## 2012-01-19 NOTE — Telephone Encounter (Signed)
The pt is aware of [facility] recs and states he has already tried turning up the humidity to 4 and this makes the water pooling even worse. He says he is working on the pain and anxiety issues and thinks he may have tried cpap too soon without addressing his other issues. Pls advise of any additional recs.

## 2012-01-19 NOTE — Telephone Encounter (Signed)
If cpap is bothering him more than helping, I would go without it for a few nights.  If he feels he sleeps better without it than with it, would discontinue.  Let me know and I can send an order to his dme to do this.

## 2012-01-19 NOTE — Telephone Encounter (Signed)
LMOMTCB x 1 

## 2012-01-19 NOTE — Telephone Encounter (Signed)
Spoke with pt and notified of recs per [facility] He verbalized understanding and states will try this and call with update Nothing further needed per pt

## 2012-01-19 NOTE — Telephone Encounter (Signed)
Pt returned call.  Call him @ [phone]. Leanora Ivanoff

## 2012-01-29 ENCOUNTER — Ambulatory Visit: Payer: Medicare Other | Admitting: Pulmonary Disease

## 2012-02-06 ENCOUNTER — Telehealth: Payer: Self-pay | Admitting: Pulmonary Disease

## 2012-02-06 NOTE — Telephone Encounter (Signed)
LMTCB x 1 

## 2012-02-09 NOTE — Telephone Encounter (Signed)
Pt is still having problems wearing cpap and doesn't think this is working for him. [facility] is out of the office until 02/12/12 and the pt is scheduled to see [facility] that day. They will discuss issues at OV.

## 2012-02-12 ENCOUNTER — Encounter: Payer: Self-pay | Admitting: Pulmonary Disease

## 2012-02-12 ENCOUNTER — Ambulatory Visit (INDEPENDENT_AMBULATORY_CARE_PROVIDER_SITE_OTHER): Payer: Medicare Other | Admitting: Pulmonary Disease

## 2012-02-12 VITALS — BP 152/108 | HR 71 | Temp 97.8°F | Ht 70.5 in | Wt 250.4 lb

## 2012-02-12 DIAGNOSIS — R911 Solitary pulmonary nodule: Secondary | ICD-10-CM | POA: Diagnosis not present

## 2012-02-12 DIAGNOSIS — G4733 Obstructive sleep apnea (adult) (pediatric): Secondary | ICD-10-CM

## 2012-02-12 NOTE — Patient Instructions (Addendum)
Will do followup ct of chest in April of this year.  Someone will be calling you to set up.  I will call you with results when available.  Will discontinue cpap. Work on improved sleep hygiene such as no napping during the day, try to cut back on sedating medications during the day if possible.

## 2012-02-12 NOTE — Assessment & Plan Note (Addendum)
The patient has a history of a 6 mm right lower lobe nodule incidentally noted on a CT of his shoulder in October of last year.  By Fleischner criteria, he will need a followup in April for this.  This most likely represents a granuloma, intrapulmonary LN, or scar.  There is nothing to suggest an infectious process.

## 2012-02-12 NOTE — Assessment & Plan Note (Signed)
The patient has a history of very mild obstructive sleep apnea, and had poor tolerance of CPAP.  He actually felt that he slept worse with the device than without it.  It was unclear to me whether his sleep apnea had anything to do with his poor sleep and also daytime sleepiness, but I suspect that it does not.  He has ongoing issues with chronic pain, PTSD, poor sleep hygiene with napping during the day, and also having to take sedating medicines during the day as well.  At this point, we'll discontinue his CPAP since he has had issues with it, and I have asked him to work aggressively on weight loss.

## 2012-02-12 NOTE — Progress Notes (Signed)
  Subjective:    Patient ID: [name] [name], male    DOB: 09/04/1940, 72 y.o.   MRN: [id number]  HPI The patient comes in today for followup of his very mild obstructive sleep apnea.  It was unclear at the last visit whether this was contributing to his symptoms at night and during the day, or whether it was more of an issue with his chronic pain, PTSD, poor sleep hygiene, and also sedating medications during the day.  The patient has been tried on CPAP, and feels that it actually worsens his sleep rather than helps it.  This doesn't surprise me given his history.  The patient has also been found to have an incidental pulmonary nodule on a CT of his shoulder, and will need a followup in April of this year.  This will be scheduled.   Review of Systems  Constitutional: Negative for fever and unexpected weight change.  HENT: Positive for rhinorrhea and postnasal drip. Negative for ear pain, nosebleeds, congestion, sore throat, sneezing, trouble swallowing, dental problem and sinus pressure.   Eyes: Negative for redness and itching.  Respiratory: Positive for cough ( productive) and wheezing. Negative for chest tightness and shortness of breath.   Cardiovascular: Positive for palpitations. Negative for leg swelling.  Gastrointestinal: Negative for nausea and vomiting.  Genitourinary: Negative for dysuria.  Musculoskeletal: Negative for joint swelling.  Skin: Negative for rash.  Neurological: Negative for headaches.  Hematological: Does not bruise/bleed easily.  Psychiatric/Behavioral: Negative for dysphoric mood. The patient is nervous/anxious ( PTSD).        Objective:   Physical Exam Overweight male in no acute distress Nose without purulence or discharge noted Neck without lymphadenopathy or thyromegaly Lower extremities with mild edema, no cyanosis Awake, but appears sleepy.  Moves all 4 extremities.       Assessment & Plan:

## 2012-02-20 ENCOUNTER — Other Ambulatory Visit: Payer: Self-pay | Admitting: Physician Assistant

## 2012-02-23 ENCOUNTER — Other Ambulatory Visit: Payer: Self-pay | Admitting: Physician Assistant

## 2012-02-26 ENCOUNTER — Other Ambulatory Visit: Payer: Self-pay

## 2012-02-26 MED ORDER — VERAPAMIL HCL ER 120 MG PO TBCR: 120.0000 mg | EXTENDED_RELEASE_TABLET | Freq: Every day | ORAL | Status: DC

## 2012-03-02 ENCOUNTER — Telehealth: Payer: Self-pay | Admitting: Radiology

## 2012-03-02 DIAGNOSIS — M25569 Pain in unspecified knee: Secondary | ICD-10-CM | POA: Diagnosis not present

## 2012-03-02 DIAGNOSIS — R209 Unspecified disturbances of skin sensation: Secondary | ICD-10-CM | POA: Diagnosis not present

## 2012-03-02 DIAGNOSIS — M25539 Pain in unspecified wrist: Secondary | ICD-10-CM | POA: Diagnosis not present

## 2012-03-02 DIAGNOSIS — R259 Unspecified abnormal involuntary movements: Secondary | ICD-10-CM | POA: Diagnosis not present

## 2012-03-02 NOTE — Telephone Encounter (Signed)
Have faxed labs to [facility] for patient, per their request [phone]

## 2012-03-03 ENCOUNTER — Other Ambulatory Visit: Payer: Self-pay | Admitting: Physician Assistant

## 2012-03-07 ENCOUNTER — Ambulatory Visit (INDEPENDENT_AMBULATORY_CARE_PROVIDER_SITE_OTHER): Payer: Medicare Other | Admitting: Internal Medicine

## 2012-03-07 VITALS — BP 156/91 | HR 59 | Temp 97.4°F | Resp 16 | Ht 70.75 in | Wt 245.6 lb

## 2012-03-07 DIAGNOSIS — E538 Deficiency of other specified B group vitamins: Secondary | ICD-10-CM

## 2012-03-07 DIAGNOSIS — G8929 Other chronic pain: Secondary | ICD-10-CM | POA: Diagnosis not present

## 2012-03-07 DIAGNOSIS — R7309 Other abnormal glucose: Secondary | ICD-10-CM | POA: Diagnosis not present

## 2012-03-07 DIAGNOSIS — I10 Essential (primary) hypertension: Secondary | ICD-10-CM

## 2012-03-07 DIAGNOSIS — Z7189 Other specified counseling: Secondary | ICD-10-CM | POA: Diagnosis not present

## 2012-03-07 LAB — POCT CBC
HCT, POC: 46.5 % (ref 43.5–53.7)
Lymph, poc: 1.7 (ref 0.6–3.4)
MCH, POC: 30 pg (ref 27–31.2)
MCV: 93.6 fL (ref 80–97)
MID (cbc): 0.4 (ref 0–0.9)
POC LYMPH PERCENT: 25.9 %L (ref 10–50)
Platelet Count, POC: 224 10*3/uL (ref 142–424)
RDW, POC: 13.6 %
WBC: 6.7 10*3/uL (ref 4.6–10.2)

## 2012-03-07 LAB — POCT GLYCOSYLATED HEMOGLOBIN (HGB A1C): Hemoglobin A1C: 5.3

## 2012-03-07 MED ORDER — CHLORTHALIDONE 25 MG PO TABS: 25.0000 mg | ORAL_TABLET | Freq: Every day | ORAL | Status: DC

## 2012-03-07 NOTE — Patient Instructions (Addendum)

## 2012-03-07 NOTE — Progress Notes (Signed)
  Subjective:    Patient ID: [name] [name], male    DOB: 08-04-1940, 72 y.o.   MRN: [id number]  HPI BP high at home, has severe shoulder pain from prosthesis right shoulder that broke into pieces. BP 150/120 at home. Dr. Sandria Manly ordered glucose,A1c, SPE.   Review of Systems     Objective:   Physical Exam  Constitutional: He is oriented to person, place, and time. He appears well-developed and well-nourished. No distress.  Cardiovascular: Normal rate, regular rhythm and normal heart sounds.   Pulmonary/Chest: Effort normal and breath sounds normal.  Musculoskeletal: He exhibits tenderness.       Right shoulder: He exhibits decreased range of motion, tenderness, bony tenderness, deformity, pain, spasm and decreased strength.  Neurological: He is alert and oriented to person, place, and time. He exhibits normal muscle tone. Coordination normal.  Psychiatric: He has a normal mood and affect. His behavior is normal.   BP 150/84 156/88 140/82        Assessment & Plan:  Chronic pain HBP not to goal Fractured shoulder prosthesis  Start chlorthalidone 12.5 q d

## 2012-03-08 LAB — COMPREHENSIVE METABOLIC PANEL
AST: 19 U/L (ref 0–37)
Albumin: 4.7 g/dL (ref 3.5–5.2)
Alkaline Phosphatase: 60 U/L (ref 39–117)
BUN: 27 mg/dL — ABNORMAL HIGH (ref 6–23)
CO2: 30 mEq/L (ref 19–32)
Creat: 0.87 mg/dL (ref 0.50–1.35)
Glucose, Bld: 91 mg/dL (ref 70–99)
Total Bilirubin: 0.9 mg/dL (ref 0.3–1.2)

## 2012-03-10 LAB — PROTEIN ELECTROPHORESIS, SERUM
Alpha-1-Globulin: 3.1 % (ref 2.9–4.9)
Alpha-2-Globulin: 9.8 % (ref 7.1–11.8)
Beta 2: 3.5 % (ref 3.2–6.5)
Gamma Globulin: 10.6 % — ABNORMAL LOW (ref 11.1–18.8)

## 2012-05-05 DIAGNOSIS — D235 Other benign neoplasm of skin of trunk: Secondary | ICD-10-CM | POA: Diagnosis not present

## 2012-05-25 ENCOUNTER — Ambulatory Visit (INDEPENDENT_AMBULATORY_CARE_PROVIDER_SITE_OTHER)
Admission: RE | Admit: 2012-05-25 | Discharge: 2012-05-25 | Disposition: A | Discharge status: Home / Self Care | Payer: Medicare Other | Source: Ambulatory Visit | Attending: Pulmonary Disease | Admitting: Pulmonary Disease

## 2012-05-25 DIAGNOSIS — R911 Solitary pulmonary nodule: Secondary | ICD-10-CM | POA: Diagnosis not present

## 2012-05-31 ENCOUNTER — Other Ambulatory Visit: Payer: Self-pay | Admitting: Pulmonary Disease

## 2012-05-31 DIAGNOSIS — R911 Solitary pulmonary nodule: Secondary | ICD-10-CM

## 2012-06-01 ENCOUNTER — Telehealth: Payer: Self-pay | Admitting: Family Medicine

## 2012-06-01 ENCOUNTER — Other Ambulatory Visit: Payer: Self-pay | Admitting: Internal Medicine

## 2012-06-01 DIAGNOSIS — G894 Chronic pain syndrome: Secondary | ICD-10-CM

## 2012-06-01 DIAGNOSIS — T7840XD Allergy, unspecified, subsequent encounter: Secondary | ICD-10-CM

## 2012-06-01 DIAGNOSIS — M1990 Unspecified osteoarthritis, unspecified site: Secondary | ICD-10-CM

## 2012-06-01 MED ORDER — PREDNISONE 10 MG PO TABS: ORAL_TABLET | ORAL | Status: DC

## 2012-06-01 MED ORDER — HYDROCODONE-ACETAMINOPHEN 5-325 MG PO TABS: 1.0000 | ORAL_TABLET | Freq: Three times a day (TID) | ORAL | Status: DC | PRN

## 2012-06-01 MED ORDER — FLUTICASONE PROPIONATE 50 MCG/ACT NA SUSP: 2.0000 | Freq: Every day | NASAL | Status: DC

## 2012-06-01 MED ORDER — ALBUTEROL SULFATE HFA 108 (90 BASE) MCG/ACT IN AERS: 2.0000 | INHALATION_SPRAY | RESPIRATORY_TRACT | Status: DC | PRN

## 2012-06-01 NOTE — Telephone Encounter (Signed)
Patient requested pharmacy change.

## 2012-06-04 ENCOUNTER — Telehealth: Payer: Self-pay | Admitting: *Deleted

## 2012-06-04 MED ORDER — GABAPENTIN 600 MG PO TABS: ORAL_TABLET | ORAL | Status: DC

## 2012-06-04 MED ORDER — LIDOCAINE 5 % EX PTCH: MEDICATED_PATCH | CUTANEOUS | Status: DC

## 2012-06-04 NOTE — Telephone Encounter (Signed)
Message copied by Monico Blitz on Fri Jun 04, 2012  9:23 AM ------      Message from: Anice Paganini      Created: Fri Jun 04, 2012  9:14 AM      Contact: Pt Sekou       Pt called is requesting 2 refills            1. gabapentin (NEURONTIN) 300 MG capsule      2. lidocaine (LIDODERM) 5 %             He gets them through [facility] and needs it to be a 90 day supply with 3 month refills. Thanks  ------

## 2012-06-04 NOTE — Telephone Encounter (Signed)
Pt called is requesting 2 refills         1. gabapentin (NEURONTIN) 300 MG capsule    2. lidocaine (LIDODERM) 5 %          He gets them through [facility] and needs it to be a 90 day supply with 3 month refills. Thanks

## 2012-06-04 NOTE — Telephone Encounter (Signed)
Former [facility] patient.  Has not been assigned new MD.  Auth refills via [facility]  

## 2012-06-22 DIAGNOSIS — H3530 Unspecified macular degeneration: Secondary | ICD-10-CM | POA: Diagnosis not present

## 2012-06-22 DIAGNOSIS — H40039 Anatomical narrow angle, unspecified eye: Secondary | ICD-10-CM | POA: Diagnosis not present

## 2012-06-22 DIAGNOSIS — H2510 Age-related nuclear cataract, unspecified eye: Secondary | ICD-10-CM | POA: Diagnosis not present

## 2012-08-04 ENCOUNTER — Other Ambulatory Visit: Payer: Self-pay | Admitting: Physician Assistant

## 2012-08-23 DIAGNOSIS — N4340 Spermatocele of epididymis, unspecified: Secondary | ICD-10-CM | POA: Diagnosis not present

## 2012-08-23 DIAGNOSIS — R972 Elevated prostate specific antigen [PSA]: Secondary | ICD-10-CM | POA: Diagnosis not present

## 2012-08-23 DIAGNOSIS — N400 Enlarged prostate without lower urinary tract symptoms: Secondary | ICD-10-CM | POA: Diagnosis not present

## 2012-09-10 DIAGNOSIS — H43819 Vitreous degeneration, unspecified eye: Secondary | ICD-10-CM | POA: Diagnosis not present

## 2012-09-10 DIAGNOSIS — H2510 Age-related nuclear cataract, unspecified eye: Secondary | ICD-10-CM | POA: Diagnosis not present

## 2012-09-15 ENCOUNTER — Encounter (INDEPENDENT_AMBULATORY_CARE_PROVIDER_SITE_OTHER): Payer: Medicare Other | Admitting: Ophthalmology

## 2012-09-15 DIAGNOSIS — H43819 Vitreous degeneration, unspecified eye: Secondary | ICD-10-CM

## 2012-09-15 DIAGNOSIS — I10 Essential (primary) hypertension: Secondary | ICD-10-CM

## 2012-09-15 DIAGNOSIS — H35039 Hypertensive retinopathy, unspecified eye: Secondary | ICD-10-CM | POA: Diagnosis not present

## 2012-09-15 DIAGNOSIS — H2510 Age-related nuclear cataract, unspecified eye: Secondary | ICD-10-CM

## 2012-09-15 DIAGNOSIS — H353290 Exudative age-related macular degeneration, unspecified eye, stage unspecified: Secondary | ICD-10-CM | POA: Diagnosis not present

## 2012-10-04 ENCOUNTER — Other Ambulatory Visit: Payer: Self-pay

## 2012-10-04 DIAGNOSIS — M1990 Unspecified osteoarthritis, unspecified site: Secondary | ICD-10-CM

## 2012-10-04 DIAGNOSIS — T7840XD Allergy, unspecified, subsequent encounter: Secondary | ICD-10-CM

## 2012-10-04 DIAGNOSIS — G894 Chronic pain syndrome: Secondary | ICD-10-CM

## 2012-10-04 MED ORDER — ALBUTEROL SULFATE HFA 108 (90 BASE) MCG/ACT IN AERS: 2.0000 | INHALATION_SPRAY | RESPIRATORY_TRACT | Status: DC | PRN

## 2012-10-13 ENCOUNTER — Encounter (INDEPENDENT_AMBULATORY_CARE_PROVIDER_SITE_OTHER): Payer: Medicare Other | Admitting: Ophthalmology

## 2012-10-13 DIAGNOSIS — H353290 Exudative age-related macular degeneration, unspecified eye, stage unspecified: Secondary | ICD-10-CM | POA: Diagnosis not present

## 2012-10-13 DIAGNOSIS — H3530 Unspecified macular degeneration: Secondary | ICD-10-CM

## 2012-10-13 DIAGNOSIS — I10 Essential (primary) hypertension: Secondary | ICD-10-CM | POA: Diagnosis not present

## 2012-10-13 DIAGNOSIS — H2510 Age-related nuclear cataract, unspecified eye: Secondary | ICD-10-CM

## 2012-10-13 DIAGNOSIS — H43819 Vitreous degeneration, unspecified eye: Secondary | ICD-10-CM

## 2012-10-13 DIAGNOSIS — H35039 Hypertensive retinopathy, unspecified eye: Secondary | ICD-10-CM

## 2012-10-15 ENCOUNTER — Other Ambulatory Visit: Payer: Self-pay | Admitting: Internal Medicine

## 2012-11-01 DIAGNOSIS — Z125 Encounter for screening for malignant neoplasm of prostate: Secondary | ICD-10-CM | POA: Diagnosis not present

## 2012-11-08 ENCOUNTER — Telehealth: Payer: Self-pay | Admitting: Neurology

## 2012-11-10 ENCOUNTER — Encounter (INDEPENDENT_AMBULATORY_CARE_PROVIDER_SITE_OTHER): Payer: Medicare Other | Admitting: Ophthalmology

## 2012-11-10 DIAGNOSIS — H3530 Unspecified macular degeneration: Secondary | ICD-10-CM | POA: Diagnosis not present

## 2012-11-10 DIAGNOSIS — H35039 Hypertensive retinopathy, unspecified eye: Secondary | ICD-10-CM | POA: Diagnosis not present

## 2012-11-10 DIAGNOSIS — I10 Essential (primary) hypertension: Secondary | ICD-10-CM | POA: Diagnosis not present

## 2012-11-10 DIAGNOSIS — H2510 Age-related nuclear cataract, unspecified eye: Secondary | ICD-10-CM

## 2012-11-10 DIAGNOSIS — H43819 Vitreous degeneration, unspecified eye: Secondary | ICD-10-CM

## 2012-11-10 DIAGNOSIS — H353290 Exudative age-related macular degeneration, unspecified eye, stage unspecified: Secondary | ICD-10-CM | POA: Diagnosis not present

## 2012-11-11 NOTE — Telephone Encounter (Signed)
Reassign to Dr. Marjory Lies.

## 2012-12-13 ENCOUNTER — Encounter (INDEPENDENT_AMBULATORY_CARE_PROVIDER_SITE_OTHER): Payer: Medicare Other | Admitting: Ophthalmology

## 2012-12-13 DIAGNOSIS — H3530 Unspecified macular degeneration: Secondary | ICD-10-CM | POA: Diagnosis not present

## 2012-12-13 DIAGNOSIS — H43819 Vitreous degeneration, unspecified eye: Secondary | ICD-10-CM

## 2012-12-13 DIAGNOSIS — I10 Essential (primary) hypertension: Secondary | ICD-10-CM | POA: Diagnosis not present

## 2012-12-13 DIAGNOSIS — H353290 Exudative age-related macular degeneration, unspecified eye, stage unspecified: Secondary | ICD-10-CM

## 2012-12-13 DIAGNOSIS — H35039 Hypertensive retinopathy, unspecified eye: Secondary | ICD-10-CM | POA: Diagnosis not present

## 2012-12-13 DIAGNOSIS — H2510 Age-related nuclear cataract, unspecified eye: Secondary | ICD-10-CM

## 2012-12-14 ENCOUNTER — Other Ambulatory Visit: Payer: Self-pay | Admitting: Internal Medicine

## 2012-12-17 ENCOUNTER — Encounter (INDEPENDENT_AMBULATORY_CARE_PROVIDER_SITE_OTHER): Payer: Medicare Other | Admitting: Ophthalmology

## 2012-12-29 DIAGNOSIS — H353290 Exudative age-related macular degeneration, unspecified eye, stage unspecified: Secondary | ICD-10-CM | POA: Diagnosis not present

## 2012-12-29 DIAGNOSIS — H43819 Vitreous degeneration, unspecified eye: Secondary | ICD-10-CM | POA: Diagnosis not present

## 2012-12-29 DIAGNOSIS — H2510 Age-related nuclear cataract, unspecified eye: Secondary | ICD-10-CM | POA: Diagnosis not present

## 2013-01-05 ENCOUNTER — Ambulatory Visit (INDEPENDENT_AMBULATORY_CARE_PROVIDER_SITE_OTHER): Payer: Medicare Other | Admitting: Cardiology

## 2013-01-05 ENCOUNTER — Telehealth: Payer: Self-pay | Admitting: Diagnostic Neuroimaging

## 2013-01-05 ENCOUNTER — Other Ambulatory Visit: Payer: Self-pay | Admitting: Radiology

## 2013-01-05 ENCOUNTER — Encounter: Payer: Self-pay | Admitting: Cardiology

## 2013-01-05 VITALS — BP 146/84 | HR 76 | Ht 70.0 in | Wt 245.0 lb

## 2013-01-05 DIAGNOSIS — M1990 Unspecified osteoarthritis, unspecified site: Secondary | ICD-10-CM

## 2013-01-05 DIAGNOSIS — T7840XD Allergy, unspecified, subsequent encounter: Secondary | ICD-10-CM

## 2013-01-05 DIAGNOSIS — I10 Essential (primary) hypertension: Secondary | ICD-10-CM | POA: Diagnosis not present

## 2013-01-05 DIAGNOSIS — G894 Chronic pain syndrome: Secondary | ICD-10-CM

## 2013-01-05 MED ORDER — LISINOPRIL 40 MG PO TABS: 40.0000 mg | ORAL_TABLET | Freq: Every day | ORAL | Status: DC

## 2013-01-05 MED ORDER — ALPRAZOLAM 0.25 MG PO TABS: 0.2500 mg | ORAL_TABLET | Freq: Three times a day (TID) | ORAL | Status: DC | PRN

## 2013-01-05 NOTE — Patient Instructions (Signed)
Fasting Lab ( CMP,Lipid )  Call to schedule [phone]   Increase Lisinopril to 40 mg daily    Your physician wants you to follow-up in: 1 year. You will receive a reminder letter in the mail two months in advance. If you don't receive a letter, please call our office to schedule the follow-up appointment.

## 2013-01-05 NOTE — Telephone Encounter (Signed)
Dr Perrin Maltese has called wanting patient to have Lortab elixer and Hydrocodone, but patient will need to pick these up, can one of you sign in his absence? He also approved Xanax, this has been called in.

## 2013-01-05 NOTE — Telephone Encounter (Signed)
called former Dr Sandria Manly patient to schedule appointment, couldn't reach pt left message to call back.

## 2013-01-05 NOTE — Progress Notes (Signed)
Patient ID: [name] [name], male   DOB: 1941-01-26, 72 y.o.   MRN: [id number]    Patient Name: [name] [name] Date of Encounter: 01/05/2013  Primary Care Provider:  Tally Due, MD Primary Cardiologist:  Tobias Alexander, H  Problem List   Past Medical History  Diagnosis Date  . HTN (hypertension)   . PVC (premature ventricular contraction)   . History of agent Orange exposure   . Retinopathy     related to PVC's  . Dissection of carotid artery     history  . Dissection of renal artery     history  . GERD (gastroesophageal reflux disease)   . History of hepatitis 1969    [place], unknown what type  . Anal fissure   . Anxiety disorder   . Arthritis   . Asthma   . Adenomatous colon polyp   . Diverticulosis   . Gallstones   . Cardiomyopathy     EF of 40-45%   Past Surgical History  Procedure Laterality Date  . Appendectomy  1963  . Cervical fusion  1985  . Cholecystectomy  1990  . Tonsillectomy  1965  . Hernia repair    . Carotid disection    . Right renal artery disection    . Nose surgery    . Vasectomy    . Total shoulder replacement      Right    Allergies  Allergies  Allergen Reactions  . Shrimp [Shellfish Allergy] Anaphylaxis  . Contrast Media [Iodinated Diagnostic Agents]   . Morphine Hives  . Oxycodone-Acetaminophen Nausea And Vomiting  . Penicillins Swelling and Rash    HPI  [name] [name] is a prior patient of Dr. wall. He is coming after one year for a followup.  The patient has a history of nonischemic cardiomyopathy with ejection fraction of 40-45%, history of hypertension, history of spontaneous dissection of his carotid artery as well as his renal artery, history of symptomatic PVCs which used to cause chest pain but not palpitations. He was started on diltiazem by Dr. Graciela Husbands with significant improvement of his symptoms.  The patient was seen a year ago by Dr. wall for preop evaluation for complex right shoulder surgery. As a part of  his preop evaluation for lung nodules was found in his right lung. This was followed 3 months later with mild enlargement of the nodule, and patient is currently waiting for another CT scan as a year followup. Because of this issue he's shoulder surgery has been postponed for now.  The patient denies symptoms of angina, shortness of breath, dizziness palpitations or syncope. He also denies symptoms of orthopnea paroxysmal nocturnal dyspnea or lower extremity swelling. He complains of tingling in his lower extremities consistent with neuropathy.   Home Medications  Prior to Admission medications   Medication Sig Start Date End Date Taking? Authorizing Provider  albuterol (PROAIR HFA) 108 (90 BASE) MCG/ACT inhaler Inhale 2 puffs into the lungs every 4 (four) hours as needed for wheezing. 10/04/12  Yes Jonita Albee, MD  ALPRAZolam Prudy Feeler) 0.25 MG tablet Take 0.25 mg by mouth 3 (three) times daily as needed.    Yes Historical Provider, MD  Ascorbic Acid (VITAMIN C) 1000 MG tablet Take 1,000 mg by mouth daily.     Yes Historical Provider, MD  aspirin 325 MG EC tablet Take 162.5 mg by mouth daily.    Yes Historical Provider, MD  celecoxib (CELEBREX) 200 MG capsule Take 200 mg by mouth every other day.  10/24/11  Yes Nelva Nay, PA-C  chlorthalidone (HYGROTON) 25 MG tablet Take 1 tablet (25 mg total) by mouth daily. 03/07/12  Yes Jonita Albee, MD  cholecalciferol (VITAMIN D) 1000 UNITS tablet Take 2,000 Units by mouth daily.    Yes Historical Provider, MD  fluticasone (FLONASE) 50 MCG/ACT nasal spray Place 2 sprays into the nose daily. 06/01/12  Yes Jonita Albee, MD  gabapentin (NEURONTIN) 600 MG tablet Take one tab po bid and 2 tabs po at night 06/04/12  Yes York Spaniel, MD  HYDROcodone-acetaminophen (NORCO/VICODIN) 5-325 MG per tablet Take 1 tablet by mouth every 8 (eight) hours as needed. 06/01/12  Yes Jonita Albee, MD  lidocaine (LIDODERM) 5 % Apply one half patch to each foot and one half  patch to one shoulder in the evening. 06/04/12  Yes York Spaniel, MD  lisinopril (PRINIVIL,ZESTRIL) 20 MG tablet Take 1 tablet (20 mg total) by mouth 2 (two) times daily. PATIENT NEEDS OFFICE VISIT FOR ADDITIONAL REFILLS - 2nd NOTICE 12/14/12  Yes Jonita Albee, MD  predniSONE (DELTASONE) 10 MG tablet Take 1 po pc bid for up to 5 days prn severe joint pain 06/01/12  Yes Jonita Albee, MD  trolamine salicylate (ASPERCREME) 10 % cream Apply 1 application topically 4 (four) times daily. For pain   Yes Historical Provider, MD  verapamil (CALAN-SR) 120 MG CR tablet Take 1 tablet (120 mg total) by mouth daily. 02/26/12  Yes Gaylord Shih, MD  vitamin B-12 (CYANOCOBALAMIN) 100 MCG tablet Take 50 mcg by mouth daily.   Yes Historical Provider, MD    Family History  Family History  Problem Relation Age of Onset  . Heart disease Father   . Colon cancer      uncle    Social History  History   Social History  . Marital Status: Married    Spouse Name: N/A    Number of Children: 3  . Years of Education: N/A   Occupational History  . 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 Smoking status: Former Smoker -- 0.50 packs/day for 15 years    Types: Cigarettes    Quit date: 02/10/1978  . Smokeless tobacco: Not on file     Comment: QUIT late 70s/early 80s  . Alcohol Use: Yes     Comment: 0-3 daily  . Drug Use: No  . Sexual Activity: Not on file   Other Topics Concern  . Not on file   Social History Narrative   Lives in [place].   Married in 1993   3 Children from previous marriage.   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 Perrin Maltese     Review of Systems, as per HPI, otherwise negative General:  No chills, fever, night sweats or weight changes.  Cardiovascular:  No chest pain, dyspnea on exertion, edema, orthopnea, palpitations, paroxysmal nocturnal dyspnea. Dermatological: No rash,  lesions/masses Respiratory: No cough, dyspnea Urologic: No hematuria, dysuria Abdominal:   No nausea, vomiting, diarrhea, bright red blood per rectum, melena, or hematemesis Neurologic:  No visual changes, wkns, changes in mental status. All other systems reviewed and are otherwise negative except as noted above.  Physical Exam  Blood pressure 146/84, pulse 76, height 5\' 10"  (1.778 m), weight 245 lb (111.131 kg).  General: Pleasant, NAD Psych: Normal affect. Neuro: Alert and oriented X 3. Moves all extremities spontaneously.  HEENT: Normal  Neck: Supple without bruits or JVD. Lungs:  Resp regular and unlabored, CTA. Heart: RRR no s3, s4, or murmurs. Abdomen: Soft, non-tender, non-distended, BS + x 4.  Extremities: No clubbing, cyanosis or edema. DP/PT/Radials 2+ and equal bilaterally.  Labs:  No results found for this basename: CKTOTAL, CKMB, TROPONINI,  in the last 72 hours Lab Results  Component Value Date   WBC 6.7 03/07/2012   HGB 14.9 03/07/2012   HCT 46.5 03/07/2012   MCV 93.6 03/07/2012   PLT 189 06/14/2011   No results found for this basename: NA, K, CL, CO2, BUN, CREATININE, CALCIUM, LABALBU, PROT, BILITOT, ALKPHOS, ALT, AST, GLUCOSE,  in the last 168 hours Lab Results  Component Value Date   CHOL 153 08/29/2011   HDL 45 08/29/2011   LDLCALC 89 08/29/2011   TRIG 94 08/29/2011   Lab Results  Component Value Date   DDIMER 0.32 06/14/2011   No components found with this basename: POCBNP,   Accessory Clinical Findings  echocardiogram  ECG - sinus rhythm, occasional PVCs, 76 beats per minute otherwise normal EKG.   Assessment & Plan  1.  nonischemic cardiomyopathy - ejection fraction 40-45%, patient is currently stable euvolemic  2. symptomatic PVCs - the patient is stable on oral diltiazem.  3. Hypertension - mildly elevated, patient states that ever measurement at home is in 140s to 150s range. We will increase lisinopril from 20-40 mg daily. He's kidney function was  normal in January 2014 we will repeat today.  4. lipid profile - not checked in over file we will order to better with liver enzymes  Followup in 1 year    Lars Masson, MD, [facility] 01/05/2013, 8:50 AM

## 2013-01-07 ENCOUNTER — Ambulatory Visit (INDEPENDENT_AMBULATORY_CARE_PROVIDER_SITE_OTHER): Payer: Medicare Other | Admitting: *Deleted

## 2013-01-07 DIAGNOSIS — Z23 Encounter for immunization: Secondary | ICD-10-CM

## 2013-01-07 MED ORDER — HYDROCODONE-ACETAMINOPHEN 5-325 MG PO TABS: 1.0000 | ORAL_TABLET | Freq: Three times a day (TID) | ORAL | Status: DC | PRN

## 2013-01-07 MED ORDER — HYDROCODONE-ACETAMINOPHEN 7.5-500 MG/15ML PO SOLN: 15.0000 mL | Freq: Four times a day (QID) | ORAL | Status: DC | PRN

## 2013-01-07 NOTE — Telephone Encounter (Signed)
Dr Perrin Maltese is now here, and these have been printed for him to sign advised patient

## 2013-01-10 ENCOUNTER — Telehealth: Payer: Self-pay | Admitting: Cardiology

## 2013-01-10 NOTE — Telephone Encounter (Signed)
New Problem  Pt called requests a Lab for Liver and Kidney Blood test// he states that he was advised to do so per Dr. Delton See on his last visit// made appt// please put in orders.

## 2013-01-10 NOTE — Telephone Encounter (Signed)
**Note De-Identified  Obfuscation** These labs were ordered at last OV with Dr Delton See on 11/26.

## 2013-01-11 ENCOUNTER — Other Ambulatory Visit (INDEPENDENT_AMBULATORY_CARE_PROVIDER_SITE_OTHER): Payer: Medicare Other

## 2013-01-11 DIAGNOSIS — I10 Essential (primary) hypertension: Secondary | ICD-10-CM

## 2013-01-11 LAB — COMPREHENSIVE METABOLIC PANEL
ALT: 24 U/L (ref 0–53)
AST: 24 U/L (ref 0–37)
Albumin: 4.1 g/dL (ref 3.5–5.2)
Alkaline Phosphatase: 61 U/L (ref 39–117)
BUN: 15 mg/dL (ref 6–23)
CO2: 28 mEq/L (ref 19–32)
Calcium: 8.9 mg/dL (ref 8.4–10.5)
Chloride: 106 mEq/L (ref 96–112)
Creatinine, Ser: 0.9 mg/dL (ref 0.4–1.5)
GFR: 93.22 mL/min (ref >60.00)
Glucose, Bld: 103 mg/dL — ABNORMAL HIGH (ref 70–99)
Potassium: 4.4 mEq/L (ref 3.5–5.1)
Sodium: 139 mEq/L (ref 135–145)
Total Bilirubin: 0.8 mg/dL (ref 0.3–1.2)
Total Protein: 6.7 g/dL (ref 6.0–8.3)

## 2013-01-11 LAB — LIPID PANEL
Cholesterol: 157 mg/dL (ref 0–200)
HDL: 48.2 mg/dL (ref >39.00)
LDL Cholesterol: 98 mg/dL (ref 0–99)
Total CHOL/HDL Ratio: 3
Triglycerides: 52 mg/dL (ref 0.0–149.0)
VLDL: 10.4 mg/dL (ref 0.0–40.0)

## 2013-01-14 ENCOUNTER — Encounter: Payer: Self-pay | Admitting: Diagnostic Neuroimaging

## 2013-01-14 ENCOUNTER — Other Ambulatory Visit: Payer: Self-pay | Admitting: Neurology

## 2013-01-14 ENCOUNTER — Ambulatory Visit (INDEPENDENT_AMBULATORY_CARE_PROVIDER_SITE_OTHER): Payer: Medicare Other | Admitting: Diagnostic Neuroimaging

## 2013-01-14 VITALS — BP 136/83 | HR 84 | Temp 97.8°F | Ht 70.5 in | Wt 250.0 lb

## 2013-01-14 DIAGNOSIS — IMO0002 Reserved for concepts with insufficient information to code with codable children: Secondary | ICD-10-CM | POA: Diagnosis not present

## 2013-01-14 DIAGNOSIS — M5416 Radiculopathy, lumbar region: Secondary | ICD-10-CM

## 2013-01-14 MED ORDER — GABAPENTIN (ONCE-DAILY) 600 MG PO TABS: 1800.0000 mg | ORAL_TABLET | Freq: Every day | ORAL | Status: DC

## 2013-01-14 NOTE — Patient Instructions (Signed)
Stop gabapentin.  Start gralise 1800mg  with dinner. Take with your evening meal.  I will prescribe neuropathy cream as well (apply to legs 3 x per day).

## 2013-01-14 NOTE — Progress Notes (Signed)
[facility]  PATIENT: [name] [name] DOB: 02-27-1940  REFERRING CLINICIAN:  HISTORY FROM: patient REASON FOR VISIT: follow up (former Dr. Sandria Manly patient)   HISTORICAL  CHIEF COMPLAINT:  Chief Complaint  Patient presents with  . Follow-up    Radiculopathy, pain    HISTORY OF PRESENT ILLNESS:  UPDATE 01/14/13: 72 year old male here for evaluation and followup of lumbar radiculopathy pain. Patient formerly evaluated by Dr. Sandria Manly. Patient is on gabapentin and Lidoderm patches for pain control. Gabapentin seems to wear off in the night. He also has tried hydrocodone to PCP with mild relief. His main complaint today is pain from his knees down to his feet. This is chronic and suspected to be related to lumbar radiculopathy.   Also of note patient has history of left ICA dissection in 1994, treated conservatively with antiplatelet therapy (aspirin). 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 Patient has history of cervical and lumbar compression fractures. Currently he is also dealing with the right shoulder pain and right lung lesion under surveillance.  PRIOR HPI (03/02/12, Dr. Sandria Manly): 72 year old right-handed white married  male with a history of  spontaneous dissection of his left internal carotid artery in 1996 associated with "numb tongue" syndrome and left-sided headache. He was treated with Coumadin therapy and  the dissection gradually resolved. He was admitted to [facility] 10/1999 with a right kidney infarct causing right sided flank pain. 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 There was also weblike stenosis of accessory left renal artery supplying  the upper pole. The stenosis was noted at  the bifurcation the vessels. There was no evidence of atherosclerotic disease in the aorta or  iliac arteries. The  Iliac arteries  were tortuous. It was felt  the underlying cause of  the abnormality in the right inferior lobar artery and accessory left renal artery was on the basis of fibromuscular dysplasia or intrinsic arteritis. Workup for collagen vascular disorder was negative. 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 He has a long history neck pain with C5-C6 fusion in 1984. He had his right shoulder replaced in 1997 with only fair results. He has a residual fracture near one of the screws and  orthopedic consultation with Dr. Doylene Canard in [place] stated it would take one and a half years and 4 operations to treat the shoulder. He had a second opinion at the [facility] and is considering surgery. He has a history of spinal compression fractures over 35 years ago.He has a bone spur at his ankle and pain with walking. He has right wrist arthritis. He has  pain in the  back of his neck soreness to touch and pain in his right shoulder all the time though there is some pain in both shoulders. He has tingling in the thumb,index, and third finger of his right hand and rarely fourth and fifth finger. He as similar symptoms in his left hand but not as severe. EMG/NCV 10/15/10 showed normal motor conduction velocity of the upper extremities and mild chronic stable changes of a right C6 radiculopathy. He has awakened at night with tremor and  his hand/arm shaking, as often as 3 times per week. He denies Lhermitte's  sign or bowel or bladder dysfunction. The numbness in his hand comes and goes. I reviewed the  MRI study of  the cervical spine 07/23/2010 showing solid interbody fusion at C5-6, DJD and spondylosis and C6-7 causing stenosis and mild foraminal narrowing  bilaterally. Mild DJD at C4- 5 with what I think is significant facet disease at C4-5 on the right. he continues to have pain. This affects his ability to walk. He has a handicapped bathroom in his home and has built a ramp. He takes  hydrocodone-acetaminophen  5-325 approximately 5 a week. He is followed by Dr. Darvin Neighbours urologist for the  question of prostate cancer.PSA 08/29/2011 was 4.2. He underwent CT scan of his right shoulder and an asymptomatic "spot" on the right lung was found. He is having a followup CT scan 2014 before considering surgery to his right shoulder at the  [facility]. He denies shortness of breath, chest pain, hemoptysis, or weight loss He complains of numbness in both hands and both feet, right hand  greater than left predominantly in the thumb index and third finger. He has burning in both feet.This can awaken him at night with burning. He is using Lidoderm patches, a half on each foot and a half on the right shoulder. He describes fingertips turning white and then blue.Blood studies 08/29/2011 were normal CBC, CMP except total bilirubin 1.6, TSH,and B12.I note  he has high blood sugars in some of Dr. Ernestene Mention notes which raises the question of diabetes. He may need hemoglobin A1c   REVIEW OF SYSTEMS: Full 14 system review of systems performed and notable only for fatigue hearing loss ringing in ears blurred vision wheezing joint pain joint swelling anxiety tremor memory loss numbness weakness insomnia.  ALLERGIES: Allergies  Allergen Reactions  . Shrimp [Shellfish Allergy] Anaphylaxis  . Contrast Media [Iodinated Diagnostic Agents]   . Morphine Hives  . Oxycodone-Acetaminophen Nausea And Vomiting  . Penicillins Swelling and Rash    HOME MEDICATIONS: Outpatient Prescriptions Prior to Visit  Medication Sig Dispense Refill  . albuterol (PROAIR HFA) 108 (90 BASE) MCG/ACT inhaler Inhale 2 puffs into the lungs every 4 (four) hours as needed for wheezing.  3 Inhaler  2  . ALPRAZolam (XANAX) 0.25 MG tablet Take 1 tablet (0.25 mg total) by mouth 3 (three) times daily as needed.  90 tablet  1  . Ascorbic Acid (VITAMIN C) 1000 MG tablet Take 1,000 mg by mouth daily.        Marland Kitchen aspirin 325 MG EC  tablet Take 162.5 mg by mouth daily.       . celecoxib (CELEBREX) 200 MG capsule Take 200 mg by mouth every other day.       . chlorthalidone (HYGROTON) 25 MG tablet Take 1 tablet (25 mg total) by mouth daily.  90 tablet  3  . cholecalciferol (VITAMIN D) 1000 UNITS tablet Take 2,000 Units by mouth daily.       . fluticasone (FLONASE) 50 MCG/ACT nasal spray Place 2 sprays into the nose daily.  16 g  11  . gabapentin (NEURONTIN) 600 MG tablet Take one tab po bid and 2 tabs po at night  360 tablet  2  . HYDROcodone-acetaminophen (NORCO/VICODIN) 5-325 MG per tablet Take 1 tablet by mouth every 8 (eight) hours as needed.  90 tablet  0  . lidocaine (LIDODERM) 5 % Apply one half patch to each foot and one half patch to one shoulder in the evening.  135 patch  2  . lisinopril (PRINIVIL,ZESTRIL) 40 MG tablet Take 1 tablet (40 mg total) by mouth daily.  90 tablet  3  .  predniSONE (DELTASONE) 10 MG tablet Take 1 po pc bid for up to 5 days prn severe joint pain  60 tablet  3  . trolamine salicylate (ASPERCREME) 10 % cream Apply 1 application topically 4 (four) times daily. For pain      . verapamil (CALAN-SR) 120 MG CR tablet Take 1 tablet (120 mg total) by mouth daily.  90 tablet  3  . HYDROcodone-acetaminophen (LORTAB) 7.5-500 MG/15ML solution Take 15 mLs by mouth every 6 (six) hours as needed for pain.  240 mL  0  . vitamin B-12 (CYANOCOBALAMIN) 100 MCG tablet Take 50 mcg by mouth daily.       No facility-administered medications prior to visit.    PAST MEDICAL HISTORY: Past Medical History  Diagnosis Date  . HTN (hypertension)   . PVC (premature ventricular contraction)   . History of agent Orange exposure   . Retinopathy     related to PVC's  . Dissection of carotid artery     history  . Dissection of renal artery     history  . GERD (gastroesophageal reflux disease)   . History of hepatitis 1969    [place], unknown what type  . Anal fissure   . Anxiety disorder   . Arthritis   . Asthma    . Adenomatous colon polyp   . Diverticulosis   . Gallstones   . Cardiomyopathy     EF of 40-45%    PAST SURGICAL HISTORY: Past Surgical History  Procedure Laterality Date  . Appendectomy  1963  . Cervical fusion  1985  . Cholecystectomy  1990  . Tonsillectomy  1965  . Hernia repair    . Carotid disection    . Right renal artery disection    . Nose surgery    . Vasectomy    . Total shoulder replacement      Right    FAMILY HISTORY: Family History  Problem Relation Age of Onset  . Heart disease Father   . Colon cancer      uncle    SOCIAL HISTORY:  History   Social History  . Marital Status: Married    Spouse Name: Clydie Braun    Number of Children: 3  . Years of Education: 16   Occupational History  . 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 Smoking status: Former Smoker -- 0.50 packs/day for 15 years    Types: Cigarettes    Quit date: 02/10/1978  . Smokeless tobacco: Never Used     Comment: QUIT late 70s/early 80s  . Alcohol Use: Yes     Comment: 0-3 daily  . Drug Use: No  . Sexual Activity: Not on file   Other Topics Concern  . Not on file   Social History Narrative   Lives in [place].   Married in 1993   3 Children from previous marriage.   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 Perrin Maltese     PHYSICAL EXAM  Filed Vitals:   01/14/13 0814  BP: 136/83  Pulse: 84  Temp: 97.8 F (36.6 C)  TempSrc: Oral  Height: 5' 10.5" (1.791 m)  Weight: 250 lb (113.399 kg)    Not recorded    Body mass index is 35.35 kg/(m^2).  GENERAL EXAM: Patient is in no distress; well developed, nourished and groomed  CARDIOVASCULAR: Regular rate and rhythm, no murmurs, no carotid  bruits  NEUROLOGIC: MENTAL STATUS: awake, alert, oriented to person, place and time, normal attention and concentration, language fluent, comprehension intact, naming intact, fund of knowledge  appropriate CRANIAL NERVE: no papilledema on fundoscopic exam, pupils equal and reactive to light, visual fields full to confrontation, extraocular muscles intact, no nystagmus, facial sensation and strength symmetric, hearing intact, palate elevates symmetrically, uvula midline, shoulder shrug symmetric, tongue midline. MOTOR: LIMITED IN RUE DUE TO RIGHT SHOULDER PAIN. OTHERWISE normal bulk and tone, full strength in the LUE, BLE SENSORY: SLIGHTLY DECR TO VIB AT TOES. OTHERWISE SYMM to light touch, temperature COORDINATION: finger-nose-finger, fine finger movements normal REFLEXES: deep tendon reflexes TRACE and symmetric; ABSENT AT TOES. GAIT/STATION: narrow based gait    DIAGNOSTIC DATA (LABS, IMAGING, TESTING) - I reviewed patient records, labs, notes, testing and imaging myself where available.  Lab Results  Component Value Date   WBC 6.7 03/07/2012   HGB 14.9 03/07/2012   HCT 46.5 03/07/2012   MCV 93.6 03/07/2012   PLT 189 06/14/2011      Component Value Date/Time   NA 139 01/11/2013 0858   K 4.4 01/11/2013 0858   CL 106 01/11/2013 0858   CO2 28 01/11/2013 0858   GLUCOSE 103* 01/11/2013 0858   BUN 15 01/11/2013 0858   CREATININE 0.9 01/11/2013 0858   CREATININE 0.87 03/07/2012 1615   CALCIUM 8.9 01/11/2013 0858   PROT 6.7 01/11/2013 0858   ALBUMIN 4.1 01/11/2013 0858   AST 24 01/11/2013 0858   ALT 24 01/11/2013 0858   ALKPHOS 61 01/11/2013 0858   BILITOT 0.8 01/11/2013 0858   GFRNONAA >90 06/14/2011 0141   GFRAA >90 06/14/2011 0141   Lab Results  Component Value Date   CHOL 157 01/11/2013   HDL 48.20 01/11/2013   LDLCALC 98 01/11/2013   TRIG 52.0 01/11/2013   CHOLHDL 3 01/11/2013   Lab Results  Component Value Date   HGBA1C 5.3 03/07/2012   Lab Results  Component Value Date   VITAMINB12 414 08/29/2011   Lab Results  Component Value Date   TSH 1.424 08/29/2011    ASSESSMENT AND PLAN  72 y.o. year old male here with:  Peripheral neuropathy by examination (? paraneoplastic from  lung lesion)  Right shoulder pain  Right wrist pain  Fibromuscular dysplasia with history of  dissection of the left internal carotid artery and right renal artery  Status post cervical fusion  Cardiomyopathy  Hypertension   PLAN: - Change gabapentin to gralise 1800mg  with dinner; patient reports gabapentin seems to wear off during the night. - Prescribed compounded neuropathy cream   No orders of the defined types were placed in this encounter.    Meds ordered this encounter  Medications  . B Complex Vitamins (VITAMIN B COMPLEX) TABS    Sig: Take 1 tablet by mouth daily.  . Multiple Vitamins-Minerals (PRESERVISION AREDS 2) CAPS    Sig: Take 1 capsule by mouth 2 (two) times daily.  . Multiple Vitamins-Minerals (ONE-A-DAY 50 PLUS PO)    Sig: Take 1 tablet by mouth daily.    No Follow-up on file.    Suanne Marker, MD 01/14/2013, 9:06 AM Certified in Neurology, Neurophysiology and Neuroimaging  [facility] [address], [address] [place], [place] [address] [phone]

## 2013-01-14 NOTE — Telephone Encounter (Signed)
Per OV, Dr Marjory Lies changed patient from Gabapentin to Gralise because Gabapentin was not lasting.

## 2013-01-17 ENCOUNTER — Encounter (INDEPENDENT_AMBULATORY_CARE_PROVIDER_SITE_OTHER): Payer: Medicare Other | Admitting: Ophthalmology

## 2013-01-17 DIAGNOSIS — H3530 Unspecified macular degeneration: Secondary | ICD-10-CM

## 2013-01-17 DIAGNOSIS — H2510 Age-related nuclear cataract, unspecified eye: Secondary | ICD-10-CM

## 2013-01-17 DIAGNOSIS — I10 Essential (primary) hypertension: Secondary | ICD-10-CM

## 2013-01-17 DIAGNOSIS — H353290 Exudative age-related macular degeneration, unspecified eye, stage unspecified: Secondary | ICD-10-CM

## 2013-01-17 DIAGNOSIS — H35039 Hypertensive retinopathy, unspecified eye: Secondary | ICD-10-CM | POA: Diagnosis not present

## 2013-01-17 DIAGNOSIS — H43819 Vitreous degeneration, unspecified eye: Secondary | ICD-10-CM

## 2013-01-21 ENCOUNTER — Telehealth: Payer: Self-pay | Admitting: *Deleted

## 2013-01-21 DIAGNOSIS — I10 Essential (primary) hypertension: Secondary | ICD-10-CM

## 2013-01-21 NOTE — Telephone Encounter (Signed)
Patient called upset stating that his lisinopril was sent in wrong yesterday post office visit. He stated that he was already taking lisinopril 20mg  bid, and that 40mg  daily was sent in, but that is the same dose from before. According to him Dr. Delton See was going to change to 40mg  bid, but that is not what the office note states. Please advise. Thanks, MI

## 2013-01-21 NOTE — Telephone Encounter (Signed)
This was sent to me in error.

## 2013-01-22 NOTE — Telephone Encounter (Signed)
HI Elnita Maxwell, Would you be able to prescribe this patient Lisinopril 40 mg po daily and call him? Thank you, Aris Lot

## 2013-01-24 MED ORDER — LISINOPRIL 40 MG PO TABS: 40.0000 mg | ORAL_TABLET | Freq: Every day | ORAL | Status: DC

## 2013-01-24 NOTE — Telephone Encounter (Signed)
Returned call to patient Dr.Nelson advised to take Lisinopril 40 mg daily.

## 2013-01-25 ENCOUNTER — Other Ambulatory Visit: Payer: Self-pay

## 2013-01-25 MED ORDER — GABAPENTIN (ONCE-DAILY) 600 MG PO TABS: 1800.0000 mg | ORAL_TABLET | Freq: Every day | ORAL | Status: DC

## 2013-01-25 NOTE — Telephone Encounter (Signed)
Patient called requesting we send a 90 day supply of Gralise to [facility], as it will be less expensive than getting a 30 day per fill through his ins.  Rx has been sent.

## 2013-02-07 ENCOUNTER — Ambulatory Visit (INDEPENDENT_AMBULATORY_CARE_PROVIDER_SITE_OTHER): Payer: Medicare Other | Admitting: Internal Medicine

## 2013-02-07 ENCOUNTER — Encounter: Payer: Self-pay | Admitting: Internal Medicine

## 2013-02-07 VITALS — BP 129/88 | HR 73 | Temp 97.5°F | Resp 18 | Ht 73.0 in | Wt 239.8 lb

## 2013-02-07 DIAGNOSIS — I429 Cardiomyopathy, unspecified: Secondary | ICD-10-CM | POA: Diagnosis not present

## 2013-02-07 DIAGNOSIS — Z79899 Other long term (current) drug therapy: Secondary | ICD-10-CM

## 2013-02-07 DIAGNOSIS — R972 Elevated prostate specific antigen [PSA]: Secondary | ICD-10-CM | POA: Diagnosis not present

## 2013-02-07 DIAGNOSIS — I10 Essential (primary) hypertension: Secondary | ICD-10-CM

## 2013-02-07 LAB — CBC WITH DIFFERENTIAL/PLATELET
Basophils Absolute: 0 10*3/uL (ref 0.0–0.1)
Basophils Relative: 1 % (ref 0–1)
Eosinophils Absolute: 0.1 10*3/uL (ref 0.0–0.7)
Eosinophils Relative: 3 % (ref 0–5)
HCT: 40.7 % (ref 39.0–52.0)
Lymphocytes Relative: 32 % (ref 12–46)
MCH: 31.1 pg (ref 26.0–34.0)
MCHC: 35.9 g/dL (ref 30.0–36.0)
MCV: 86.6 fL (ref 78.0–100.0)
Monocytes Absolute: 0.5 10*3/uL (ref 0.1–1.0)
Monocytes Relative: 12 % (ref 3–12)
Neutrophils Relative %: 52 % (ref 43–77)
Platelets: 189 10*3/uL (ref 150–400)
RDW: 13.1 % (ref 11.5–15.5)
WBC: 4.1 10*3/uL (ref 4.0–10.5)

## 2013-02-07 NOTE — Progress Notes (Signed)
   Subjective:    Patient ID: [name] [name], male    DOB: 01/06/1941, 72 y.o.   MRN: [id number]  HPI 72 yr old Caucasian is here today to review medication he is currently on.    Review of Systems     Objective:   Physical Exam        Assessment & Plan:

## 2013-02-07 NOTE — Progress Notes (Signed)
   Subjective:    Patient ID: [name] [name], male    DOB: 12-18-1940, 72 y.o.   MRN: [id number]  HPI Doing well, has questions about medications and drug interactions. Reviewed each medication in detail, new Gralise reviewed in [facility] and all drug interactions. Chart reviewed, needs CT chest in April for f/up pulmonary nodule New cardiologist Dr. Delton See, discussed her med changes. Review of Systems     Objective:   Physical Exam  Vitals reviewed. Constitutional: He is oriented to person, place, and time. He appears well-developed and well-nourished.  HENT:  Head: Normocephalic.  Eyes: EOM are normal. No scleral icterus.  Neck: Normal range of motion.  Cardiovascular: Normal rate.   Pulmonary/Chest: Effort normal.  Musculoskeletal: He exhibits tenderness.  Neurological: He is alert and oriented to person, place, and time. He exhibits normal muscle tone.  Psychiatric: He has a normal mood and affect. His behavior is normal. Judgment and thought content normal.    Labs done and past labs reviewed Dr. Danae Orleans neuro w/up reviewed in detail      Assessment & Plan:  OK to use pain meds and occ alprazolam with gralise with the cautions discussed. Will call Dr. Dione Booze about failure of injections for macular degen. To see Dr. Stann Mainland about pulmonary nodule/CT.

## 2013-02-07 NOTE — Patient Instructions (Signed)
Age-Related Macular Degeneration  Age-related macular degeneration (AMD) is a common eye disease related to aging. AMD slowly destroys the macula, which is the focusing part of the eye that provides sharp, central vision. Central vision is needed for seeing objects clearly and for daily tasks such as reading and driving. In some people, AMD advances so slowly that it has little effect on their vision as they age. In others, the disease progresses quickly and may lead to a loss of vision in one or both eyes.  There are two major types of AMD:   Dry AMD. This is the more common type. It leads to the slow breakdown of the light-sensing cells in the macula and a slow loss of central vision.   Wet AMD. As dry AMD worsens, abnormal blood vessels may begin to grow causing wet AMD. These blood vessels often leak blood and fluid under the macula because these vessels are often very fragile. This causes rapid damage to the macula that can lead to the loss of detailed central vision in a short period of time.  RISK FACTORS   Older age (50 years of age or older).   Smoking.   Being very overweight (obese).   Having a family history of AMD.   Having high cholesterol, high blood pressure, or other forms of heart disease.   Exposure to high levels of ultraviolet (UV) light and blue light.   Being Caucasian.   Being a woman.  SYMPTOMS  Dry AMD   Blurred vision, especially when reading print material. This blurred vision will often go away in brighter light.   Small, but growing blurred or blind spot in the center of your field of vision.   Decrease in the intensity of colors. Things may not seem as bright as they once were.   Decreased ability to recognize faces.   One eye may seem to be worse than the other.   Decreased ability to adapt to dimly lit rooms.  Wet AMD   All of the above symptoms, and you may notice:   That straight lines appear crooked or wavy.    A small blind spot, which can result in the loss of central vision.  DIAGNOSIS  Your caregiver will ask questions regarding recent changes to your central vision and will perform a complete eye exam. Eye drops will be placed in your eyes to enlarge (dilate) your pupils. Dilating the pupils allows your caregiver to view the back of the eye better. You may also be given a fluorescein angiogram test that injects a small amount of dye into a vein. A camera takes pictures of the dye as it travels through the blood vessels of the retina. This test can determine if you have dry or wet AMD. Lastly, you may also be asked to view an Amsler grid, which is a patterned image that looks like a checkerboard. Early changes in your central vision will cause the grid to appear distorted.   TREATMENT  There is no cure for AMD. However, the progression of the disease can be slowed. The following may help slow the progression of AMD:   Vitamins C and E, beta carotene, zinc, and antioxidants.   Laser surgery that directs a strong beam of light on the source of leakage or on new blood vessels in the eye to destroy them.   Injections of medicines into the eye. The injection slows down the formation of abnormal blood vessels that might leak.  HOME CARE INSTRUCTIONS     Schedule and keep annual eye exams. Your caregiver may recommend more frequent eye exams. Do as directed.   Take dietary supplements as directed by your caregiver.   Ask your caregiver for an Amsler grid.   Check your vision every day with an Amsler grid to find any vision changes.   Check one eye at a time by covering the eye you are not testing.  SEEK MEDICAL CARE IF:  You notice any further changes in your vision.  Document Released: 05/06/2007 Document Revised: 10/22/2011 Document Reviewed: 08/12/2011  ExitCare Patient Information 2014 [employer].

## 2013-02-10 ENCOUNTER — Encounter: Payer: Self-pay | Admitting: *Deleted

## 2013-02-11 ENCOUNTER — Other Ambulatory Visit: Payer: Self-pay | Admitting: Physician Assistant

## 2013-02-20 ENCOUNTER — Other Ambulatory Visit: Payer: Self-pay | Admitting: Cardiology

## 2013-02-21 ENCOUNTER — Telehealth: Payer: Self-pay | Admitting: Diagnostic Neuroimaging

## 2013-02-21 DIAGNOSIS — R351 Nocturia: Secondary | ICD-10-CM | POA: Diagnosis not present

## 2013-02-21 DIAGNOSIS — N4340 Spermatocele of epididymis, unspecified: Secondary | ICD-10-CM | POA: Diagnosis not present

## 2013-02-21 DIAGNOSIS — N400 Enlarged prostate without lower urinary tract symptoms: Secondary | ICD-10-CM | POA: Diagnosis not present

## 2013-02-21 DIAGNOSIS — R972 Elevated prostate specific antigen [PSA]: Secondary | ICD-10-CM | POA: Diagnosis not present

## 2013-02-21 NOTE — Telephone Encounter (Signed)
Patient states that he never received the prescription for "compounded" neuropathy cream. He uses [facility] on [address].  Please call if any questions.

## 2013-02-21 NOTE — Telephone Encounter (Signed)
The compounded cream comes from a specialty pharmacy, patient will not be able to get this through [facility].   I called the compound pharmacy.  They were not able to locate the Rx.  It has been resent.  They will contact the patient within 48 hours to discuss co-pay and shipment.  I called te patient.  He is aware.

## 2013-02-28 ENCOUNTER — Encounter (INDEPENDENT_AMBULATORY_CARE_PROVIDER_SITE_OTHER): Payer: Medicare Other | Admitting: Ophthalmology

## 2013-03-04 ENCOUNTER — Other Ambulatory Visit: Payer: Self-pay | Admitting: Neurology

## 2013-03-14 DIAGNOSIS — R972 Elevated prostate specific antigen [PSA]: Secondary | ICD-10-CM | POA: Diagnosis not present

## 2013-03-17 ENCOUNTER — Ambulatory Visit (INDEPENDENT_AMBULATORY_CARE_PROVIDER_SITE_OTHER): Payer: Medicare Other | Admitting: Internal Medicine

## 2013-03-17 VITALS — BP 124/78 | HR 83 | Temp 98.1°F | Resp 17 | Ht 73.0 in | Wt 241.0 lb

## 2013-03-17 DIAGNOSIS — R059 Cough, unspecified: Secondary | ICD-10-CM

## 2013-03-17 DIAGNOSIS — R0602 Shortness of breath: Secondary | ICD-10-CM

## 2013-03-17 DIAGNOSIS — R062 Wheezing: Secondary | ICD-10-CM

## 2013-03-17 DIAGNOSIS — R05 Cough: Secondary | ICD-10-CM | POA: Diagnosis not present

## 2013-03-17 DIAGNOSIS — J209 Acute bronchitis, unspecified: Secondary | ICD-10-CM

## 2013-03-17 MED ORDER — ALBUTEROL SULFATE (2.5 MG/3ML) 0.083% IN NEBU
2.5000 mg | INHALATION_SOLUTION | Freq: Once | RESPIRATORY_TRACT | Status: AC
  Administered 2013-03-17: 2.5 mg via RESPIRATORY_TRACT

## 2013-03-17 MED ORDER — PREDNISONE 10 MG PO TABS: ORAL_TABLET | ORAL | Status: DC

## 2013-03-17 MED ORDER — IPRATROPIUM BROMIDE 0.02 % IN SOLN
0.5000 mg | Freq: Once | RESPIRATORY_TRACT | Status: AC
  Administered 2013-03-17: 0.5 mg via RESPIRATORY_TRACT

## 2013-03-17 MED ORDER — AZITHROMYCIN 500 MG PO TABS: 500.0000 mg | ORAL_TABLET | Freq: Every day | ORAL | Status: DC

## 2013-03-17 MED ORDER — HYDROCODONE-ACETAMINOPHEN 7.5-325 MG/15ML PO SOLN: 5.0000 mL | Freq: Four times a day (QID) | ORAL | Status: DC | PRN

## 2013-03-17 MED ORDER — METHYLPREDNISOLONE ACETATE 80 MG/ML IJ SUSP
80.0000 mg | Freq: Once | INTRAMUSCULAR | Status: AC
  Administered 2013-03-17: 80 mg via INTRAMUSCULAR

## 2013-03-17 NOTE — Progress Notes (Signed)
   Subjective:    Patient ID: [name] [name], male    DOB: [birth year], 73 y.o.   MRN: [id number] Patient comes into our office with a cough and wheezing that gets worst at night this has  been going on since Thanksgiving He has been using an inhaler it helps some Coughs up thick clear to yellow mucus  URI  This is a recurrent (started around thanksgiving) problem. The current episode started more than 1 month ago (almost 3 months this has been going on). The problem has been unchanged. There has been no fever. Associated symptoms include coughing, neck pain, rhinorrhea, sneezing and wheezing. He has tried inhaler use and antihistamine for the symptoms. The treatment provided mild relief.      Review of Systems  Constitutional: Positive for fatigue. Negative for fever and chills.  HENT: Positive for rhinorrhea and sneezing.   Respiratory: Positive for cough, shortness of breath and wheezing.   Musculoskeletal: Positive for neck pain.       Had a compression surgery of his neck       Objective:   Physical Exam  Vitals reviewed. Constitutional: He is oriented to person, place, and time. He appears well-developed and well-nourished. He is cooperative. He appears ill.  HENT:  Head: Normocephalic.  Right Ear: External ear normal.  Left Ear: External ear normal.  Nose: Mucosal edema, rhinorrhea and sinus tenderness present. Right sinus exhibits no maxillary sinus tenderness and no frontal sinus tenderness. Left sinus exhibits no frontal sinus tenderness.  Eyes: EOM are normal. Pupils are equal, round, and reactive to light.  Neck: Normal range of motion. Neck supple.  Cardiovascular: Normal rate.  A regularly irregular rhythm present.  Pulmonary/Chest: Not tachypneic. He has decreased breath sounds. He has wheezes. He has rhonchi. He has no rales.  Musculoskeletal: He exhibits tenderness.  Neurological: He is alert and oriented to person, place, and time. He exhibits normal muscle tone.  Coordination normal.  Psychiatric: He has a normal mood and affect. His behavior is normal.          Assessment & Plan:  Zithromax 500mg /Lortab elixir/{Prednisone 10mg  bid 1 week

## 2013-03-17 NOTE — Patient Instructions (Signed)
Chronic Asthmatic Bronchitis °Chronic asthmatic bronchitis is a complication of persistent asthma. After a period of time with asthma, some people develop airflow obstruction that is present all the time, even when not having an asthma attack. There is also persistent inflammation of the airways, and the bronchial tubes produce more mucus. Chronic asthmatic bronchitis usually is a permanent problem with the lungs. °CAUSES  °Chronic asthmatic bronchitis happens most often in people who have asthma and also smoke cigarettes. Occasionally, it can happen to a person with long-standing or severe asthma even if the person is not a smoker. °SIGNS AND SYMPTOMS  °Chronic asthmatic bronchitis usually causes symptoms of both asthma and chronic bronchitis, including:  °· Coughing. °· Increased sputum production. °· Wheezing and shortness of breath. °· Chest discomfort. °· Recurring infections. °DIAGNOSIS  °Your health care provider will take a medical history and perform a physical exam. Chronic asthmatic bronchitis is suspected when a person with asthma has abnormal results on breathing tests (pulmonary function tests) even when breathing symptoms are at their best. Other tests, such as a chest X-ray, may be performed to rule out other conditions.  °TREATMENT  °Treatment involves controlling symptoms with medicine and lifestyle changes. °· Your health care provider may prescribe asthma medicines, including inhaler and nebulizer medicines. °· Infection can be treated with medicine to kill germs (antibiotics). Serious infections may require hospitalization. These can include: °· Pneumonia. °· Sinus infections. °· Acute bronchitis.   °· Preventing infection and hospitalization is very important. Get an influenza vaccination every year as directed by your health care provider. Ask your health care provider whether you need a pneumonia vaccine. °· Ask your health care provider whether you would benefit from a pulmonary  rehabilitation program. °HOME CARE INSTRUCTIONS °· Only take over-the-counter or prescription medicine as directed by your health care provider. °· If you are a cigarette smoker, the most important thing that you can do is quit. Talk to your health care provider for help with quitting smoking. °· Avoid pollen, dust, animal dander, molds, smoke, and other things that cause attacks. °· Regular exercise is very important to help you feel better. Discuss possible exercise routines with your health care provider. °· If animal dander is the cause of asthma, you may not be able to keep pets. °· It is important that you: °· Become educated about your medical condition. °· Participate in maintaining wellness. °· Seek medical care as directed. Delay in seeking medical care could cause permanent injury and may be a risk to your life. °SEEK MEDICAL CARE IF: °· You have wheezing and shortness of breath even if taking medicine to prevent attacks. °· You have muscle aches, chest pain, or thickening of sputum. °· Your sputum changes from clear or white to yellow, green, gray, or bloody. °SEEK IMMEDIATE MEDICAL CARE IF: °· Your usual medicines do not stop your wheezing. °· You have increased coughing or shortness of breath or both. °· You have increased difficulty breathing. °· You have any problems from the medicine you are taking, such as a rash, itching, swelling, or trouble breathing. °MAKE SURE YOU:  °· Understand these instructions. °· Will watch your condition. °· Will get help right away if you are not doing well or get worse. °Document Released: 11/14/2005 Document Revised: 11/17/2012 Document Reviewed: 08/26/2012 °ExitCare® Patient Information ©2014 [employer]. ° °

## 2013-03-17 NOTE — Progress Notes (Signed)
   Subjective:    Patient ID: [name] [name], male    DOB: Apr 15, 1940, 73 y.o.   MRN: [id number]  HPI    Review of Systems     Objective:   Physical Exam        Assessment & Plan:

## 2013-03-22 ENCOUNTER — Ambulatory Visit (INDEPENDENT_AMBULATORY_CARE_PROVIDER_SITE_OTHER): Payer: Medicare Other | Admitting: Physician Assistant

## 2013-03-22 ENCOUNTER — Encounter: Payer: Self-pay | Admitting: Physician Assistant

## 2013-03-22 VITALS — BP 162/96 | HR 57 | Temp 97.5°F | Resp 20

## 2013-03-22 DIAGNOSIS — R059 Cough, unspecified: Secondary | ICD-10-CM | POA: Diagnosis not present

## 2013-03-22 DIAGNOSIS — R05 Cough: Secondary | ICD-10-CM | POA: Diagnosis not present

## 2013-03-22 DIAGNOSIS — R0602 Shortness of breath: Secondary | ICD-10-CM

## 2013-03-22 MED ORDER — MOXIFLOXACIN HCL 400 MG PO TABS: 400.0000 mg | ORAL_TABLET | Freq: Every day | ORAL | Status: DC

## 2013-03-22 MED ORDER — ALBUTEROL SULFATE (2.5 MG/3ML) 0.083% IN NEBU
2.5000 mg | INHALATION_SOLUTION | Freq: Once | RESPIRATORY_TRACT | Status: AC
  Administered 2013-03-22: 2.5 mg via RESPIRATORY_TRACT

## 2013-03-22 MED ORDER — IPRATROPIUM BROMIDE 0.02 % IN SOLN
0.5000 mg | Freq: Once | RESPIRATORY_TRACT | Status: AC
  Administered 2013-03-22: 0.5 mg via RESPIRATORY_TRACT

## 2013-03-22 MED ORDER — IPRATROPIUM BROMIDE 0.03 % NA SOLN: 2.0000 | Freq: Two times a day (BID) | NASAL | Status: DC

## 2013-03-22 NOTE — Progress Notes (Signed)
Subjective:    Patient ID: [name] [name], male    DOB: 04/02/1940, 73 y.o.   MRN: [id number]   PCP: Kennon Portela, MD  Chief Complaint  Patient presents with  . Shortness of Breath    seen for same last week   Medications, allergies, past medical history, surgical history, family history, social history and problem list reviewed and updated.  HPI  Presents with cough and SOB.  Was seen for same last week and was improving on Zpack and prednisone 10 mg BID until yesterday, when his symptoms worsened again.  He is hoping to receive another neb treatment, which helped a lot when he was here last week. His cough produces yellow/green sputum and he coughs in jags or fits.   His symptoms initially began in 12/2012. He states "I don't want an EKG or a CXR." He has a known pulmonary lesion, for which he has a follow-up CT scheduled for a few months from now.  Review of Systems  Constitutional: Negative for fever and chills.  HENT: Positive for congestion, hearing loss (bilaterally, baseline), postnasal drip and sinus pressure. Negative for facial swelling and trouble swallowing.   Eyes: Negative.   Respiratory: Positive for cough and shortness of breath. Negative for apnea, choking, chest tightness, wheezing and stridor.   Cardiovascular: Negative for chest pain, palpitations and leg swelling.  Gastrointestinal: Negative.   Genitourinary: Negative for frequency and hematuria.  Skin: Negative for rash.  Neurological: Negative for dizziness and headaches.       Objective:   Physical Exam There were no vitals taken for this visit. There is no weight on file to calculate BMI. This patient was brought back for evaluation urgently due to complaint of SOB. Well-developed, well nourished WM who is awake, alert and oriented, in NAD. HEENT: [place]/AT, PERRL, EOMI.  Sclera and conjunctiva are clear.  EAC are patent, TMs are normal in appearance. Nasal mucosa is pink and moist. OP is  clear. Neck: supple, non-tender, no lymphadenopathy, thyromegaly. Heart: irregularly irregular, no murmur Lungs: normal effort, CTA Extremities: no cyanosis, clubbing or edema. Skin: warm and dry without rash. Psychologic: good mood and appropriate affect, normal speech and behavior.  Albuterol + Atrovent neb provided, with no change in exam, but improved subjective symptoms.      Assessment & Plan:  1. Cough I suspect that this is due to post-nasal drainage secondary to sinusitis, incompletely treated by the azithromycin.  He has hydrocodone cough syrup at home, and will change the prednisone to 60 mg daily x 3, tapering over 9 days. - ipratropium (ATROVENT) 0.03 % nasal spray; Place 2 sprays into both nostrils 2 (two) times daily.  Dispense: 30 mL; Refill: 0 - moxifloxacin (AVELOX) 400 MG tablet; Take 1 tablet (400 mg total) by mouth daily.  Dispense: 10 tablet; Refill: 0 - albuterol (PROVENTIL) (2.5 MG/3ML) 0.083% nebulizer solution 2.5 mg; Take 3 mLs (2.5 mg total) by nebulization once. - ipratropium (ATROVENT) nebulizer solution 0.5 mg; Take 2.5 mLs (0.5 mg total) by nebulization once.  2. SOB (shortness of breath) Due to reactive airway changes from recent bronchitis. He has an albuterol inhaler at home to use PRN, and declines a home nebulizer for now. - albuterol (PROVENTIL) (2.5 MG/3ML) 0.083% nebulizer solution 2.5 mg; Take 3 mLs (2.5 mg total) by nebulization once. - ipratropium (ATROVENT) nebulizer solution 0.5 mg; Take 2.5 mLs (0.5 mg total) by nebulization once.   Fara Chute, PA-C Physician Assistant-Certified [facility]  [facility]  

## 2013-03-22 NOTE — Patient Instructions (Signed)
Temporarily, take the prednisone differently: Take 60 mg each morning with food for 3 days, Then 40 mg each morning with food for 3 days,  Then 20 mg each morning with food for 3 days.

## 2013-04-04 DIAGNOSIS — R972 Elevated prostate specific antigen [PSA]: Secondary | ICD-10-CM | POA: Diagnosis not present

## 2013-04-04 DIAGNOSIS — N400 Enlarged prostate without lower urinary tract symptoms: Secondary | ICD-10-CM | POA: Diagnosis not present

## 2013-04-04 DIAGNOSIS — N4340 Spermatocele of epididymis, unspecified: Secondary | ICD-10-CM | POA: Diagnosis not present

## 2013-04-22 ENCOUNTER — Ambulatory Visit (INDEPENDENT_AMBULATORY_CARE_PROVIDER_SITE_OTHER): Payer: Medicare Other | Admitting: Internal Medicine

## 2013-04-22 VITALS — BP 140/68 | HR 65 | Temp 98.0°F | Resp 16 | Ht 70.5 in | Wt 232.0 lb

## 2013-04-22 DIAGNOSIS — R059 Cough, unspecified: Secondary | ICD-10-CM | POA: Diagnosis not present

## 2013-04-22 DIAGNOSIS — R05 Cough: Secondary | ICD-10-CM | POA: Diagnosis not present

## 2013-04-22 DIAGNOSIS — J9801 Acute bronchospasm: Secondary | ICD-10-CM

## 2013-04-22 DIAGNOSIS — R0602 Shortness of breath: Secondary | ICD-10-CM | POA: Diagnosis not present

## 2013-04-22 MED ORDER — FLUTICASONE-SALMETEROL 100-50 MCG/DOSE IN AEPB: 1.0000 | INHALATION_SPRAY | Freq: Two times a day (BID) | RESPIRATORY_TRACT | Status: DC

## 2013-04-22 NOTE — Patient Instructions (Signed)
Fluticasone; Salmeterol inhalation aerosol  What is this medicine?  FLUTICASONE; SALMETEROL (floo TIK a sone; sal ME te role) inhalation is a combination of two medicines that decrease inflammation and help to open up the airways of your lungs. It is used to treat asthma. Do NOT use for an acute asthma attack.  This medicine may be used for other purposes; ask your health care provider or pharmacist if you have questions.  COMMON BRAND NAME(S): Advair HFA  What should I tell my health care provider before I take this medicine?  They need to know if you have any of these conditions:  -bone problems  -immune system problems  -diabetes  -heart disease or irregular heartbeat  -high blood pressure  -infection  -pheochromocytoma  -seizures  -thyroid disease  -worsening asthma  -an unusual or allergic reaction to fluticasone; salmeterol, other corticosteroids, other medicines, foods, dyes, or preservatives  -pregnant or trying to get pregnant  -breast-feeding  How should I use this medicine?  This medicine is inhaled through the mouth. Follow the directions on the prescription label. Shake well for 5 seconds before each use. After using the inhaler, rinse your mouth with water. Make sure not to swallow the water. Take your medicine at regular intervals. Do not take your medicine more often than directed. Do not stop taking except on your doctor's advice. Make sure that you are using your inhaler correctly. Ask you doctor or health care provider if you have any questions.  A special MedGuide will be given to you by the pharmacist with each prescription and refill. Be sure to read this information carefully each time.  Talk to your pediatrician regarding the use of this medicine in children. While this drug may be prescribed for children as young as 12 years of age for selected conditions, precautions do apply.  Overdosage: If you think you have taken too much of this medicine contact a poison control center or emergency  room at once.  NOTE: This medicine is only for you. Do not share this medicine with others.  What if I miss a dose?  If you miss a dose, use it as soon as you remember. If it is almost time for your next dose, use only that dose and continue with your regular schedule, spacing doses evenly. Do not use double or extra doses.  What may interact with this medicine?  Do not take this medicine with any of the following medications:  -MAOIs like Carbex, Eldepryl, Marplan, Nardil, and Parnate  This medicine may also interact with the following medications:  -aminophylline or theophylline  -antiviral medicines for HIV or AIDS  -diuretics  -medicines for colds  -medicines for depression or emotional conditions  -medicines for fungal infections like ketoconazole and itraconazole  -medicines for the heart like metoprolol, propanolol  -medicines for weight loss including some herbal products  -other medicine for breathing problems  -pimozide  -some antibiotics like clarithromycin, erythromycin, levofloxacin, linezolid, and telithromycin  -vaccines  This list may not describe all possible interactions. Give your health care provider a list of all the medicines, herbs, non-prescription drugs, or dietary supplements you use. Also tell them if you smoke, drink alcohol, or use illegal drugs. Some items may interact with your medicine.  What should I watch for while using this medicine?  Visit your doctor for regular check ups. Tell your doctor or health care professional if your symptoms do not get better. If your symptoms get worse or if you need your short-acting   inhalers more often, call your doctor right away. Do not use this medicine more than every 12 hours.  If you have asthma, be aware that using this medicine may increase your risk of dying from asthma-related problems. Talk to your doctor about the risks and benefits of taking this medicine. NEVER use this medicine for an acute asthma attack.  This medicine may increase  your risk of getting an infection. Tell your doctor or health care professional if you are around anyone with measles or chickenpox, or if you develop sores or blisters that do not heal properly.  What side effects may I notice from receiving this medicine?  Side effects that you should report to your doctor or health care professional as soon as possible:  -allergic reactions like skin rash or hives, swelling of the face, lips, or tongue  -changes in vision  -chest pain  -feeling faint or lightheaded, falls  -fever or chills  -irregular heartbeat  Side effects that usually do not require medical attention (report to your doctor or health care professional if they continue or are bothersome):  -coughing, hoarseness, throat irritation  -headache  -nervousness  -stomach problems  -stuffy nose  -tremors  This list may not describe all possible side effects. Call your doctor for medical advice about side effects. You may report side effects to FDA at 1-800-FDA-1088.  Where should I keep my medicine?  Keep out of the reach of children.  Store at room temperature between 15 and 30 degrees C (59 and 86 degrees F). Store inhaler with the mouthpiece down. Keep track of the number of doses used. Throw away the inhaler after 120 inhalations or after the expiration date, whichever comes first.  NOTE: This sheet is a summary. It may not cover all possible information. If you have questions about this medicine, talk to your doctor, pharmacist, or health care provider.  © 2014, Elsevier/Gold Standard. (2012-06-02 08:09:33)

## 2013-04-22 NOTE — Progress Notes (Signed)
   Subjective:    Patient ID: [name] [name], male    DOB: 1940/08/02, 73 y.o.   MRN: [id number]  HPI [name] [name] has had persistent wheezing and cough now for about 2 months, has allergy and past smoking hx. No fever, nite sweats. Is scheduled for f/up CT chest for nodule this spring. No hemoptysis. He has DOE but also has cardiomyopathy. Has OSA.  Review of Systems     Objective:   Physical Exam  Vitals reviewed. Constitutional: He is oriented to person, place, and time. He appears well-developed and well-nourished. No distress.  HENT:  Head: Normocephalic.  Right Ear: External ear normal.  Nose: Nose normal.  Eyes: EOM are normal. Pupils are equal, round, and reactive to light. No scleral icterus.  Neck: Normal range of motion.  Cardiovascular: Normal rate, regular rhythm and normal heart sounds.   Pulmonary/Chest: Effort normal. Not tachypneic. He has decreased breath sounds. He has no wheezes. He has rhonchi. He has no rales.  Oximetry 98% at rest and drops to 94% with simple walk thru the office.Marland Kitchen  PFR 450 Normal 515  Musculoskeletal: He exhibits tenderness.  Neurological: He is alert and oriented to person, place, and time. He exhibits normal muscle tone. Coordination normal.  Psychiatric: He has a normal mood and affect. His behavior is normal.     PFTs     Assessment & Plan:  SOB/DOE Trial of advair 250/50 daily Dr. Gwenette Greet to decide if [name] [name] may have COPD?

## 2013-04-28 ENCOUNTER — Other Ambulatory Visit: Payer: Self-pay | Admitting: Internal Medicine

## 2013-04-29 ENCOUNTER — Other Ambulatory Visit: Payer: Self-pay

## 2013-04-29 DIAGNOSIS — R05 Cough: Secondary | ICD-10-CM

## 2013-04-29 DIAGNOSIS — R0602 Shortness of breath: Secondary | ICD-10-CM

## 2013-04-29 DIAGNOSIS — R059 Cough, unspecified: Secondary | ICD-10-CM

## 2013-04-29 DIAGNOSIS — J9801 Acute bronchospasm: Secondary | ICD-10-CM

## 2013-04-29 MED ORDER — FLUTICASONE-SALMETEROL 100-50 MCG/DOSE IN AEPB: 1.0000 | INHALATION_SPRAY | Freq: Two times a day (BID) | RESPIRATORY_TRACT | Status: DC

## 2013-05-05 ENCOUNTER — Telehealth: Payer: Self-pay | Admitting: *Deleted

## 2013-05-05 NOTE — Telephone Encounter (Signed)
Received message from MyChart: I have been having breathing issues. Saw Dr Elder Cyphers Having chest CT on 16 April Seeing Dr Normajean Baxter Apr 20th   Left message on VM for rtn call- Advise pt to RTC if symptoms have gotten worse.

## 2013-05-05 NOTE — Telephone Encounter (Signed)
Left detailed message to RTC if having breathing difficulty or symptoms have worsened, and to please call back.

## 2013-05-06 NOTE — Telephone Encounter (Signed)
Spoke to pt , states he has improved. Saw Dr Elder Cyphers at home.  Has some wheezing, but not every day. Has albuterol for wheezing and placed on advair bid. He states his breathing is improving. Pt stated he will f/u with current scheduled  Appointments, and will RTC breathing worsens.

## 2013-05-26 ENCOUNTER — Ambulatory Visit (INDEPENDENT_AMBULATORY_CARE_PROVIDER_SITE_OTHER)
Admission: RE | Admit: 2013-05-26 | Discharge: 2013-05-26 | Disposition: A | Discharge status: Home / Self Care | Payer: Medicare Other | Source: Ambulatory Visit | Attending: Pulmonary Disease | Admitting: Pulmonary Disease

## 2013-05-26 DIAGNOSIS — R911 Solitary pulmonary nodule: Secondary | ICD-10-CM

## 2013-05-27 ENCOUNTER — Telehealth: Payer: Self-pay | Admitting: Pulmonary Disease

## 2013-05-27 NOTE — Telephone Encounter (Signed)
Please advise on CT results.  Pt has appt with [place] on 05/30/13 but if results are not good he would like to know before appt.  If results are okay he will wait until appt.  Please advise.

## 2013-05-27 NOTE — Telephone Encounter (Signed)
Called and spoke with pt and he is aware of results per [facility].  He voiced his understanding of these results.  Pt has a pending appt on Monday and he stated that he will keep this appt on Monday. Pt stated that he has been having breathing problems and coughing up green/brown sputum off and on since November. 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   Pt stated that he will keep this appt on Monday with [facility] for follow up of this.  Will forward this message back to [facility] to make him aware.

## 2013-05-27 NOTE — Telephone Encounter (Signed)
Please let pt know that his ct showed his lung nodule to be completely stable from last scan.  Since he smoked only 1/2 ppd for 89yrs, is fairly low risk.  No followup required.  He does not need a followup visit for this.  Can cancel upcoming visit.

## 2013-05-30 ENCOUNTER — Ambulatory Visit (INDEPENDENT_AMBULATORY_CARE_PROVIDER_SITE_OTHER): Payer: Medicare Other | Admitting: Pulmonary Disease

## 2013-05-30 ENCOUNTER — Encounter: Payer: Self-pay | Admitting: Pulmonary Disease

## 2013-05-30 VITALS — BP 136/88 | HR 83 | Temp 97.0°F | Ht 70.5 in | Wt 246.8 lb

## 2013-05-30 DIAGNOSIS — R059 Cough, unspecified: Secondary | ICD-10-CM

## 2013-05-30 DIAGNOSIS — R05 Cough: Secondary | ICD-10-CM | POA: Diagnosis not present

## 2013-05-30 DIAGNOSIS — R911 Solitary pulmonary nodule: Secondary | ICD-10-CM

## 2013-05-30 DIAGNOSIS — R053 Chronic cough: Secondary | ICD-10-CM

## 2013-05-30 NOTE — Patient Instructions (Signed)
Will image your sinuses in light of your chronic cough with discolored mucus. Stop lisinopril, and call your primary physician to prescribe a substitute. Will schedule for breathing studies 2-3 weeks from now to evaluate for possible copd.

## 2013-05-30 NOTE — Assessment & Plan Note (Signed)
The patient's nodule is unchanged after a CT one half years later, and therefore no further followup is required.

## 2013-05-30 NOTE — Progress Notes (Signed)
   Subjective:    Patient ID: [name] [name], male    DOB: 1941/01/05, 73 y.o.   MRN: [id number]  HPI Patient comes in today for an acute sick visit. He tells me that he has had a chronic cough since last year that is productive of moderate amounts of purulent-appearing mucus. He is also had some increased shortness of breath despite being on Advair. He does not believe this is draining out of his head posteriorly, but rather coming out of his chest. He has been treated with multiple rounds of antibiotics by his history, with no significant change. He is also had cough paroxysms that have led to worsening shortness of breath and dizziness. He has very little smoking history, and has never had pulmonary function studies.   Review of Systems  Constitutional: Negative for fever and unexpected weight change.  HENT: Negative for congestion, dental problem, ear pain, nosebleeds, postnasal drip, rhinorrhea, sinus pressure, sneezing, sore throat and trouble swallowing.   Eyes: Negative for redness and itching.  Respiratory: Positive for cough, shortness of breath and wheezing. Negative for chest tightness.   Cardiovascular: Negative for palpitations and leg swelling.  Gastrointestinal: Negative for nausea and vomiting.  Genitourinary: Negative for dysuria.  Musculoskeletal: Negative for joint swelling.  Skin: Negative for rash.  Neurological: Negative for headaches.  Hematological: Does not bruise/bleed easily.  Psychiatric/Behavioral: Negative for dysphoric mood. The patient is not nervous/anxious.        Objective:   Physical Exam Obese male in no acute distress Nose without purulence or discharge noted Oropharynx clear Neck without lymphadenopathy or thyromegaly Chest with totally clear breath sounds, mild upper airway pseudo wheezing. Cardiac exam with regular rate and rhythm Lower extremities without significant edema, no cyanosis Alert and oriented, moves all 4 extremities.        Assessment & Plan:

## 2013-05-30 NOTE — Assessment & Plan Note (Signed)
The patient has a history of chronic cough since the end of last year, and is productive of purulent-appearing mucus. He has been on multiple rounds of antibiotics with no resolution. It is unclear if he may have occult chronic sinusitis causing this, but I have told him it is unlikely that he has a persistent pulmonary infection after all of the different antibiotics that he has received. It is unclear whether he may have obstructive lung disease or chronic bronchitis, but he has very little smoking history in the past. He is on an ACE inhibitor and gives a history for cough paroxysms and near-syncope. I think this needs to be discontinued, and needs to avoid that class of medications. Will send a note to his primary physician for this. I also think he needs to stop Advair until we can sort out whether he has obstructive disease or not.

## 2013-05-31 ENCOUNTER — Ambulatory Visit (INDEPENDENT_AMBULATORY_CARE_PROVIDER_SITE_OTHER)
Admission: RE | Admit: 2013-05-31 | Discharge: 2013-05-31 | Disposition: A | Discharge status: Home / Self Care | Payer: Medicare Other | Source: Ambulatory Visit | Attending: Pulmonary Disease | Admitting: Pulmonary Disease

## 2013-05-31 DIAGNOSIS — R05 Cough: Secondary | ICD-10-CM

## 2013-05-31 DIAGNOSIS — R053 Chronic cough: Secondary | ICD-10-CM

## 2013-05-31 DIAGNOSIS — R059 Cough, unspecified: Secondary | ICD-10-CM

## 2013-05-31 DIAGNOSIS — J320 Chronic maxillary sinusitis: Secondary | ICD-10-CM | POA: Diagnosis not present

## 2013-06-09 ENCOUNTER — Ambulatory Visit (INDEPENDENT_AMBULATORY_CARE_PROVIDER_SITE_OTHER): Payer: Medicare Other | Admitting: Internal Medicine

## 2013-06-09 VITALS — BP 142/98 | HR 67 | Temp 97.8°F | Resp 16 | Ht 70.5 in | Wt 245.2 lb

## 2013-06-09 DIAGNOSIS — I10 Essential (primary) hypertension: Secondary | ICD-10-CM | POA: Diagnosis not present

## 2013-06-09 DIAGNOSIS — I428 Other cardiomyopathies: Secondary | ICD-10-CM | POA: Diagnosis not present

## 2013-06-09 DIAGNOSIS — J9801 Acute bronchospasm: Secondary | ICD-10-CM

## 2013-06-09 DIAGNOSIS — I429 Cardiomyopathy, unspecified: Secondary | ICD-10-CM

## 2013-06-09 DIAGNOSIS — Z79899 Other long term (current) drug therapy: Secondary | ICD-10-CM

## 2013-06-09 LAB — BASIC METABOLIC PANEL
BUN: 19 mg/dL (ref 6–23)
CHLORIDE: 103 meq/L (ref 96–112)
CO2: 30 meq/L (ref 19–32)
CREATININE: 0.89 mg/dL (ref 0.50–1.35)
Calcium: 9 mg/dL (ref 8.4–10.5)
Glucose, Bld: 115 mg/dL — ABNORMAL HIGH (ref 70–99)
Potassium: 3.8 mEq/L (ref 3.5–5.3)
Sodium: 141 mEq/L (ref 135–145)

## 2013-06-09 MED ORDER — CHLORTHALIDONE 25 MG PO TABS: 25.0000 mg | ORAL_TABLET | Freq: Every day | ORAL | Status: DC

## 2013-06-09 NOTE — Progress Notes (Signed)
   Subjective:    Patient ID: [name] [name], male    DOB: [birth year], 73 y.o.   MRN: [id number]  HPI Here for bp med adjustment. Pulmonary Dr. Gwenette Greet stopped his lisinopril due to chronic cough and bronchospasm. BP running 160/90 give or take a little by home monitoring. He is on verapamil for his cardiomyopathy, controls rhythm. Will need to add something but beta blocker worries me with his bronchospasm.   Review of Systems     Objective:   Physical Exam  Constitutional: He is oriented to person, place, and time. He appears well-developed and well-nourished.  HENT:  Head: Normocephalic.  Eyes: EOM are normal.  Neck: Normal range of motion.  Cardiovascular: Normal rate, regular rhythm and normal heart sounds.   Pulmonary/Chest: Effort normal. No accessory muscle usage. Not tachypneic. No respiratory distress. He has decreased breath sounds. He has no wheezes. He has rhonchi. He has no rales. He exhibits no tenderness.  Musculoskeletal: He exhibits tenderness.  Neurological: He is alert and oriented to person, place, and time. He exhibits normal muscle tone. Coordination normal.  Psychiatric: He has a normal mood and affect.   EKG normal for him        Assessment & Plan:  Start chlorthalidone 25mg  qd See Dr. Meda Coffee cardiology in about a month with home pressure records See Dr. Gwenette Greet as scheduled 1 month also

## 2013-06-09 NOTE — Patient Instructions (Signed)
Chlorthalidone tablets What is this medicine? CHLORTHALIDONE (klor THAL i done) is a diuretic. It increases the amount of urine passed, which causes the body to lose salt and water. This medicine is used to treat high blood pressure and edema or water retention. This medicine may be used for other purposes; ask your health care provider or pharmacist if you have questions. COMMON BRAND NAME(S): Thalitone  What should I tell my health care provider before I take this medicine? They need to know if you have any of these conditions: -asthma -diabetes -gout -kidney disease -liver disease -parathyroid disease -systemic lupus erythematosus (SLE) -taking cortisone, digoxin, lithium carbonate, or drugs for diabetes -an unusual or allergic reaction to chlorthalidone, sulfa drugs, other medicines, foods, dyes, or preservatives -pregnant or trying to get pregnant -breast-feeding How should I use this medicine? Take this medicine by mouth with a glass of water. Follow the directions on the prescription label. It is best to take your dose in the morning with food. Take your medicine at regular intervals. Do not take your medicine more often than directed. Do not stop taking except on your doctor's advice. Talk to your pediatrician regarding the use of this medicine in children. Special care may be needed. Overdosage: If you think you have taken too much of this medicine contact a poison control center or emergency room at once. NOTE: This medicine is only for you. Do not share this medicine with others. What if I miss a dose? If you miss a dose, take it as soon as you can. If it is almost time for your next dose, take only that dose. Do not take double or extra doses. What may interact with this medicine? -barbiturate medicines for sleep or seizure control -digoxin -lithium -medicines for diabetes -norepinephrine -other medicines for high blood pressure -some pain medicines -steroid hormones like  prednisone, cortisone, hydrocortisone, corticotropin -tubocurarine This list may not describe all possible interactions. Give your health care provider a list of all the medicines, herbs, non-prescription drugs, or dietary supplements you use. Also tell them if you smoke, drink alcohol, or use illegal drugs. Some items may interact with your medicine. What should I watch for while using this medicine? Visit your doctor or health care professional for regular check ups. Check your blood pressure as directed. Ask your doctor or health care professional what your blood pressure should be and when you should contact him or her. You may need to be on a special diet while taking this medicine. Ask your doctor. You may get drowsy or dizzy. Do not drive, use machinery, or do anything that needs mental alertness until you know how this medicine affects you. Do not stand or sit up quickly, especially if you are an older patient. This reduces the risk of dizzy or fainting spells. Alcohol may interfere with the effect of this medicine. Avoid alcoholic drinks. This medicine may affect your blood sugar level. If you have diabetes, check with your doctor or health care professional before changing the dose of your diabetic medicine. This medicine can make you more sensitive to the sun. Keep out of the sun. If you cannot avoid being in the sun, wear protective clothing and use sunscreen. Do not use sun lamps or tanning beds/booths. What side effects may I notice from receiving this medicine? Side effects that you should report to your doctor or health care professional as soon as possible: -allergic reactions like skin rash, itching or hives, swelling of the face, lips, or tongue -  dark urine -dry mouth -excess thirst -fast, irregular heart rate -fever, chills -muscle pain, cramps, or spasm -nausea, vomiting -redness, blistering, peeling or loosening of the skin, including inside the mouth -tingling, pain or  numbness in the hands or feet -unusually weak or tired -yellowing of the eyes or skin Side effects that usually do not require medical attention (report to your doctor or health care professional if they continue or are bothersome): -diarrhea or constipation -headache -impotence -loss of appetite -stomach upset This list may not describe all possible side effects. Call your doctor for medical advice about side effects. You may report side effects to FDA at 1-800-FDA-1088. Where should I keep my medicine? Keep out of the reach of children. Store at room temperature between 15 and 30 degrees C (59 and 86 degrees F). Keep container tightly closed. Throw away any unused medicine after the expiration date. NOTE: This sheet is a summary. It may not cover all possible information. If you have questions about this medicine, talk to your doctor, pharmacist, or health care provider.  2014, Elsevier/Gold Standard. (2007-05-04 15:28:48)

## 2013-06-13 ENCOUNTER — Encounter: Payer: Self-pay | Admitting: *Deleted

## 2013-06-14 ENCOUNTER — Ambulatory Visit (INDEPENDENT_AMBULATORY_CARE_PROVIDER_SITE_OTHER): Payer: Medicare Other | Admitting: Family Medicine

## 2013-06-14 VITALS — BP 140/75 | HR 71 | Temp 97.6°F | Resp 20 | Ht 70.5 in | Wt 237.0 lb

## 2013-06-14 DIAGNOSIS — I499 Cardiac arrhythmia, unspecified: Secondary | ICD-10-CM | POA: Diagnosis not present

## 2013-06-14 DIAGNOSIS — J45909 Unspecified asthma, uncomplicated: Secondary | ICD-10-CM | POA: Diagnosis not present

## 2013-06-14 MED ORDER — ALBUTEROL SULFATE (2.5 MG/3ML) 0.083% IN NEBU
2.5000 mg | INHALATION_SOLUTION | Freq: Once | RESPIRATORY_TRACT | Status: AC
  Administered 2013-06-14: 2.5 mg via RESPIRATORY_TRACT

## 2013-06-14 MED ORDER — ALBUTEROL SULFATE (2.5 MG/3ML) 0.083% IN NEBU: 2.5000 mg | INHALATION_SOLUTION | Freq: Four times a day (QID) | RESPIRATORY_TRACT | Status: DC | PRN

## 2013-06-14 NOTE — Patient Instructions (Signed)
You can try using the albuterol puffer with the spacer chamber, and/ or use the albuterol nebulizer as needed for shortness of breath.

## 2013-06-14 NOTE — Progress Notes (Signed)
[facility] [address] [place] [address] [phone]  Date:  06/14/2013   Name:  [name] [name]   DOB:  12-22-1940   MRN:  [id number]  PCP:  Kennon Portela, MD    Chief Complaint: Shortness of Breath   History of Present Illness:  [name] [name] is a 73 y.o. very pleasant male patient who presents with the following:  He has a history of "chronic asthmatic bronchitis" and is seeing Dr. Gwenette Greet for pulmonary evaluation.  He will have COPD testing later on this month.   He has had intermittent wheezing and SOB, cough for years but worse since Thanksgiving.  He has tried his rescue inhaler at home.  He was insturcted per his brother in law Dr. Elder Cyphers to come in for a breathing treatment when he has more wheezing- he feels he is at that point now.  He has had good results with nebs in the past.  Discussed prednisone but he has used this several times recently and would prefer to avoid this if possible.  He has a history of palpitations (long- standing) and tends to have frequent PVCs.  However he does not have any other significant current cardiac issues.    Patient Active Problem List   Diagnosis Date Noted  . Chronic cough 05/30/2013  . Pulmonary nodule 02/12/2012  . Syncope 06/14/2011  . Facial laceration 06/14/2011  . Anxiety disorder 06/14/2011  . Allergic rhinitis 06/14/2011  . Chronic musculoskeletal pain 06/14/2011  . Left ventricular systolic dysfunction 59/93/5701  . DYSPHAGIA UNSPECIFIED 06/13/2009  . PERSONAL HISTORY OF COLONIC POLYPS 06/13/2009  . VENTRICULAR ECTOPY 08/09/2008  . ARTERIAL DISSECTION 08/09/2008  . CHEST DISCOMFORT 08/09/2008  . OBSTRUCTIVE SLEEP APNEA 11/03/2007  . ALLERGIC RHINITIS 10/26/2007  . HYPERTENSION 10/25/2007    Past Medical History  Diagnosis Date  . HTN (hypertension)   . PVC (premature ventricular contraction)   . History of agent Orange exposure   . Retinopathy     related to PVC's  . Dissection of  carotid artery     history  . Dissection of renal artery     history  . GERD (gastroesophageal reflux disease)   . History of hepatitis 1969    [place], unknown what type  . Anal fissure   . Anxiety disorder   . Arthritis   . Asthma   . Adenomatous colon polyp   . Diverticulosis   . Gallstones   . Cardiomyopathy     EF of 40-45%    Past Surgical History  Procedure Laterality Date  . Appendectomy  1963  . Cervical fusion  1985  . Cholecystectomy  1990  . Tonsillectomy  1965  . Hernia repair    . Carotid disection    . Right renal artery disection    . Nose surgery    . Vasectomy    . Total shoulder replacement      Right    History  Substance Use Topics  . Smoking status: Former Smoker -- 0.50 packs/day for 15 years    Types: Cigarettes    Quit date: 02/10/1978  . Smokeless tobacco: Never Used     Comment: QUIT late 70s/early 46s  . Alcohol Use: Yes     Comment: 0-3 daily    Family History  Problem Relation Age of Onset  . Heart disease Father   . Colon cancer      uncle    Allergies  Allergen Reactions  . Shrimp [  Shellfish Allergy] Anaphylaxis  . Contrast Media [Iodinated Diagnostic Agents]   . Morphine Hives  . Oxycodone-Acetaminophen Nausea And Vomiting  . Penicillins Swelling and Rash    Medication list has been reviewed and updated.  Current Outpatient Prescriptions on File Prior to Visit  Medication Sig Dispense Refill  . (No Medication Selected) Apply 1 application topically 4 (four) times daily as needed. Multiple meds in compound-- Applies to lower legs and feet      . albuterol (PROAIR HFA) 108 (90 BASE) MCG/ACT inhaler Inhale 2 puffs into the lungs every 4 (four) hours as needed for wheezing.  3 Inhaler  2  . Ascorbic Acid (VITAMIN C) 1000 MG tablet Take 1,000 mg by mouth daily.        Marland Kitchen aspirin 325 MG EC tablet Take 162.5 mg by mouth daily.       . B Complex Vitamins (VITAMIN B COMPLEX) TABS Take 1 tablet by mouth daily.      .  chlorthalidone (HYGROTON) 25 MG tablet Take 1 tablet (25 mg total) by mouth daily.  90 tablet  3  . cholecalciferol (VITAMIN D) 1000 UNITS tablet Take 5,000 Units by mouth daily.       . fluticasone (FLONASE) 50 MCG/ACT nasal spray Place 2 sprays into the nose daily.  16 g  11  . Gabapentin, PHN, (GRALISE) 600 MG TABS Take 1,800 mg by mouth once.      . lidocaine (LIDODERM) 5 % APPLY ONE-HALF (1/2) PATCH TO EACH FOOT AND ONE-HALF (1/2) PATCH TO 1 SHOULDER IN THE EVENING  135 patch  1  . Multiple Vitamins-Minerals (ONE-A-DAY 50 PLUS PO) Take 1 tablet by mouth daily.      . Multiple Vitamins-Minerals (PRESERVISION AREDS 2) CAPS Take 1 capsule by mouth 2 (two) times daily.      . verapamil (CALAN-SR) 120 MG CR tablet TAKE 1 TABLET DAILY  90 tablet  2  . ALPRAZolam (XANAX) 0.25 MG tablet Take 1 tablet (0.25 mg total) by mouth 3 (three) times daily as needed.  90 tablet  1  . celecoxib (CELEBREX) 200 MG capsule Take 200 mg by mouth every other day.        No current facility-administered medications on file prior to visit.    Review of Systems:  As per HPI- otherwise negative.   Physical Examination: Filed Vitals:   06/14/13 1605  BP: 162/82  Pulse: 62  Temp: 97.6 F (36.4 C)  Resp: 20   Filed Vitals:   06/14/13 1605  Height: 5' 10.5" (1.791 m)  Weight: 237 lb (107.502 kg)   Body mass index is 33.51 kg/(m^2). Ideal Body Weight: Weight in (lb) to have BMI = 25: 176.4  GEN: WDWN, NAD, Non-toxic, A & O x 3, overweight, looks well HEENT: Atraumatic, Normocephalic. Neck supple. No masses, No LAD.  Bilateral TM wnl, oropharynx normal.  PEERL,EOMI.   Ears and Nose: No external deformity. CV: No M/G/R. No JVD. No thrill. No extra heart sounds.  Irregularly irregular rhythm PULM: CTA B, no crackles, rhonchi. No retractions. No resp. distress. No accessory muscle use. Diffuse wheeze bilaterally EXTR: No c/c/e NEURO Normal gait.  PSYCH: Normally interactive. Conversant. Not depressed or  anxious appearing.  Calm demeanor.   Treated with an albuterol neb treatment.  He noted significant improvement in his WOB, wheezing decreased. BP and O2 sats improved Performed EKG to ensure not in atrial fib- he is in SR with PVCs.    Assessment and Plan: Reactive airway  disease - Plan: albuterol (PROVENTIL) (2.5 MG/3ML) 0.083% nebulizer solution 2.5 mg, albuterol (PROVENTIL) (2.5 MG/3ML) 0.083% nebulizer solution, DISCONTINUED: albuterol (PROVENTIL) (2.5 MG/3ML) 0.083% nebulizer solution  Irregular cardiac rhythm - Plan: EKG 12-Lead  Treated for RAD exacerbation with an albuterol neb.  He notes that this seems to work a lot better than his albuterol HFA.  Prescribed him a home neb machine and neb ampules.  Also gave an rx for a spacer device which may help him better use his HFA.    He will continue to use albuterol as needed, and we will aware of the possibility of more frequent PVCs.  He will seek care if not better in the next couple of days- Sooner if worse.     Signed Lamar Blinks, MD

## 2013-06-17 ENCOUNTER — Other Ambulatory Visit: Payer: Self-pay | Admitting: Diagnostic Neuroimaging

## 2013-06-19 ENCOUNTER — Other Ambulatory Visit: Payer: Self-pay | Admitting: *Deleted

## 2013-06-19 DIAGNOSIS — R0602 Shortness of breath: Secondary | ICD-10-CM

## 2013-06-19 DIAGNOSIS — R7981 Abnormal blood-gas level: Secondary | ICD-10-CM

## 2013-06-21 ENCOUNTER — Encounter (HOSPITAL_COMMUNITY): Payer: Self-pay | Admitting: Emergency Medicine

## 2013-06-21 ENCOUNTER — Ambulatory Visit (INDEPENDENT_AMBULATORY_CARE_PROVIDER_SITE_OTHER): Payer: Medicare Other | Admitting: Internal Medicine

## 2013-06-21 ENCOUNTER — Ambulatory Visit: Payer: Medicare Other

## 2013-06-21 ENCOUNTER — Observation Stay (HOSPITAL_COMMUNITY)
Admission: EM | Admit: 2013-06-21 | Discharge: 2013-06-23 | Disposition: A | Discharge status: Home / Self Care | Payer: Medicare Other | Attending: Internal Medicine | Admitting: Internal Medicine

## 2013-06-21 VITALS — BP 162/70 | HR 81 | Temp 97.8°F | Resp 18 | Ht 70.5 in | Wt 236.4 lb

## 2013-06-21 DIAGNOSIS — J309 Allergic rhinitis, unspecified: Secondary | ICD-10-CM

## 2013-06-21 DIAGNOSIS — M7918 Myalgia, other site: Secondary | ICD-10-CM

## 2013-06-21 DIAGNOSIS — I498 Other specified cardiac arrhythmias: Secondary | ICD-10-CM | POA: Diagnosis not present

## 2013-06-21 DIAGNOSIS — R059 Cough, unspecified: Secondary | ICD-10-CM | POA: Diagnosis not present

## 2013-06-21 DIAGNOSIS — R058 Other specified cough: Secondary | ICD-10-CM

## 2013-06-21 DIAGNOSIS — F419 Anxiety disorder, unspecified: Secondary | ICD-10-CM

## 2013-06-21 DIAGNOSIS — R0609 Other forms of dyspnea: Secondary | ICD-10-CM | POA: Diagnosis not present

## 2013-06-21 DIAGNOSIS — J45909 Unspecified asthma, uncomplicated: Secondary | ICD-10-CM

## 2013-06-21 DIAGNOSIS — J45901 Unspecified asthma with (acute) exacerbation: Principal | ICD-10-CM

## 2013-06-21 DIAGNOSIS — I4949 Other premature depolarization: Secondary | ICD-10-CM

## 2013-06-21 DIAGNOSIS — R05 Cough: Secondary | ICD-10-CM

## 2013-06-21 DIAGNOSIS — R079 Chest pain, unspecified: Secondary | ICD-10-CM

## 2013-06-21 DIAGNOSIS — I10 Essential (primary) hypertension: Secondary | ICD-10-CM

## 2013-06-21 DIAGNOSIS — J329 Chronic sinusitis, unspecified: Secondary | ICD-10-CM | POA: Diagnosis not present

## 2013-06-21 DIAGNOSIS — J9801 Acute bronchospasm: Secondary | ICD-10-CM | POA: Diagnosis not present

## 2013-06-21 DIAGNOSIS — Z96619 Presence of unspecified artificial shoulder joint: Secondary | ICD-10-CM | POA: Insufficient documentation

## 2013-06-21 DIAGNOSIS — R0600 Dyspnea, unspecified: Secondary | ICD-10-CM

## 2013-06-21 DIAGNOSIS — E669 Obesity, unspecified: Secondary | ICD-10-CM | POA: Insufficient documentation

## 2013-06-21 DIAGNOSIS — R1310 Dysphagia, unspecified: Secondary | ICD-10-CM | POA: Diagnosis not present

## 2013-06-21 DIAGNOSIS — Z860100 Personal history of colon polyps, unspecified: Secondary | ICD-10-CM

## 2013-06-21 DIAGNOSIS — Z87891 Personal history of nicotine dependence: Secondary | ICD-10-CM | POA: Insufficient documentation

## 2013-06-21 DIAGNOSIS — R0902 Hypoxemia: Secondary | ICD-10-CM | POA: Diagnosis not present

## 2013-06-21 DIAGNOSIS — J302 Other seasonal allergic rhinitis: Secondary | ICD-10-CM

## 2013-06-21 DIAGNOSIS — R0689 Other abnormalities of breathing: Secondary | ICD-10-CM

## 2013-06-21 DIAGNOSIS — G8929 Other chronic pain: Secondary | ICD-10-CM

## 2013-06-21 DIAGNOSIS — I519 Heart disease, unspecified: Secondary | ICD-10-CM | POA: Diagnosis not present

## 2013-06-21 DIAGNOSIS — Z9189 Other specified personal risk factors, not elsewhere classified: Secondary | ICD-10-CM | POA: Insufficient documentation

## 2013-06-21 DIAGNOSIS — Z981 Arthrodesis status: Secondary | ICD-10-CM | POA: Insufficient documentation

## 2013-06-21 DIAGNOSIS — R55 Syncope and collapse: Secondary | ICD-10-CM

## 2013-06-21 DIAGNOSIS — R0602 Shortness of breath: Secondary | ICD-10-CM | POA: Diagnosis not present

## 2013-06-21 DIAGNOSIS — R0989 Other specified symptoms and signs involving the circulatory and respiratory systems: Secondary | ICD-10-CM

## 2013-06-21 DIAGNOSIS — G4733 Obstructive sleep apnea (adult) (pediatric): Secondary | ICD-10-CM

## 2013-06-21 DIAGNOSIS — I5189 Other ill-defined heart diseases: Secondary | ICD-10-CM

## 2013-06-21 DIAGNOSIS — F411 Generalized anxiety disorder: Secondary | ICD-10-CM | POA: Insufficient documentation

## 2013-06-21 DIAGNOSIS — I428 Other cardiomyopathies: Secondary | ICD-10-CM | POA: Diagnosis not present

## 2013-06-21 DIAGNOSIS — I499 Cardiac arrhythmia, unspecified: Secondary | ICD-10-CM

## 2013-06-21 DIAGNOSIS — F4310 Post-traumatic stress disorder, unspecified: Secondary | ICD-10-CM

## 2013-06-21 DIAGNOSIS — R911 Solitary pulmonary nodule: Secondary | ICD-10-CM | POA: Diagnosis not present

## 2013-06-21 DIAGNOSIS — R053 Chronic cough: Secondary | ICD-10-CM

## 2013-06-21 DIAGNOSIS — K219 Gastro-esophageal reflux disease without esophagitis: Secondary | ICD-10-CM

## 2013-06-21 DIAGNOSIS — R0789 Other chest pain: Secondary | ICD-10-CM | POA: Diagnosis present

## 2013-06-21 DIAGNOSIS — I7779 Dissection of other artery: Secondary | ICD-10-CM

## 2013-06-21 DIAGNOSIS — Z7982 Long term (current) use of aspirin: Secondary | ICD-10-CM | POA: Diagnosis not present

## 2013-06-21 DIAGNOSIS — I429 Cardiomyopathy, unspecified: Secondary | ICD-10-CM

## 2013-06-21 DIAGNOSIS — S0181XA Laceration without foreign body of other part of head, initial encounter: Secondary | ICD-10-CM

## 2013-06-21 LAB — POCT CBC
Granulocyte percent: 66 %G (ref 37–80)
HEMATOCRIT: 45.5 % (ref 43.5–53.7)
HEMOGLOBIN: 14.8 g/dL (ref 14.1–18.1)
Lymph, poc: 1.5 (ref 0.6–3.4)
MCH, POC: 31 pg (ref 27–31.2)
MCHC: 32.5 g/dL (ref 31.8–35.4)
MCV: 95.2 fL (ref 80–97)
MID (cbc): 0.4 (ref 0–0.9)
MPV: 10.5 fL (ref 0–99.8)
POC GRANULOCYTE: 3.7 (ref 2–6.9)
POC LYMPH PERCENT: 26.1 %L (ref 10–50)
POC MID %: 7.9 % (ref 0–12)
Platelet Count, POC: 267 10*3/uL (ref 142–424)
RBC: 4.78 M/uL (ref 4.69–6.13)
RDW, POC: 13.4 %
WBC: 5.6 10*3/uL (ref 4.6–10.2)

## 2013-06-21 LAB — D-DIMER, QUANTITATIVE: D-Dimer, Quant: 0.61 ug/mL-FEU — ABNORMAL HIGH (ref 0.00–0.48)

## 2013-06-21 LAB — CBC WITH DIFFERENTIAL/PLATELET
Basophils Absolute: 0 10*3/uL (ref 0.0–0.1)
Basophils Relative: 1 % (ref 0–1)
EOS ABS: 0.5 10*3/uL (ref 0.0–0.7)
Eosinophils Relative: 7 % — ABNORMAL HIGH (ref 0–5)
HEMATOCRIT: 42.5 % (ref 39.0–52.0)
HEMOGLOBIN: 14.8 g/dL (ref 13.0–17.0)
LYMPHS ABS: 1.4 10*3/uL (ref 0.7–4.0)
Lymphocytes Relative: 23 % (ref 12–46)
MCH: 31.4 pg (ref 26.0–34.0)
MCHC: 34.8 g/dL (ref 30.0–36.0)
MCV: 90.2 fL (ref 78.0–100.0)
MONOS PCT: 6 % (ref 3–12)
Monocytes Absolute: 0.4 10*3/uL (ref 0.1–1.0)
Neutro Abs: 3.8 10*3/uL (ref 1.7–7.7)
Neutrophils Relative %: 63 % (ref 43–77)
Platelets: 222 10*3/uL (ref 150–400)
RBC: 4.71 MIL/uL (ref 4.22–5.81)
RDW: 12.9 % (ref 11.5–15.5)
WBC: 6.1 10*3/uL (ref 4.0–10.5)

## 2013-06-21 LAB — COMPREHENSIVE METABOLIC PANEL
ALBUMIN: 4.4 g/dL (ref 3.5–5.2)
ALK PHOS: 72 U/L (ref 39–117)
ALT: 28 U/L (ref 0–53)
AST: 35 U/L (ref 0–37)
BILIRUBIN TOTAL: 2.2 mg/dL — AB (ref 0.3–1.2)
BUN: 13 mg/dL (ref 6–23)
CO2: 30 mEq/L (ref 19–32)
CREATININE: 0.95 mg/dL (ref 0.50–1.35)
Calcium: 9.5 mg/dL (ref 8.4–10.5)
Chloride: 96 mEq/L (ref 96–112)
GFR calc non Af Amer: 82 mL/min — ABNORMAL LOW (ref >90)
GLUCOSE: 109 mg/dL — AB (ref 70–99)
Potassium: 3.5 mEq/L — ABNORMAL LOW (ref 3.7–5.3)
Sodium: 139 mEq/L (ref 137–147)
TOTAL PROTEIN: 7.3 g/dL (ref 6.0–8.3)

## 2013-06-21 LAB — I-STAT TROPONIN, ED: Troponin i, poc: 0.01 ng/mL (ref 0.00–0.08)

## 2013-06-21 LAB — GLUCOSE, POCT (MANUAL RESULT ENTRY): POC Glucose: 102 mg/dl — AB (ref 70–99)

## 2013-06-21 LAB — BRAIN NATRIURETIC PEPTIDE: BRAIN NATRIURETIC PEPTIDE: 12 pg/mL (ref 0.0–100.0)

## 2013-06-21 MED ORDER — SODIUM CHLORIDE 0.9 % IJ SOLN
3.0000 mL | Freq: Two times a day (BID) | INTRAMUSCULAR | Status: DC
  Administered 2013-06-22: 3 mL via INTRAVENOUS

## 2013-06-21 MED ORDER — ALBUTEROL SULFATE (2.5 MG/3ML) 0.083% IN NEBU
5.0000 mg | INHALATION_SOLUTION | Freq: Once | RESPIRATORY_TRACT | Status: AC
Start: 2013-06-21 — End: 2013-06-21
  Administered 2013-06-21: 5 mg via RESPIRATORY_TRACT
  Filled 2013-06-21: qty 6

## 2013-06-21 MED ORDER — GUAIFENESIN-DM 100-10 MG/5ML PO SYRP: 5.0000 mL | ORAL_SOLUTION | ORAL | Status: DC | PRN

## 2013-06-21 MED ORDER — IPRATROPIUM BROMIDE 0.02 % IN SOLN
0.5000 mg | Freq: Once | RESPIRATORY_TRACT | Status: AC
  Administered 2013-06-21: 0.5 mg via RESPIRATORY_TRACT

## 2013-06-21 MED ORDER — PREDNISONE 20 MG PO TABS
20.0000 mg | ORAL_TABLET | Freq: Two times a day (BID) | ORAL | Status: DC
  Filled 2013-06-21 (×4): qty 1

## 2013-06-21 MED ORDER — ALBUTEROL SULFATE (2.5 MG/3ML) 0.083% IN NEBU
2.5000 mg | INHALATION_SOLUTION | Freq: Once | RESPIRATORY_TRACT | Status: AC
  Administered 2013-06-21: 2.5 mg via RESPIRATORY_TRACT

## 2013-06-21 MED ORDER — ALBUTEROL SULFATE (2.5 MG/3ML) 0.083% IN NEBU
2.5000 mg | INHALATION_SOLUTION | Freq: Four times a day (QID) | RESPIRATORY_TRACT | Status: DC | PRN
Start: 2013-06-21 — End: 2013-06-21

## 2013-06-21 MED ORDER — SODIUM CHLORIDE 0.9 % IJ SOLN
3.0000 mL | Freq: Two times a day (BID) | INTRAMUSCULAR | Status: DC
  Administered 2013-06-21: 3 mL via INTRAVENOUS

## 2013-06-21 MED ORDER — VITAMIN D3 25 MCG (1000 UNIT) PO TABS
2000.0000 [IU] | ORAL_TABLET | Freq: Every day | ORAL | Status: DC
  Administered 2013-06-22 – 2013-06-23 (×2): 2000 [IU] via ORAL
  Filled 2013-06-21 (×2): qty 2

## 2013-06-21 MED ORDER — IPRATROPIUM BROMIDE 0.02 % IN SOLN
0.5000 mg | Freq: Once | RESPIRATORY_TRACT | Status: AC
  Administered 2013-06-21: 0.5 mg via RESPIRATORY_TRACT
  Filled 2013-06-21: qty 2.5

## 2013-06-21 MED ORDER — VERAPAMIL HCL ER 120 MG PO TBCR
120.0000 mg | EXTENDED_RELEASE_TABLET | Freq: Every day | ORAL | Status: DC
  Administered 2013-06-22 – 2013-06-23 (×2): 120 mg via ORAL
  Filled 2013-06-21 (×2): qty 1

## 2013-06-21 MED ORDER — CHLORTHALIDONE 25 MG PO TABS
12.5000 mg | ORAL_TABLET | Freq: Every day | ORAL | Status: DC
  Administered 2013-06-22 – 2013-06-23 (×2): 12.5 mg via ORAL
  Filled 2013-06-21 (×2): qty 0.5

## 2013-06-21 MED ORDER — SODIUM CHLORIDE 0.9 % IV SOLN: 250.0000 mL | INTRAVENOUS | Status: DC | PRN

## 2013-06-21 MED ORDER — SODIUM CHLORIDE 0.9 % IJ SOLN: 3.0000 mL | INTRAMUSCULAR | Status: DC | PRN

## 2013-06-21 MED ORDER — METHYLPREDNISOLONE SODIUM SUCC 125 MG IJ SOLR
125.0000 mg | Freq: Once | INTRAMUSCULAR | Status: AC
  Administered 2013-06-21: 125 mg via INTRAVENOUS

## 2013-06-21 MED ORDER — ALBUTEROL SULFATE (2.5 MG/3ML) 0.083% IN NEBU: 2.5000 mg | INHALATION_SOLUTION | RESPIRATORY_TRACT | Status: DC | PRN

## 2013-06-21 MED ORDER — HYDROCODONE-ACETAMINOPHEN 7.5-325 MG/15ML PO SOLN: 5.0000 mL | Freq: Four times a day (QID) | ORAL | Status: DC | PRN

## 2013-06-21 NOTE — Patient Instructions (Signed)
Asthma, Acute Bronchospasm Acute bronchospasm caused by asthma is also referred to as an asthma attack. Bronchospasm means your air passages become narrowed. The narrowing is caused by inflammation and tightening of the muscles in the air tubes (bronchi) in your lungs. This can make it hard to breath or cause you to wheeze and cough. CAUSES Possible triggers are:  Animal dander from the skin, hair, or feathers of animals.  Dust mites contained in house dust.  Cockroaches.  Pollen from trees or grass.  Mold.  Cigarette or tobacco smoke.  Air pollutants such as dust, household cleaners, hair sprays, aerosol sprays, paint fumes, strong chemicals, or strong odors.  Cold air or weather changes. Cold air may trigger inflammation. Winds increase molds and pollens in the air.  Strong emotions such as crying or laughing hard.  Stress.  Certain medicines such as aspirin or beta-blockers.  Sulfites in foods and drinks, such as dried fruits and wine.  Infections or inflammatory conditions, such as a flu, cold, or inflammation of the nasal membranes (rhinitis).  Gastroesophageal reflux disease (GERD). GERD is a condition where stomach acid backs up into your throat (esophagus).  Exercise or strenuous activity. SIGNS AND SYMPTOMS   Wheezing.  Excessive coughing, particularly at night.  Chest tightness.  Shortness of breath. DIAGNOSIS  Your health care provider will ask you about your medical history and perform a physical exam. A chest X-ray or blood testing may be performed to look for other causes of your symptoms or other conditions that may have triggered your asthma attack. TREATMENT  Treatment is aimed at reducing inflammation and opening up the airways in your lungs. Most asthma attacks are treated with inhaled medicines. These include quick relief or rescue medicines (such as bronchodilators) and controller medicines (such as inhaled corticosteroids). These medicines are  sometimes given through an inhaler or a nebulizer. Systemic steroid medicine taken by mouth or given through an IV tube also can be used to reduce the inflammation when an attack is moderate or severe. Antibiotic medicines are only used if a bacterial infection is present.  HOME CARE INSTRUCTIONS   Rest.  Drink plenty of liquids. This helps the mucus to remain thin and be easily coughed up. Only use caffeine in moderation and do not use alcohol until you have recovered from your illness.  Do not smoke. Avoid being exposed to secondhand smoke.  You play a critical role in keeping yourself in good health. Avoid exposure to things that cause you to wheeze or to have breathing problems.  Keep your medicines up to date and available. Carefully follow your health care provider's treatment plan.  Take your medicine exactly as prescribed.  When pollen or pollution is bad, keep windows closed and use an air conditioner or go to places with air conditioning.  Asthma requires careful medical care. See your health care provider for a follow-up as advised. If you are more than [redacted] weeks pregnant and you were prescribed any new medicines, let your obstetrician know about the visit and how you are doing. Follow-up with your health care provider as directed.  After you have recovered from your asthma attack, make an appointment with your outpatient doctor to talk about ways to reduce the likelihood of future attacks. If you do not have a doctor who manages your asthma, make an appointment with a primary care doctor to discuss your asthma. SEEK IMMEDIATE MEDICAL CARE IF:   You are getting worse.  You have trouble breathing. If severe, call  your local emergency services (911 in the [place]  You develop chest pain or discomfort.  You are vomiting.  You are not able to keep fluids down.  You are coughing up yellow, green, brown, or bloody sputum.  You have a fever and your symptoms suddenly get  worse.  You have trouble swallowing. MAKE SURE YOU:   Understand these instructions.  Will watch your condition.  Will get help right away if you are not doing well or get worse. Document Released: 05/14/2006 Document Revised: 09/29/2012 Document Reviewed: 08/04/2012 [facility] Patient Information 2014 [place], [place]. Bronchospasm, Adult A bronchospasm is a spasm or tightening of the airways going into the lungs. During a bronchospasm breathing becomes more difficult because the airways get smaller. When this happens there can be coughing, a whistling sound when breathing (wheezing), and difficulty breathing. Bronchospasm is often associated with asthma, but not all patients who experience a bronchospasm have asthma. CAUSES  A bronchospasm is caused by inflammation or irritation of the airways. The inflammation or irritation may be triggered by:   Allergies (such as to animals, pollen, food, or mold). Allergens that cause bronchospasm may cause wheezing immediately after exposure or many hours later.   Infection. Viral infections are believed to be the most common cause of bronchospasm.   Exercise.   Irritants (such as pollution, cigarette smoke, strong odors, aerosol sprays, and paint fumes).   Weather changes. Winds increase molds and pollens in the air. Rain refreshes the air by washing irritants out. Cold air may cause inflammation.   Stress and emotional upset.  SIGNS AND SYMPTOMS   Wheezing.   Excessive nighttime coughing.   Frequent or severe coughing with a simple cold.   Chest tightness.   Shortness of breath.  DIAGNOSIS  Bronchospasm is usually diagnosed through a history and physical exam. Tests, such as chest X-rays, are sometimes done to look for other conditions. TREATMENT   Inhaled medicines can be given to open up your airways and help you breathe. The medicines can be given using either an inhaler or a nebulizer machine.  Corticosteroid  medicines may be given for severe bronchospasm, usually when it is associated with asthma. HOME CARE INSTRUCTIONS   Always have a plan prepared for seeking medical care. Know when to call your health care provider and local emergency services (911 in the [place] Know where you can access local emergency care.  Only take medicines as directed by your health care provider.  If you were prescribed an inhaler or nebulizer machine, ask your health care provider to explain how to use it correctly. Always use a spacer with your inhaler if you were given one.  It is necessary to remain calm during an attack. Try to relax and breathe more slowly.  Control your home environment in the following ways:   Change your heating and air conditioning filter at least once a month.   Limit your use of fireplaces and wood stoves.  Do not smoke and do not allow smoking in your home.   Avoid exposure to perfumes and fragrances.   Get rid of pests (such as roaches and mice) and their droppings.   Throw away plants if you see mold on them.   Keep your house clean and dust free.   Replace carpet with wood, tile, or vinyl flooring. Carpet can trap dander and dust.   Use allergy-proof pillows, mattress covers, and box spring covers.   Wash bed sheets and blankets every week in hot water and  dry them in a dryer.   Use blankets that are made of polyester or cotton.   Wash hands frequently. SEEK MEDICAL CARE IF:   You have muscle aches.   You have chest pain.   The sputum changes from clear or white to yellow, green, gray, or bloody.   The sputum you cough up gets thicker.   There are problems that may be related to the medicine you are given, such as a rash, itching, swelling, or trouble breathing.  SEEK IMMEDIATE MEDICAL CARE IF:   You have worsening wheezing and coughing even after taking your prescribed medicines.   You have increased difficulty breathing.   You develop  severe chest pain. MAKE SURE YOU:   Understand these instructions.  Will watch your condition.  Will get help right away if you are not doing well or get worse. Document Released: 01/30/2003 Document Revised: 09/29/2012 Document Reviewed: 07/19/2012 [facility] Patient Information 2014 [place]. Hypoxemia Hypoxemia occurs when your blood does not contain enough oxygen. The body cannot work well when it does not have enough oxygen, because every part of your body needs oxygen. Oxygen travels to all parts of the body through your blood. Hypoxemia can develop suddenly or can come on slowly. CAUSES Some common causes of hypoxemia include:  Long-term (chronic) lung diseases, such as chronic obstructive pulmonary disease (COPD) or interstitial lung disease.  Disorders that affect breathing at night, such as sleep apnea.  Fluid buildup in your lungs (pulmonary edema).  Lung infection (pneumonia).  Lung or throat cancer.  Abnormal blood flow that bypasses the lungs (shunt).  Certain diseasesthat affect nerves or muscles.  A collapsed lung (pneumothorax).  A blood clot in the lungs (pulmonary embolus).  Certain types of heart disease.  Slow or shallow breathing (hypoventilation).  Certain medicines.  High altitudes.  Toxic chemicals and gases. SIGNS AND SYMPTOMS Not everyone who has hypoxemia will develop symptoms. If the hypoxemia developed quickly, you will likely have symptoms such as shortness of breath. If the hypoxemia came on slowly over months or years, you may not notice any symptoms. Symptoms can include:  Shortness of breath (dyspnea).  Bluish color of the skin, lips, or nail beds.  Breathing that is fast, noisy, or shallow.  A fast heartbeat.  Feeling tired or sleepy.  Being confused or feeling anxious. DIAGNOSIS To determine if you have hypoxemia, your health care provider may perform:  A physical exam.  Blood tests.  A pulse oximetry. A  sensor will be put on your finger, toe, or earlobe to measure the percent of oxygen in your blood. TREATMENT You will likely be treated with oxygen therapy. Depending on the cause of your hypoxemia, you may need oxygen for a short time (weeks or months), or you may need it indefinitely. Your health care provider may also recommend other therapies to treat the underlying cause of your hypoxemia. HOME CARE INSTRUCTIONS  Only take over-the-counter or prescription medicines as directed by your health care provider.  Follow oxygen safety measures if you are on oxygen therapy. These may include:  Always having a backup supply of oxygen.  Not allowing anyone to smoke around oxygen.  Handling the oxygen tanks carefully and as instructed.  If you smoke, quit. Stay away from people who smoke.  Follow up with your health care provider as directed. SEEK MEDICAL CARE IF:  You have any concerns about your oxygen therapy.  You still have trouble breathing.  You become short of breath when you  exercise.  You are tired when you wake up.  You have a headache when you wake up. SEEK IMMEDIATE MEDICAL CARE IF:   Your breathing gets worse.  You have new shortness of breath with normal activity.  You have a bluish color of the skin, lips, or nail beds.  You have confusion or cloudy thinking.  You cough up dark mucus.  You have chest pain.  You have a fever. MAKE SURE YOU:  Understand these instructions.  Will watch your condition.  Will get help right away if you are not doing well or get worse. Document Released: 08/12/2010 Document Revised: 09/29/2012 Document Reviewed: 08/26/2012 [facility] Patient Information 2014 [place], [place].

## 2013-06-21 NOTE — ED Notes (Signed)
Admitting MD at bedside.

## 2013-06-21 NOTE — Progress Notes (Signed)
   Subjective:    Patient ID: [name] [name], male    DOB: 1940-07-10, 73 y.o.   MRN: [id number]  HPI 1-2 months of progressive, persistent sob and doe.Was previously stable with cardiomyopathy, htn, allergys with just occ bronchospasm. Now how has constant bronchospasm, cough and dyspnea/sob. His home oximeter is usually reading less than 90 and has been as low as 80 by his records.He is not able to sleep through the nite and wakes 4-5x because of cough,wheezing, sob. He has no pleuritic cp, no hemoptysis. He has had multiple courses of steroids. He was taken off advair and steroids so pulmonary doc could do proper evaluation but appt isnt until end of May. He is on waiting list there.   Review of Systems     Objective:   Physical Exam  Vitals reviewed. Constitutional: He is oriented to person, place, and time. He appears well-nourished. He appears distressed.  HENT:  Head: Normocephalic.  Right Ear: External ear normal.  Left Ear: External ear normal.  Mouth/Throat: Oropharynx is clear and moist.  Eyes: Conjunctivae and EOM are normal. Pupils are equal, round, and reactive to light.  Neck: Normal range of motion. Neck supple.  Cardiovascular: Normal rate.  A regularly irregular rhythm present. Frequent extrasystoles are present. Exam reveals distant heart sounds. Exam reveals no gallop.   No murmur heard. Pulmonary/Chest: Accessory muscle usage present. Tachypnea noted. He is in respiratory distress. He has decreased breath sounds. He has wheezes. He has rhonchi. He has no rales.  Abdominal: Soft.  Musculoskeletal: Normal range of motion.  Neurological: He is alert and oriented to person, place, and time. He exhibits normal muscle tone. Coordination normal.  Psychiatric: He has a normal mood and affect. His behavior is normal. Judgment and thought content normal.   Oximetry 87-91% walking slowly around office/pulse up to Audible bronchospasm without sthescope/Visibly strugglingly to  breathe  [facility] reading (PRIMARY) by  Dr Elder Cyphers no change, no acute pulmonary disease seen, no effusion  Results for orders placed in visit on 06/21/13  POCT CBC      Result Value Ref Range   WBC 5.6  4.6 - 10.2 K/uL   Lymph, poc 1.5  0.6 - 3.4   POC LYMPH PERCENT 26.1  10 - 50 %L   MID (cbc) 0.4  0 - 0.9   POC MID % 7.9  0 - 12 %M   POC Granulocyte 3.7  2 - 6.9   Granulocyte percent 66.0  37 - 80 %G   RBC 4.78  4.69 - 6.13 M/uL   Hemoglobin 14.8  14.1 - 18.1 g/dL   HCT, POC 45.5  43.5 - 53.7 %   MCV 95.2  80 - 97 fL   MCH, POC 31.0  27 - 31.2 pg   MCHC 32.5  31.8 - 35.4 g/dL   RDW, POC 13.4     Platelet Count, POC 267  142 - 424 K/uL   MPV 10.5  0 - 99.8 fL  GLUCOSE, POCT (MANUAL RESULT ENTRY)      Result Value Ref Range   POC Glucose 102 (*) 70 - 99 mg/dl   .nebulizer--helped a little  Nasal oxygen 3liters feels much better  Start IV KVO Solumedrol 125mg  iv monitor  Oximetry 89      Assessment & Plan:  Respiratory insufficiency/Hypoxemia/Fatigue Bronchospasm all fields Tachypnea/sob/doe Cardiomyopathy Admit to [facility]/EMTs to ER

## 2013-06-21 NOTE — ED Notes (Signed)
Attempted report 

## 2013-06-21 NOTE — ED Notes (Signed)
Pt states green, yellow sputum for the past week. Pt states that he has been SOB for 2 months now.

## 2013-06-21 NOTE — ED Notes (Addendum)
Per EMS: pt brought from [facility]. Pt brought for Asthma attack not getting better with 2 treatments of albuterol and 125mg  of Solumedrol. Pt still has wheezing throught out. Pt has IV access and complaining of pain in his chest as well. Pt has breathing treatment going and still continues to have wheezing.

## 2013-06-21 NOTE — H&P (Signed)
PCP:   GUEST, Veneda Melter, MD   Chief Complaint:  sob  HPI: 73 yo male h/o several months of wheezing, sob has been on several prednisone tapers and the exacerbations continue.  No fevers.  He is under the care of a pulmonologist and was taken off his advair several weeks ago in order to get PFT done on may 29.  He denies any le edema/swelling.  Nonsmoker.  No childhood h/o asthma.  Not formally dx with copd.  He comes in again today with wheezing despite using his home alb inhaler and nebulizer.  He just got a neb machine about 3 days ago.  He has been off predisone for couple of weeks.  He is much improved with iv solumedrol and albuterol neb in the ED.   Review of Systems:  Positive and negative as per HPI otherwise all other systems are negative  Past Medical History: Past Medical History  Diagnosis Date  . HTN (hypertension)   . PVC (premature ventricular contraction)   . History of agent Orange exposure   . Retinopathy     related to PVC's  . Dissection of carotid artery     history  . Dissection of renal artery     history  . GERD (gastroesophageal reflux disease)   . History of hepatitis 1969    [place], unknown what type  . Anal fissure   . Anxiety disorder   . Arthritis   . Asthma   . Adenomatous colon polyp   . Diverticulosis   . Gallstones   . Cardiomyopathy     EF of 40-45%   Past Surgical History  Procedure Laterality Date  . Appendectomy  1963  . Cervical fusion  1985  . Cholecystectomy  1990  . Tonsillectomy  1965  . Hernia repair    . Carotid disection    . Right renal artery disection    . Nose surgery    . Vasectomy    . Total shoulder replacement      Right    Medications: Prior to Admission medications   Medication Sig Start Date End Date Taking? Authorizing Provider  (No Medication Selected) Apply 1 application topically 4 (four) times daily as needed. Multiple meds in compound-- Applies to lower legs and feet   Yes Historical Provider,  MD  albuterol (PROAIR HFA) 108 (90 BASE) MCG/ACT inhaler Inhale 2 puffs into the lungs every 4 (four) hours as needed for wheezing. 10/04/12  Yes Orma Flaming, MD  albuterol (PROVENTIL) (2.5 MG/3ML) 0.083% nebulizer solution Take 3 mLs (2.5 mg total) by nebulization every 6 (six) hours as needed for wheezing or shortness of breath. 06/14/13  Yes Gay Filler Copland, MD  Ascorbic Acid (VITAMIN C) 1000 MG tablet Take 1,000 mg by mouth daily.     Yes Historical Provider, MD  aspirin 325 MG EC tablet Take 162.5 mg by mouth daily.    Yes Historical Provider, MD  B Complex Vitamins (VITAMIN B COMPLEX) TABS Take 1 tablet by mouth daily.   Yes Historical Provider, MD  chlorthalidone (HYGROTON) 25 MG tablet Take 12.5 mg by mouth daily.   Yes Historical Provider, MD  cholecalciferol (VITAMIN D) 1000 UNITS tablet Take 2,000 Units by mouth daily.    Yes Historical Provider, MD  fluticasone (FLONASE) 50 MCG/ACT nasal spray Place 2 sprays into the nose daily. 06/01/12  Yes Orma Flaming, MD  Gabapentin, PHN, (GRALISE) 600 MG TABS Take 1,800 mg by mouth every evening.   Yes  Historical Provider, MD  HYDROcodone-acetaminophen (HYCET) 7.5-325 mg/15 ml solution Take 5 mLs by mouth every 6 (six) hours as needed (for cough).    Yes Historical Provider, MD  lidocaine (LIDODERM) 5 % Place 0.5 patches onto the skin daily. Remove & Discard patch within 12 hours or as directed by MD   Yes Historical Provider, MD  Multiple Vitamins-Minerals (ONE-A-DAY 50 PLUS PO) Take 1 tablet by mouth daily.   Yes Historical Provider, MD  Multiple Vitamins-Minerals (PRESERVISION AREDS 2) CAPS Take 1 capsule by mouth 2 (two) times daily.   Yes Historical Provider, MD  verapamil (CALAN-SR) 120 MG CR tablet Take 120 mg by mouth daily.   Yes Historical Provider, MD    Allergies:   Allergies  Allergen Reactions  . Shrimp [Shellfish Allergy] Anaphylaxis  . Contrast Media [Iodinated Diagnostic Agents] Other (See Comments)    Skin reaction-not sure  exactly-per MD  . Morphine Hives  . Oxycodone-Acetaminophen Nausea And Vomiting  . Penicillins Swelling and Rash    Social History:  reports that he quit smoking about 35 years ago. His smoking use included Cigarettes. He has a 7.5 pack-year smoking history. He has never used smokeless tobacco. He reports that he drinks alcohol. He reports that he does not use illicit drugs.  Family History: Family History  Problem Relation Age of Onset  . Heart disease Father   . Colon cancer      uncle    Physical Exam: Filed Vitals:   06/21/13 1911 06/21/13 2015 06/21/13 2055  BP: 169/89 154/99 162/89  Pulse: 93 99 100  Resp: 22  20  Height: 5\' 10"  (1.778 m)    Weight: 104.327 kg (230 lb)    SpO2: 100% 92% 92%   General appearance: alert, cooperative and no distress Head: Normocephalic, without obvious abnormality, atraumatic Eyes: negative Nose: Nares normal. Septum midline. Mucosa normal. No drainage or sinus tenderness. Neck: no JVD and supple, symmetrical, trachea midline Lungs: clear to auscultation bilaterally Heart: regular rate and rhythm, S1, S2 normal, no murmur, click, rub or gallop Abdomen: soft, non-tender; bowel sounds normal; no masses,  no organomegaly Extremities: extremities normal, atraumatic, no cyanosis or edema Pulses: 2+ and symmetric Skin: Skin color, texture, turgor normal. No rashes or lesions Neurologic: Grossly normal    Labs on Admission:   Recent Labs  06/21/13 1922  NA 139  K 3.5*  CL 96  CO2 30  GLUCOSE 109*  BUN 13  CREATININE 0.95  CALCIUM 9.5    Recent Labs  06/21/13 1922  AST 35  ALT 28  ALKPHOS 72  BILITOT 2.2*  PROT 7.3  ALBUMIN 4.4    Recent Labs  06/21/13 1703 06/21/13 1922  WBC 5.6 6.1  NEUTROABS  --  3.8  HGB 14.8 14.8  HCT 45.5 42.5  MCV 95.2 90.2  PLT  --  222   Radiological Exams on Admission: Dg Chest 2 View  06/21/2013   CLINICAL DATA:  Cough, congestion, shortness of breath  EXAM: CHEST  2 VIEW   COMPARISON:  CT chest dated 05/26/2013  FINDINGS: Lungs are essentially clear. No focal consolidation. No pleural effusion or pneumothorax.  The heart is normal in size.  Mild degenerative changes of the visualized thoracolumbar spine.  Stable postsurgical changes involving the right shoulder.  IMPRESSION: No evidence of acute cardiopulmonary disease.   Electronically Signed   By: Julian Hy M.D.   On: 06/21/2013 19:41   Assessment/Plan  73 yo male with acute asthma exacerbation  Principal Problem:   Acute asthma exacerbation-  Place on prednisone, freq nebs.  Family requests to see him pulmonologist in am, obs overnight.  ???possibly try to move his pfts up??  They have tried this, and told could not by testing facility.  He is back at his baseline at this time.  Active Problems:  Stable unless o/w noted   OBSTRUCTIVE SLEEP APNEA   HYPERTENSION   Anxiety disorder   Left ventricular systolic dysfunction  obs on tele.  Full code.  Redell Nazir A Shanon Brow 06/21/2013, 9:00 PM

## 2013-06-22 ENCOUNTER — Observation Stay (HOSPITAL_COMMUNITY): Payer: Medicare Other

## 2013-06-22 DIAGNOSIS — R05 Cough: Secondary | ICD-10-CM

## 2013-06-22 DIAGNOSIS — J329 Chronic sinusitis, unspecified: Secondary | ICD-10-CM

## 2013-06-22 DIAGNOSIS — J45901 Unspecified asthma with (acute) exacerbation: Secondary | ICD-10-CM

## 2013-06-22 DIAGNOSIS — J309 Allergic rhinitis, unspecified: Secondary | ICD-10-CM | POA: Diagnosis not present

## 2013-06-22 DIAGNOSIS — R058 Other specified cough: Secondary | ICD-10-CM

## 2013-06-22 DIAGNOSIS — K219 Gastro-esophageal reflux disease without esophagitis: Secondary | ICD-10-CM

## 2013-06-22 DIAGNOSIS — R059 Cough, unspecified: Secondary | ICD-10-CM

## 2013-06-22 DIAGNOSIS — J302 Other seasonal allergic rhinitis: Secondary | ICD-10-CM

## 2013-06-22 DIAGNOSIS — G4733 Obstructive sleep apnea (adult) (pediatric): Secondary | ICD-10-CM

## 2013-06-22 DIAGNOSIS — R911 Solitary pulmonary nodule: Secondary | ICD-10-CM

## 2013-06-22 DIAGNOSIS — F4310 Post-traumatic stress disorder, unspecified: Secondary | ICD-10-CM

## 2013-06-22 DIAGNOSIS — R0602 Shortness of breath: Secondary | ICD-10-CM | POA: Diagnosis not present

## 2013-06-22 DIAGNOSIS — I519 Heart disease, unspecified: Secondary | ICD-10-CM | POA: Diagnosis not present

## 2013-06-22 MED ORDER — LORATADINE 10 MG PO TABS
10.0000 mg | ORAL_TABLET | Freq: Every day | ORAL | Status: DC
  Administered 2013-06-22 – 2013-06-23 (×2): 10 mg via ORAL
  Filled 2013-06-22 (×2): qty 1

## 2013-06-22 MED ORDER — TECHNETIUM TC 99M DIETHYLENETRIAME-PENTAACETIC ACID: 40.0000 | Freq: Once | INTRAVENOUS | Status: AC | PRN

## 2013-06-22 MED ORDER — SALINE SPRAY 0.65 % NA SOLN
1.0000 | NASAL | Status: DC | PRN
Start: 2013-06-22 — End: 2013-06-23
  Filled 2013-06-22: qty 44

## 2013-06-22 MED ORDER — GABAPENTIN (ONCE-DAILY) 600 MG PO TABS
1800.0000 mg | ORAL_TABLET | Freq: Every day | ORAL | Status: DC
  Administered 2013-06-22: 1800 mg via ORAL
  Filled 2013-06-22: qty 1

## 2013-06-22 MED ORDER — PREDNISONE 20 MG PO TABS
40.0000 mg | ORAL_TABLET | Freq: Every day | ORAL | Status: DC
  Administered 2013-06-23: 40 mg via ORAL
  Filled 2013-06-22 (×2): qty 2

## 2013-06-22 MED ORDER — ALPRAZOLAM 0.5 MG PO TABS: 0.5000 mg | ORAL_TABLET | Freq: Three times a day (TID) | ORAL | Status: DC | PRN

## 2013-06-22 MED ORDER — PREDNISONE 20 MG PO TABS
40.0000 mg | ORAL_TABLET | Freq: Two times a day (BID) | ORAL | Status: DC
  Filled 2013-06-22 (×2): qty 2

## 2013-06-22 MED ORDER — ALPRAZOLAM 0.25 MG PO TABS: 0.2500 mg | ORAL_TABLET | Freq: Three times a day (TID) | ORAL | Status: DC | PRN

## 2013-06-22 MED ORDER — FLUTICASONE PROPIONATE 50 MCG/ACT NA SUSP
2.0000 | Freq: Every day | NASAL | Status: DC
  Administered 2013-06-22 – 2013-06-23 (×2): 2 via NASAL
  Filled 2013-06-22: qty 16

## 2013-06-22 MED ORDER — PANTOPRAZOLE SODIUM 40 MG PO TBEC
40.0000 mg | DELAYED_RELEASE_TABLET | Freq: Two times a day (BID) | ORAL | Status: DC
  Administered 2013-06-22 – 2013-06-23 (×2): 40 mg via ORAL
  Filled 2013-06-22 (×2): qty 1

## 2013-06-22 MED ORDER — TECHNETIUM TO 99M ALBUMIN AGGREGATED
6.0000 | Freq: Once | INTRAVENOUS | Status: AC | PRN
  Administered 2013-06-22: 6 via INTRAVENOUS

## 2013-06-22 MED ORDER — TECHNETIUM TC 99M MEBROFENIN IV KIT: 5.0000 | PACK | Freq: Once | INTRAVENOUS | Status: AC | PRN

## 2013-06-22 MED ORDER — ALBUTEROL SULFATE (2.5 MG/3ML) 0.083% IN NEBU
2.5000 mg | INHALATION_SOLUTION | RESPIRATORY_TRACT | Status: DC | PRN
Start: 2013-06-22 — End: 2013-06-23

## 2013-06-22 MED ORDER — ALBUTEROL SULFATE (2.5 MG/3ML) 0.083% IN NEBU
2.5000 mg | INHALATION_SOLUTION | RESPIRATORY_TRACT | Status: DC
  Administered 2013-06-22 – 2013-06-23 (×5): 2.5 mg via RESPIRATORY_TRACT
  Filled 2013-06-22 (×4): qty 3

## 2013-06-22 MED ORDER — GABAPENTIN (ONCE-DAILY) 600 MG PO TABS: 1800.0000 mg | ORAL_TABLET | Freq: Every day | ORAL | Status: DC

## 2013-06-22 NOTE — Progress Notes (Signed)
Peak flows Pre 300 Post 280 Best effort was the pre tx effort of 300. Other efforts not as successful and technique not as good. Pt tolerated well.

## 2013-06-22 NOTE — ED Provider Notes (Signed)
CSN: [id number]     Arrival date & time 06/21/13  1903 History   First MD Initiated Contact with Patient 06/21/13 2007     Chief Complaint  Patient presents with  . Shortness of Breath  . Asthma     (Consider location/radiation/quality/duration/timing/severity/associated sxs/prior Treatment) Patient is a 73 y.o. male presenting with shortness of breath and asthma. The history is provided by the patient.  Shortness of Breath Associated symptoms: no abdominal pain, no chest pain, no headaches, no rash and no vomiting   Asthma Associated symptoms include shortness of breath. Pertinent negatives include no chest pain, no abdominal pain and no headaches.   patient presents for shortness of breath over the last week. He has had some shortness of breath before that but worse for last week. Mildly increased wheezing. His been seen at his primary care doctors and found her sats in the 56s. He had diffuse wheezes. He was given steroids and breathing treatments and to the ER. His been seen by pulmonary previously and taken off some of his medications for pulmonary function testing. No chest pain. No swelling in his legs.  Past Medical History  Diagnosis Date  . HTN (hypertension)   . PVC (premature ventricular contraction)   . History of agent Orange exposure   . Retinopathy     related to PVC's  . Dissection of carotid artery     history  . Dissection of renal artery     history  . GERD (gastroesophageal reflux disease)   . History of hepatitis 1969    [place], unknown what type  . Anal fissure   . Anxiety disorder   . Arthritis   . Asthma   . Adenomatous colon polyp   . Diverticulosis   . Gallstones   . Cardiomyopathy     EF of 40-45%   Past Surgical History  Procedure Laterality Date  . Appendectomy  1963  . Cervical fusion  1985  . Cholecystectomy  1990  . Tonsillectomy  1965  . Hernia repair    . Carotid disection    . Right renal artery disection    . Nose surgery    .  Vasectomy    . Total shoulder replacement      Right   Family History  Problem Relation Age of Onset  . Heart disease Father   . Colon cancer      uncle   History  Substance Use Topics  . Smoking status: Former Smoker -- 0.50 packs/day for 15 years    Types: Cigarettes    Quit date: 02/10/1978  . Smokeless tobacco: Never Used     Comment: QUIT late 70s/early 49s  . Alcohol Use: Yes     Comment: 0-3 daily    Review of Systems  Constitutional: Negative for activity change and appetite change.  Eyes: Negative for pain.  Respiratory: Positive for shortness of breath. Negative for chest tightness.   Cardiovascular: Negative for chest pain and leg swelling.  Gastrointestinal: Negative for nausea, vomiting, abdominal pain and diarrhea.  Genitourinary: Negative for flank pain.  Musculoskeletal: Negative for back pain and neck stiffness.  Skin: Negative for rash.  Neurological: Negative for weakness, numbness and headaches.  Psychiatric/Behavioral: Negative for behavioral problems.      Allergies  Shrimp; Contrast media; Morphine; Oxycodone-acetaminophen; and Penicillins  Home Medications   Prior to Admission medications   Medication Sig Start Date End Date Taking? Authorizing Provider  (No Medication Selected) Apply 1 application topically 4 (four)  times daily as needed. Multiple meds in compound-- Applies to lower legs and feet   Yes Historical Provider, MD  albuterol (PROAIR HFA) 108 (90 BASE) MCG/ACT inhaler Inhale 2 puffs into the lungs every 4 (four) hours as needed for wheezing. 10/04/12  Yes Orma Flaming, MD  albuterol (PROVENTIL) (2.5 MG/3ML) 0.083% nebulizer solution Take 3 mLs (2.5 mg total) by nebulization every 6 (six) hours as needed for wheezing or shortness of breath. 06/14/13  Yes Gay Filler Copland, MD  Ascorbic Acid (VITAMIN C) 1000 MG tablet Take 1,000 mg by mouth daily.     Yes Historical Provider, MD  aspirin 325 MG EC tablet Take 162.5 mg by mouth daily.     Yes Historical Provider, MD  B Complex Vitamins (VITAMIN B COMPLEX) TABS Take 1 tablet by mouth daily.   Yes Historical Provider, MD  chlorthalidone (HYGROTON) 25 MG tablet Take 12.5 mg by mouth daily.   Yes Historical Provider, MD  cholecalciferol (VITAMIN D) 1000 UNITS tablet Take 2,000 Units by mouth daily.    Yes Historical Provider, MD  fluticasone (FLONASE) 50 MCG/ACT nasal spray Place 2 sprays into the nose daily. 06/01/12  Yes Orma Flaming, MD  Gabapentin, PHN, (GRALISE) 600 MG TABS Take 1,800 mg by mouth every evening.   Yes Historical Provider, MD  HYDROcodone-acetaminophen (HYCET) 7.5-325 mg/15 ml solution Take 5 mLs by mouth every 6 (six) hours as needed (for cough).    Yes Historical Provider, MD  lidocaine (LIDODERM) 5 % Place 0.5 patches onto the skin daily. Remove & Discard patch within 12 hours or as directed by MD   Yes Historical Provider, MD  Multiple Vitamins-Minerals (ONE-A-DAY 50 PLUS PO) Take 1 tablet by mouth daily.   Yes Historical Provider, MD  Multiple Vitamins-Minerals (PRESERVISION AREDS 2) CAPS Take 1 capsule by mouth 2 (two) times daily.   Yes Historical Provider, MD  verapamil (CALAN-SR) 120 MG CR tablet Take 120 mg by mouth daily.   Yes Historical Provider, MD   BP 160/87  Pulse 85  Temp(Src) 97.5 F (36.4 C) (Oral)  Resp 16  Ht 5' 10.5" (1.791 m)  Wt 234 lb (106.142 kg)  BMI 33.09 kg/m2  SpO2 91% Physical Exam  Nursing note and vitals reviewed. Constitutional: He is oriented to person, place, and time. He appears well-developed and well-nourished.  HENT:  Head: Normocephalic and atraumatic.  Eyes: EOM are normal. Pupils are equal, round, and reactive to light.  Neck: Normal range of motion. Neck supple.  Cardiovascular: Normal rate, regular rhythm and normal heart sounds.   No murmur heard. Pulmonary/Chest: Effort normal. He has wheezes.   Diffuse wheezes and prolonged expirations.  Abdominal: Soft. Bowel sounds are normal. He exhibits no distension  and no mass. There is no tenderness. There is no rebound and no guarding.  Musculoskeletal: Normal range of motion. He exhibits edema.  Mild bilateral lower extremity pitting edema  Neurological: He is alert and oriented to person, place, and time. No cranial nerve deficit.  Skin: Skin is warm and dry.  Psychiatric: He has a normal mood and affect.    ED Course  Procedures (including critical care time) Labs Review Labs Reviewed  CBC WITH DIFFERENTIAL - Abnormal; Notable for the following:    Eosinophils Relative 7 (*)    All other components within normal limits  COMPREHENSIVE METABOLIC PANEL - Abnormal; Notable for the following:    Potassium 3.5 (*)    Glucose, Bld 109 (*)    Total Bilirubin  2.2 (*)    GFR calc non Af Amer 82 (*)    All other components within normal limits  I-STAT TROPOININ, ED    Imaging Review Dg Chest 2 View  06/21/2013   CLINICAL DATA:  Cough, congestion, shortness of breath  EXAM: CHEST  2 VIEW  COMPARISON:  CT chest dated 05/26/2013  FINDINGS: Lungs are essentially clear. No focal consolidation. No pleural effusion or pneumothorax.  The heart is normal in size.  Mild degenerative changes of the visualized thoracolumbar spine.  Stable postsurgical changes involving the right shoulder.  IMPRESSION: No evidence of acute cardiopulmonary disease.   Electronically Signed   By: Julian Hy M.D.   On: 06/21/2013 19:41     EKG Interpretation   Date/Time:  Tuesday Jun 21 2013 19:08:02 EDT Ventricular Rate:  96 PR Interval:  182 QRS Duration: 96 QT Interval:  416 QTC Calculation: 525 R Axis:   40 Text Interpretation:  Marked sinus bradycardia with frequent and  consecutive Premature ventricular complexes and Possible Premature atrial  complexes with Abberant conduction ST \\T \ T wave abnormality, consider  inferolateral ischemia Prolonged QT Abnormal ECG in a pattern of bigeminy  Confirmed by Alvino Chapel  MD, Ovid Curd [phone]) on 06/21/2013 8:34:31 PM       MDM   Final diagnoses:  Acute asthma exacerbation  Bigeminal rhythm   Patient with shortness of breath. With talking his sats will drop into the 80s. Diffuse wheezes. X-ray done at primary care does not show pneumonia. Continued shortness of breath and will be admitted to internal medicine. Also has ventricular bigeminy on EKG.    Jasper Riling. Alvino Chapel, MD 06/22/13 9323

## 2013-06-22 NOTE — Progress Notes (Signed)
Pt transferred to room 2W01; report given to RN.  Carollee Sires, RN

## 2013-06-22 NOTE — Progress Notes (Signed)
PROGRESS NOTE  [name] [name] LZJ:[id number] DOB: Feb 27, 1940 DOA: 06/21/2013 PCP: Kennon Portela, MD  Assessment/Plan: Acute asthma exacerbation?- was awaiting formal PFTs as an outpatient- Place on prednisone, nebs. Family requests to see him pulmonologist in am  -elevated d dimer- ?v/q scan as picture not clear  OBSTRUCTIVE SLEEP APNEA  obesity HYPERTENSION  Anxiety disorder - says PCP gives him xanax when he is on steroids Left ventricular systolic dysfunction- BNP low; last echo in 2012 showed: estimated ejection fraction was in the range of 40% to 45%. Diffusehypokinesis. Doppler parameters are consistent with abnormal left ventricular relaxation (grade 1 diastolicdysfunction).     Code Status: full Family Communication: wife and patient in detail Disposition Plan:    Consultants:  pulm  Procedures:  none  Antibiotics:  none  HPI/Subjective: Asking to be seen by pulm- wants a diagnosis and PFTs done sooner Feeling better once given steroids  Objective: Filed Vitals:   06/22/13 0500  BP: 139/83  Pulse: 66  Temp: 97.5 F (36.4 C)  Resp: 14   No intake or output data in the 24 hours ending 06/22/13 1011 Filed Weights   06/21/13 1911 06/21/13 2215  Weight: 104.327 kg (230 lb) 106.142 kg (234 lb)    Exam:   General:  A+Ox3, NAD  Cardiovascular: tachy  Respiratory: wheezing and coarse breath sounds  Abdomen: obese  Musculoskeletal: moves all 4 ext, no edema   Data Reviewed: Basic Metabolic Panel:  Recent Labs Lab 06/21/13 1922  NA 139  K 3.5*  CL 96  CO2 30  GLUCOSE 109*  BUN 13  CREATININE 0.95  CALCIUM 9.5   Liver Function Tests:  Recent Labs Lab 06/21/13 1922  AST 35  ALT 28  ALKPHOS 72  BILITOT 2.2*  PROT 7.3  ALBUMIN 4.4   No results found for this basename: LIPASE, AMYLASE,  in the last 168 hours No results found for this basename: AMMONIA,  in the last 168 hours CBC:  Recent Labs Lab 06/21/13 1703  06/21/13 1922  WBC 5.6 6.1  NEUTROABS  --  3.8  HGB 14.8 14.8  HCT 45.5 42.5  MCV 95.2 90.2  PLT  --  222   Cardiac Enzymes: No results found for this basename: CKTOTAL, CKMB, CKMBINDEX, TROPONINI,  in the last 168 hours BNP (last 3 results) No results found for this basename: PROBNP,  in the last 8760 hours CBG: No results found for this basename: GLUCAP,  in the last 168 hours  No results found for this or any previous visit (from the past 240 hour(s)).   Studies: Dg Chest 2 View  06/21/2013   CLINICAL DATA:  Cough, congestion, shortness of breath  EXAM: CHEST  2 VIEW  COMPARISON:  CT chest dated 05/26/2013  FINDINGS: Lungs are essentially clear. No focal consolidation. No pleural effusion or pneumothorax.  The heart is normal in size.  Mild degenerative changes of the visualized thoracolumbar spine.  Stable postsurgical changes involving the right shoulder.  IMPRESSION: No evidence of acute cardiopulmonary disease.   Electronically Signed   By: Julian Hy M.D.   On: 06/21/2013 19:41    Scheduled Meds: . chlorthalidone  12.5 mg Oral Daily  . cholecalciferol  2,000 Units Oral Daily  . predniSONE  20 mg Oral BID WC  . sodium chloride  3 mL Intravenous Q12H  . sodium chloride  3 mL Intravenous Q12H  . verapamil  120 mg Oral Daily   Continuous Infusions:  Antibiotics Given (last 72 hours)  None      Principal Problem:   Acute asthma exacerbation Active Problems:   OBSTRUCTIVE SLEEP APNEA   HYPERTENSION   Anxiety disorder   Left ventricular systolic dysfunction   Asthma   Asthma attack    Time spent: 35 min    [facility] Pager [phone]. If 7PM-7AM, please contact night-coverage at [email or web address], password [facility] 06/22/2013, 10:11 AM  LOS: 1 day

## 2013-06-22 NOTE — Progress Notes (Signed)
Utilization Review Completed.Neoma Laming T Dowell5/13/2015

## 2013-06-22 NOTE — Progress Notes (Addendum)
RN placed pt on 2L N/C. Pt took off N/C complaining it was causing him to sneeze more and cough up mucus. Pt desated to 83 during sleep after he took N/C off. RN placed oxygen back on pt and saturation level came up to 96. RN educated pt on the importance of keeping his oxygen on. Pt went to back to sleep  and continued to desat on 2L. RN bumped oxygen up to 4 pt saturation level still remains between 89-92 during sleep on 4L. RN paged MD. MD instructed RN to place pt on a venti mask starting off at 35%, if this is unsuccessful then to bump pt up to 50% on the Venti mask. Will continue to monitor.   Coolidge Breeze

## 2013-06-22 NOTE — Progress Notes (Signed)
Pt continues to take off Venti mask and desat. RN educated pt on the importance of keeping oxygen on. Pt verbalized understanding. Will continue to monitor.   Coolidge Breeze

## 2013-06-22 NOTE — Consult Note (Signed)
PULMONARY / CRITICAL CARE MEDICINE   Name: [name] [name] MRN: [id number] DOB: 12-Nov-1940    ADMISSION DATE:  06/21/2013 CONSULTATION DATE: 06/22/2013  REFERRING MD :  Eliseo Squires PRIMARY SERVICE: TRH  CHIEF COMPLAINT:  Dyspnea   BRIEF PATIENT DESCRIPTION:  73 yo remote smoker whose CT scan one year ago demonstrated R lung nodule. He has progressive respiratory symptoms since about 6 month ago associated with seasonal allergies. Followed by Dr.Clance for lung nodule and OSA (intolerable to CPAP). Has had multiple rounds of abx/steroids over last 4 months with some temporary improvement. Recently taken off Advair and was scheduled to have outpatient  PFT but developed acute resp distress 5/11 and was admitted. "I couldn't breathe."  SIGNIFICANT EVENTS / STUDIES:   LINES / TUBES:  CULTURES:  ANTIBIOTICS:  HISTORY OF PRESENT ILLNESS:   [name] [name] is a 73 yo remote smoker, retired Scientist, water quality, who needs an replacement right shoulder prosthesis. He had CT scan one year ago that revealed a rt lung nodule which postponed his shoulder repair x 1 year. He has had increased resp  Issues for last 6 months that worsened with time and increased seasonal allergies. Followed by DR Gwenette Greet for lung nodule and OSA(unable to tolerated Cpap) and has had multiple rounds of abx/steroids over last 4 months with some temporary improvement. Recently taken off Advair and was to have Opt PFT's but developed acute resp distress 5/11 and was admitted. "I couldn't breathe." He has improved with current treatments. He does have a component of PTSD that prevents him from tolerating full face mask for cpap. He denies fever , chills ,sweats, chest pains, + for copious clear secretions. Denies reflux symptoms. ++ seasonal allergies that have been worse this year.Recent sinus CT positive.  + chronic cough x 5 months. PCCM asked to evaluate.  PAST MEDICAL HISTORY :  Past Medical History  Diagnosis Date  . HTN (hypertension)    . PVC (premature ventricular contraction)   . History of agent Orange exposure   . Retinopathy     related to PVC's  . Dissection of carotid artery     history  . Dissection of renal artery     history  . GERD (gastroesophageal reflux disease)   . History of hepatitis 1969    [place], unknown what type  . Anal fissure   . Anxiety disorder   . Arthritis   . Asthma   . Adenomatous colon polyp   . Diverticulosis   . Gallstones   . Cardiomyopathy     EF of 40-45%   Past Surgical History  Procedure Laterality Date  . Appendectomy  1963  . Cervical fusion  1985  . Cholecystectomy  1990  . Tonsillectomy  1965  . Hernia repair    . Carotid disection    . Right renal artery disection    . Nose surgery    . Vasectomy    . Total shoulder replacement      Right   Prior to Admission medications   Medication Sig Start Date End Date Taking? Authorizing Provider  (No Medication Selected) Apply 1 application topically 4 (four) times daily as needed. Multiple meds in compound-- Applies to lower legs and feet   Yes Historical Provider, MD  albuterol (PROAIR HFA) 108 (90 BASE) MCG/ACT inhaler Inhale 2 puffs into the lungs every 4 (four) hours as needed for wheezing. 10/04/12  Yes Orma Flaming, MD  albuterol (PROVENTIL) (2.5 MG/3ML) 0.083% nebulizer solution Take 3 mLs (2.5  mg total) by nebulization every 6 (six) hours as needed for wheezing or shortness of breath. 06/14/13  Yes Gay Filler Copland, MD  Ascorbic Acid (VITAMIN C) 1000 MG tablet Take 1,000 mg by mouth daily.     Yes Historical Provider, MD  aspirin 325 MG EC tablet Take 162.5 mg by mouth daily.    Yes Historical Provider, MD  B Complex Vitamins (VITAMIN B COMPLEX) TABS Take 1 tablet by mouth daily.   Yes Historical Provider, MD  chlorthalidone (HYGROTON) 25 MG tablet Take 12.5 mg by mouth daily.   Yes Historical Provider, MD  cholecalciferol (VITAMIN D) 1000 UNITS tablet Take 2,000 Units by mouth daily.    Yes Historical Provider,  MD  fluticasone (FLONASE) 50 MCG/ACT nasal spray Place 2 sprays into the nose daily. 06/01/12  Yes Orma Flaming, MD  Gabapentin, PHN, (GRALISE) 600 MG TABS Take 1,800 mg by mouth every evening.   Yes Historical Provider, MD  HYDROcodone-acetaminophen (HYCET) 7.5-325 mg/15 ml solution Take 5 mLs by mouth every 6 (six) hours as needed (for cough).    Yes Historical Provider, MD  lidocaine (LIDODERM) 5 % Place 0.5 patches onto the skin daily. Remove & Discard patch within 12 hours or as directed by MD   Yes Historical Provider, MD  Multiple Vitamins-Minerals (ONE-A-DAY 50 PLUS PO) Take 1 tablet by mouth daily.   Yes Historical Provider, MD  Multiple Vitamins-Minerals (PRESERVISION AREDS 2) CAPS Take 1 capsule by mouth 2 (two) times daily.   Yes Historical Provider, MD  verapamil (CALAN-SR) 120 MG CR tablet Take 120 mg by mouth daily.   Yes Historical Provider, MD   Allergies  Allergen Reactions  . Shrimp [Shellfish Allergy] Anaphylaxis  . Contrast Media [Iodinated Diagnostic Agents] Other (See Comments)    Skin reaction-not sure exactly-per MD  . Morphine Hives  . Oxycodone-Acetaminophen Nausea And Vomiting  . Penicillins Swelling and Rash    FAMILY HISTORY:  Family History  Problem Relation Age of Onset  . Heart disease Father   . Colon cancer      uncle   SOCIAL HISTORY:  reports that he quit smoking about 35 years ago. His smoking use included Cigarettes. He has a 7.5 pack-year smoking history. He has never used smokeless tobacco. He reports that he drinks alcohol. He reports that he does not use illicit drugs.  REVIEW OF SYSTEMS:  10 point review of system taken, please see HPI for positives and negatives.  INTERVAL HISTORY:   VITAL SIGNS: Temp:  [97.5 F (36.4 C)-97.8 F (36.6 C)] 97.5 F (36.4 C) (05/13 0500) Pulse Rate:  [66-107] 66 (05/13 0500) Resp:  [13-22] 14 (05/13 0500) BP: (139-169)/(70-103) 139/83 mmHg (05/13 0500) SpO2:  [90 %-100 %] 90 % (05/13 0500) Weight:   [104.327 kg (230 lb)-107.23 kg (236 lb 6.4 oz)] 106.142 kg (234 lb) (05/12 2215)  HEMODYNAMICS:   VENTILATOR SETTINGS:   INTAKE / OUTPUT: Intake/Output     05/12 0701 - 05/13 0700 05/13 0701 - 05/14 0700        Urine Occurrence 2 x      PHYSICAL EXAMINATION: General: WNWDWM NAD @rest  Neuro: Intact, decreased moment rt shoulder from fx. HEENT:  No JVD/LAN/Good dentation Cardiovascular:  HSR RRR Lungs: Mild wheezes thru out ++ VCD Abdomen:  Old surgical scar ruq Musculoskeletal:  Rt shoulder with chronic fx Skin:  cool  LABS:  CBC  Recent Labs Lab 06/21/13 1703 06/21/13 1922  WBC 5.6 6.1  HGB 14.8 14.8  HCT  45.5 42.5  PLT  --  222   Coag's No results found for this basename: APTT, INR,  in the last 168 hours BMET  Recent Labs Lab 06/21/13 1922  NA 139  K 3.5*  CL 96  CO2 30  BUN 13  CREATININE 0.95  GLUCOSE 109*   Electrolytes  Recent Labs Lab 06/21/13 1922  CALCIUM 9.5   Sepsis Markers No results found for this basename: LATICACIDVEN, PROCALCITON, O2SATVEN,  in the last 168 hours ABG No results found for this basename: PHART, PCO2ART, PO2ART,  in the last 168 hours Liver Enzymes  Recent Labs Lab 06/21/13 1922  AST 35  ALT 28  ALKPHOS 72  BILITOT 2.2*  ALBUMIN 4.4   Cardiac Enzymes No results found for this basename: TROPONINI, PROBNP,  in the last 168 hours  Glucose No results found for this basename: GLUCAP,  in the last 168 hours  IMAGING:   Dg Chest 2 View  06/21/2013   CLINICAL DATA:  Cough, congestion, shortness of breath  EXAM: CHEST  2 VIEW  COMPARISON:  CT chest dated 05/26/2013  FINDINGS: Lungs are essentially clear. No focal consolidation. No pleural effusion or pneumothorax.  The heart is normal in size.  Mild degenerative changes of the visualized thoracolumbar spine.  Stable postsurgical changes involving the right shoulder.  IMPRESSION: No evidence of acute cardiopulmonary disease.   Electronically Signed   By: Julian Hy M.D.   On: 06/21/2013 19:41    ASSESSMENT AND PLAN:  Acute asthma exacerbation Seasonal allergies Allergic rhynitis Chronic sinusitis Upper airway cough syndrome Pulmonary nodule OSA Possible GERD   Goal SpO2>92   Supplemental oxygen  Change Albuterol to q4h ATC + q2h PRN  Decrease Prednisone to 40 daily   Add Claritin  Peak flows before / after bronchodilator.  Flonase / nasal saline  PFT is of little value in acute setting  Follow up with Dr. Gwenette Greet  Add Protonix 40 bid  Will follow   Richardson Landry Minor ACNP Maryanna Shape PCCM Pager [phone] till 3 pm If no answer page [phone] 06/22/2013, 10:38 AM  I have personally obtained history, examined patient, evaluated and interpreted laboratory and imaging results, reviewed medical records, formulated assessment / plan and placed orders.  Doree Fudge, MD Pulmonary and [place] Pager: [phone]  06/22/2013, 2:07 PM

## 2013-06-23 ENCOUNTER — Observation Stay (HOSPITAL_COMMUNITY): Payer: Medicare Other

## 2013-06-23 DIAGNOSIS — I10 Essential (primary) hypertension: Secondary | ICD-10-CM

## 2013-06-23 DIAGNOSIS — J45901 Unspecified asthma with (acute) exacerbation: Secondary | ICD-10-CM | POA: Diagnosis not present

## 2013-06-23 DIAGNOSIS — J329 Chronic sinusitis, unspecified: Secondary | ICD-10-CM | POA: Diagnosis not present

## 2013-06-23 DIAGNOSIS — G4733 Obstructive sleep apnea (adult) (pediatric): Secondary | ICD-10-CM | POA: Diagnosis not present

## 2013-06-23 DIAGNOSIS — K219 Gastro-esophageal reflux disease without esophagitis: Secondary | ICD-10-CM | POA: Diagnosis not present

## 2013-06-23 DIAGNOSIS — R1310 Dysphagia, unspecified: Secondary | ICD-10-CM | POA: Diagnosis not present

## 2013-06-23 DIAGNOSIS — J309 Allergic rhinitis, unspecified: Secondary | ICD-10-CM | POA: Diagnosis not present

## 2013-06-23 DIAGNOSIS — R079 Chest pain, unspecified: Secondary | ICD-10-CM | POA: Diagnosis not present

## 2013-06-23 DIAGNOSIS — F411 Generalized anxiety disorder: Secondary | ICD-10-CM | POA: Diagnosis not present

## 2013-06-23 LAB — BASIC METABOLIC PANEL
BUN: 18 mg/dL (ref 6–23)
CO2: 28 meq/L (ref 19–32)
CREATININE: 0.93 mg/dL (ref 0.50–1.35)
Calcium: 9.1 mg/dL (ref 8.4–10.5)
Chloride: 104 mEq/L (ref 96–112)
GFR calc Af Amer: >90 mL/min (ref >90)
GFR calc non Af Amer: 83 mL/min — ABNORMAL LOW (ref >90)
Glucose, Bld: 112 mg/dL — ABNORMAL HIGH (ref 70–99)
Potassium: 3 mEq/L — ABNORMAL LOW (ref 3.7–5.3)
Sodium: 145 mEq/L (ref 137–147)

## 2013-06-23 LAB — CBC
HCT: 39 % (ref 39.0–52.0)
Hemoglobin: 13.4 g/dL (ref 13.0–17.0)
MCH: 31.2 pg (ref 26.0–34.0)
MCHC: 34.4 g/dL (ref 30.0–36.0)
MCV: 90.9 fL (ref 78.0–100.0)
PLATELETS: 218 10*3/uL (ref 150–400)
RBC: 4.29 MIL/uL (ref 4.22–5.81)
RDW: 13.2 % (ref 11.5–15.5)
WBC: 7.4 10*3/uL (ref 4.0–10.5)

## 2013-06-23 LAB — IGE: IGE (IMMUNOGLOBULIN E), SERUM: 50.5 [IU]/mL (ref 0.0–180.0)

## 2013-06-23 MED ORDER — PANTOPRAZOLE SODIUM 40 MG PO TBEC: 40.0000 mg | DELAYED_RELEASE_TABLET | Freq: Two times a day (BID) | ORAL | Status: DC

## 2013-06-23 MED ORDER — LORATADINE 10 MG PO TABS: 10.0000 mg | ORAL_TABLET | Freq: Every day | ORAL | Status: DC

## 2013-06-23 MED ORDER — ALPRAZOLAM 0.5 MG PO TABS: 0.5000 mg | ORAL_TABLET | Freq: Three times a day (TID) | ORAL | Status: DC | PRN

## 2013-06-23 MED ORDER — POTASSIUM CHLORIDE CRYS ER 20 MEQ PO TBCR
40.0000 meq | EXTENDED_RELEASE_TABLET | ORAL | Status: AC
  Administered 2013-06-23 (×2): 40 meq via ORAL
  Filled 2013-06-23 (×2): qty 2

## 2013-06-23 MED ORDER — PREDNISONE 10 MG PO TABS: ORAL_TABLET | ORAL | Status: DC

## 2013-06-23 NOTE — Discharge Summary (Addendum)
Physician Discharge Summary  [name] [name] [id number] DOB: [birth year] DOA: 06/21/2013  PCP: Kennon Portela, MD  Admit date: 06/21/2013 Discharge date: 06/23/2013  Time spent: 35 minutes  Recommendations for Outpatient Follow-up:  1. BMP 1 week re: K 2. Be sure follow up with Dr. Gwenette Greet for PFTs  Discharge Diagnoses:  Principal Problem:   Acute asthma exacerbation Active Problems:   OBSTRUCTIVE SLEEP APNEA   HYPERTENSION   ALLERGIC RHINITIS   CHEST DISCOMFORT   DYSPHAGIA UNSPECIFIED   Anxiety disorder   Left ventricular systolic dysfunction   Chronic cough   Asthma   Asthma attack   Post traumatic stress disorder   Unspecified sinusitis (chronic)   Asthma exacerbation   Upper airway cough syndrome   GERD (gastroesophageal reflux disease)   Seasonal allergies   Chronic sinusitis   Discharge Condition: improved  Diet recommendation: cardiac  Filed Weights   06/21/13 1911 06/21/13 2215  Weight: 104.327 kg (230 lb) 106.142 kg (234 lb)    History of present illness:  73 yo male h/o several months of wheezing, sob has been on several prednisone tapers and the exacerbations continue. No fevers. He is under the care of a pulmonologist and was taken off his advair several weeks ago in order to get PFT done on may 29. He denies any le edema/swelling. Nonsmoker. No childhood h/o asthma. Not formally dx with copd. He comes in again today with wheezing despite using his home alb inhaler and nebulizer. He just got a neb machine about 3 days ago. He has been off predisone for couple of weeks. He is much improved with iv solumedrol and albuterol neb in the ED   Hospital Course:  Acute asthma exacerbation?- was awaiting formal PFTs as an outpatient-  taper prednisone, nebs.  Seen by pulmonologist- added claritin and PPI -elevated d dimer- v/q scan negative   OBSTRUCTIVE SLEEP APNEA - does not tolerate CPAP  obesity - encouraged weight loss  HYPERTENSION   Anxiety  disorder - says PCP gives him xanax when he is on steroids   Left ventricular systolic dysfunction- BNP low; last echo in 2012 showed: estimated ejection fraction was in the range of 40% to 45%. Diffusehypokinesis. Doppler parameters are consistent with abnormal left ventricular relaxation (grade 1 diastolicdysfunction).    Procedures:  spirometry with graph  Consultations:  pulm  Discharge Exam: Filed Vitals:   06/23/13 0503  BP: 148/71  Pulse: 64  Temp: 97.5 F (36.4 C)  Resp:     General: A+Ox3, NAd Cardiovascular: rrr Respiratory: exp wheezing only  Discharge Instructions You were cared for by a hospitalist during your hospital stay. If you have any questions about your discharge medications or the care you received while you were in the hospital after you are discharged, you can call the unit and asked to speak with the hospitalist on call if the hospitalist that took care of you is not available. Once you are discharged, your primary care physician will handle any further medical issues. Please note that NO REFILLS for any discharge medications will be authorized once you are discharged, as it is imperative that you return to your primary care physician (or establish a relationship with a primary care physician if you do not have one) for your aftercare needs so that they can reassess your need for medications and monitor your lab values.      Discharge Orders   Future Appointments Provider Department Dept Phone   07/08/2013 9:00 AM [facility] [phone]   07/08/2013 10:00 AM Kathee Delton, MD [facility] [phone]   07/29/2013 9:30 AM Philmore Pali, NP [facility] [phone]   Future Orders Complete By Expires   Diet - low sodium heart healthy  As directed    Discharge instructions  As directed    Increase activity slowly  As directed        Medication List         albuterol 108 (90 BASE) MCG/ACT  inhaler  Commonly known as:  PROAIR HFA  Inhale 2 puffs into the lungs every 4 (four) hours as needed for wheezing.     ALPRAZolam 0.5 MG tablet  Commonly known as:  XANAX  Take 1 tablet (0.5 mg total) by mouth 3 (three) times daily as needed for anxiety.     aspirin 325 MG EC tablet  Take 162.5 mg by mouth daily.     chlorthalidone 25 MG tablet  Commonly known as:  HYGROTON  Take 12.5 mg by mouth daily.     cholecalciferol 1000 UNITS tablet  Commonly known as:  VITAMIN D  Take 2,000 Units by mouth daily.     COMPOUNDED TOPICALS BUILDER  Apply 1 application topically 4 (four) times daily as needed. Multiple meds in compound-- Applies to lower legs and feet     fluticasone 50 MCG/ACT nasal spray  Commonly known as:  FLONASE  Place 2 sprays into the nose daily.     GRALISE 600 MG Tabs  Generic drug:  Gabapentin (PHN)  Take 1,800 mg by mouth every evening.     HYCET 7.5-325 mg/15 ml solution  Generic drug:  HYDROcodone-acetaminophen  Take 5 mLs by mouth every 6 (six) hours as needed (for cough).     lidocaine 5 %  Commonly known as:  LIDODERM  Place 0.5 patches onto the skin daily. Remove & Discard patch within 12 hours or as directed by MD     loratadine 10 MG tablet  Commonly known as:  CLARITIN  Take 1 tablet (10 mg total) by mouth daily.     pantoprazole 40 MG tablet  Commonly known as:  PROTONIX  Take 1 tablet (40 mg total) by mouth 2 (two) times daily before a meal.     predniSONE 10 MG tablet  Commonly known as:  DELTASONE  30mg  x 3 day, 20 mg x 3 days, 10 mg x 3 days, 5 mg x 4 days     PRESERVISION AREDS 2 Caps  Take 1 capsule by mouth 2 (two) times daily.     ONE-A-DAY 50 PLUS PO  Take 1 tablet by mouth daily.     verapamil 120 MG CR tablet  Commonly known as:  CALAN-SR  Take 120 mg by mouth daily.     Vitamin B Complex Tabs  Take 1 tablet by mouth daily.     vitamin C 1000 MG tablet  Take 1,000 mg by mouth daily.       Allergies  Allergen  Reactions  . Shrimp [Shellfish Allergy] Anaphylaxis  . Contrast Media [Iodinated Diagnostic Agents] Other (See Comments)    Skin reaction-not sure exactly-per MD  . Morphine Hives  . Oxycodone-Acetaminophen Nausea And Vomiting  . Penicillins Swelling and Rash   Follow-up Information   Follow up with GUEST, Veneda Melter, MD In 1 week.   Specialty:  Internal Medicine   Contact information:   [address] [place] [address] [phone]       Follow  up with Kathee Delton, MD In 1 month.   Specialty:  Pulmonary Disease   Contact information:   [place] [address] [phone]        The results of significant diagnostics from this hospitalization (including imaging, microbiology, ancillary and laboratory) are listed below for reference.    Significant Diagnostic Studies: Dg Chest 2 View  06/21/2013   CLINICAL DATA:  Cough, congestion, shortness of breath  EXAM: CHEST  2 VIEW  COMPARISON:  CT chest dated 05/26/2013  FINDINGS: Lungs are essentially clear. No focal consolidation. No pleural effusion or pneumothorax.  The heart is normal in size.  Mild degenerative changes of the visualized thoracolumbar spine.  Stable postsurgical changes involving the right shoulder.  IMPRESSION: No evidence of acute cardiopulmonary disease.   Electronically Signed   By: Julian Hy M.D.   On: 06/21/2013 19:41   Ct Chest Wo Contrast  05/26/2013   CLINICAL DATA:  Followup pulmonary nodule  EXAM: CT CHEST WITHOUT CONTRAST  TECHNIQUE: Multidetector CT imaging of the chest was performed following the standard protocol without IV contrast.  COMPARISON:  05/25/2012  FINDINGS: There is no pleural effusion identified. No airspace consolidation or atelectasis identified. Stable 5 mm subpleural nodule in the superior segment of the right lower lobe.  The heart size is normal. No pericardial effusion. Calcified atherosclerotic changes are noted involving the LAD coronary artery. No  enlarged axillary or supraclavicular adenopathy.  Incidental imaging through the upper abdomen shows no acute findings.  Review of the visualized osseous structures is significant for previous right shoulder arthroplasty. There is mild spondylosis within the thoracic spine.  IMPRESSION: 1. No acute cardiopulmonary abnormalities. 2. Stable pulmonary nodule in the right lower lobe. If this patient is at low risk for lung cancer then no further followup is necessary. In a high risk patient the next followup examination should be obtained at 18-24 months. This recommendation follows the consensus statement: Guidelines for Management of Small Pulmonary Nodules Detected on CT Scans: A Statement from the [place] as published in Radiology 2005; 237:395-400.   Electronically Signed   By: Kerby Moors M.D.   On: 05/26/2013 13:39   Nm Pulmonary Perf And Vent  06/22/2013   CLINICAL DATA:  Shortness of breath, shortness of breath, elevated D-dimer, hypoxia, evaluate for pulmonary embolism  EXAM: NUCLEAR MEDICINE VENTILATION - PERFUSION LUNG SCAN  TECHNIQUE: Ventilation images were obtained in multiple projections using inhaled aerosol technetium 99 M DTPA. Perfusion images were obtained in multiple projections after intravenous injection of Tc-34m MAA.  RADIOPHARMACEUTICALS:  6 mCi Tc-38m DTPA aerosol and 40 mCi Tc-85m MAA  COMPARISON:  DG CHEST 2V dated 06/21/2013; CT CHEST W/O CM dated 05/26/2013  FINDINGS: Review of chest radiograph performed 06/21/2013 demonstrates a normal cardiac silhouette and mediastinal contours. The lungs are hyperexpanded with flattening of the bilateral may diaphragms. Minimal bilateral infrahilar heterogeneous opacities favored to represent atelectasis or scar. No discrete focal airspace opacities. No pleural effusion or pneumothorax. Post right-sided pleural shoulder replacement with displacement of the glenoid replacement hardware, incompletely evaluated.  Ventilation: There is mild  clumping of inhaled radiotracer about the bilateral pulmonary hila. There is mild diffuse mottled ventilation of the pulmonary parenchyma without discrete area of non ventilation. Expected photopenia defect is seen on the right lateral projection image secondary to the patient's right humeral prosthesis hardware. Ingested radiotracer is seen within the oropharynx, hypopharynx, esophagus and stomach.  Perfusion: There is homogeneous distribution of injected radiotracer throughout the pulmonary  parenchyma. There are no discrete segmental or subsegmental mismatched filling defects to suggest pulmonary embolism. There is a minimal amount of extravasated radiotracer about the left upper extremity IV site. There is an expected photopenic defect on the right lateral projection image secondary to the patient's overlying right humeral prosthesis hardware.  IMPRESSION: Pulmonary embolism absent (normal V/Q scan).   Electronically Signed   By: Sandi Mariscal M.D.   On: 06/22/2013 15:39   [place] Cm  05/31/2013   CLINICAL DATA:  Chronic sinusitis. Productive cough. Unresponsive to antibiotics.  EXAM: CT PARANASAL SINUS WITHOUT CONTRAST  TECHNIQUE: Multidetector CT images of the paranasal sinuses were obtained using the standard protocol without intravenous contrast.  COMPARISON:  Most recent CT head 06/14/2011.  FINDINGS: Slight dependent fluid in the maxillary sinuses. Clear frontal, ethmoid, and sphenoid sinuses. Bilateral middle turbinate concha bullosa. Slight nasal septal deviation left-to-right of 1 2 mm. No nasal cavity masses. Grossly negative orbits. No acute intracranial findings are evident. Dental amalgam. No facial abnormality.  IMPRESSION: Mild chronic bilateral maxillary sinus disease. No significant fluid accumulation or definite air-fluid level.   Electronically Signed   By: Rolla Flatten M.D.   On: 05/31/2013 09:13    Microbiology: No results found for this or any previous visit (from the  past 240 hour(s)).   Labs: Basic Metabolic Panel:  Recent Labs Lab 06/21/13 1922 06/23/13 0316  NA 139 145  K 3.5* 3.0*  CL 96 104  CO2 30 28  GLUCOSE 109* 112*  BUN 13 18  CREATININE 0.95 0.93  CALCIUM 9.5 9.1   Liver Function Tests:  Recent Labs Lab 06/21/13 1922  AST 35  ALT 28  ALKPHOS 72  BILITOT 2.2*  PROT 7.3  ALBUMIN 4.4   No results found for this basename: LIPASE, AMYLASE,  in the last 168 hours No results found for this basename: AMMONIA,  in the last 168 hours CBC:  Recent Labs Lab 06/21/13 1703 06/21/13 1922 06/23/13 0316  WBC 5.6 6.1 7.4  NEUTROABS  --  3.8  --   HGB 14.8 14.8 13.4  HCT 45.5 42.5 39.0  MCV 95.2 90.2 90.9  PLT  --  222 218   Cardiac Enzymes: No results found for this basename: CKTOTAL, CKMB, CKMBINDEX, TROPONINI,  in the last 168 hours BNP: BNP (last 3 results) No results found for this basename: PROBNP,  in the last 8760 hours CBG: No results found for this basename: GLUCAP,  in the last 168 hours     Signed:  Geradine Girt  [facility] 06/23/2013, 2:30 PM

## 2013-06-23 NOTE — Progress Notes (Signed)
PULMONARY / CRITICAL CARE MEDICINE   Name: [name] [name] MRN: [id number] DOB: 06-Oct-1940    ADMISSION DATE:  06/21/2013 CONSULTATION DATE: 06/22/2013  REFERRING MD :  Eliseo Squires PRIMARY SERVICE: [facility]  CHIEF COMPLAINT:  Dyspnea   BRIEF PATIENT DESCRIPTION:  73 yo remote smoker whose CT scan one year ago demonstrated R lung nodule. He has progressive respiratory symptoms since about 6 month ago associated with seasonal allergies. Followed by Dr.Clance for lung nodule and OSA (intolerable to CPAP). Has had multiple rounds of abx/steroids over last 4 months with some temporary improvement. Recently taken off Advair and was scheduled to have outpatient  PFT but developed acute resp distress 5/11 and was admitted. "I couldn't breathe."  SIGNIFICANT EVENTS / STUDIES:   LINES / TUBES:  CULTURES:  ANTIBIOTICS:  INTERVAL HISTORY: Feels better, less dyspnea  VITAL SIGNS: Temp:  [97.5 F (36.4 C)-98.1 F (36.7 C)] 97.5 F (36.4 C) (05/14 0503) Pulse Rate:  [54-67] 64 (05/14 0503) Resp:  [17-18] 18 (05/13 2200) BP: (128-148)/(71-86) 148/71 mmHg (05/14 0503) SpO2:  [91 %-98 %] 94 % (05/14 0503)  HEMODYNAMICS:   VENTILATOR SETTINGS:   INTAKE / OUTPUT: Intake/Output     05/13 0701 - 05/14 0700 05/14 0701 - 05/15 0700   P.O. 240    Total Intake(mL/kg) 240 (2.3)    Net +240          Urine Occurrence 2 x      PHYSICAL EXAMINATION: General: No acute distress Neuro: Awake, alert, oriented HEENT:  No JVD Cardiovascular:  Regular, no murmurs Lungs: Improved air entry compared to yesterday, expiratory wheezing, prolonged expiratory phase Abdomen:  Soft  Musculoskeletal:  Rt shoulder with chronic fx Skin:  No rash  LABS:  CBC  Recent Labs Lab 06/21/13 1703 06/21/13 1922 06/23/13 0316  WBC 5.6 6.1 7.4  HGB 14.8 14.8 13.4  HCT 45.5 42.5 39.0  PLT  --  222 218   Coag's No results found for this basename: APTT, INR,  in the last 168 hours BMET  Recent Labs Lab 06/21/13 1922  06/23/13 0316  NA 139 145  K 3.5* 3.0*  CL 96 104  CO2 30 28  BUN 13 18  CREATININE 0.95 0.93  GLUCOSE 109* 112*   Electrolytes  Recent Labs Lab 06/21/13 1922 06/23/13 0316  CALCIUM 9.5 9.1   Sepsis Markers No results found for this basename: LATICACIDVEN, PROCALCITON, O2SATVEN,  in the last 168 hours ABG No results found for this basename: PHART, PCO2ART, PO2ART,  in the last 168 hours Liver Enzymes  Recent Labs Lab 06/21/13 1922  AST 35  ALT 28  ALKPHOS 72  BILITOT 2.2*  ALBUMIN 4.4   Cardiac Enzymes No results found for this basename: TROPONINI, PROBNP,  in the last 168 hours  Glucose No results found for this basename: GLUCAP,  in the last 168 hours  IMAGING:   Dg Chest 2 View  06/21/2013   CLINICAL DATA:  Cough, congestion, shortness of breath  EXAM: CHEST  2 VIEW  COMPARISON:  CT chest dated 05/26/2013  FINDINGS: Lungs are essentially clear. No focal consolidation. No pleural effusion or pneumothorax.  The heart is normal in size.  Mild degenerative changes of the visualized thoracolumbar spine.  Stable postsurgical changes involving the right shoulder.  IMPRESSION: No evidence of acute cardiopulmonary disease.   Electronically Signed   By: Julian Hy M.D.   On: 06/21/2013 19:41   Nm Pulmonary Perf And Vent  06/22/2013   CLINICAL DATA:  Shortness of breath, shortness of breath, elevated D-dimer, hypoxia, evaluate for pulmonary embolism  EXAM: NUCLEAR MEDICINE VENTILATION - PERFUSION LUNG SCAN  TECHNIQUE: Ventilation images were obtained in multiple projections using inhaled aerosol technetium 99 M DTPA. Perfusion images were obtained in multiple projections after intravenous injection of Tc-75m MAA.  RADIOPHARMACEUTICALS:  6 mCi Tc-46m DTPA aerosol and 40 mCi Tc-40m MAA  COMPARISON:  DG CHEST 2V dated 06/21/2013; CT CHEST W/O CM dated 05/26/2013  FINDINGS: Review of chest radiograph performed 06/21/2013 demonstrates a normal cardiac silhouette and mediastinal  contours. The lungs are hyperexpanded with flattening of the bilateral may diaphragms. Minimal bilateral infrahilar heterogeneous opacities favored to represent atelectasis or scar. No discrete focal airspace opacities. No pleural effusion or pneumothorax. Post right-sided pleural shoulder replacement with displacement of the glenoid replacement hardware, incompletely evaluated.  Ventilation: There is mild clumping of inhaled radiotracer about the bilateral pulmonary hila. There is mild diffuse mottled ventilation of the pulmonary parenchyma without discrete area of non ventilation. Expected photopenia defect is seen on the right lateral projection image secondary to the patient's right humeral prosthesis hardware. Ingested radiotracer is seen within the oropharynx, hypopharynx, esophagus and stomach.  Perfusion: There is homogeneous distribution of injected radiotracer throughout the pulmonary parenchyma. There are no discrete segmental or subsegmental mismatched filling defects to suggest pulmonary embolism. There is a minimal amount of extravasated radiotracer about the left upper extremity IV site. There is an expected photopenic defect on the right lateral projection image secondary to the patient's overlying right humeral prosthesis hardware.  IMPRESSION: Pulmonary embolism absent (normal V/Q scan).   Electronically Signed   By: Sandi Mariscal M.D.   On: 06/22/2013 15:39    ASSESSMENT AND PLAN:  Acute asthma exacerbation Seasonal allergies Allergic rhynitis Chronic sinusitis Upper airway cough syndrome Pulmonary nodule OSA Possible GERD   Goal SpO2>92   Supplemental oxygen  Albuterol  Prednisone  Claritin  Flonase / nasal saline  Protonix  Simple spirometry before and after bronchodilator  Ambulatory SpO2  IgE level  Follow up with Dr. Gwenette Greet  Will follow  06/23/2013, 8:28 AM  I have personally obtained history, examined patient, evaluated and interpreted laboratory and imaging  results, reviewed medical records, formulated assessment / plan and placed orders.  Doree Fudge, MD [facility] [place] Pager: [phone]  06/23/2013, 8:28 AM

## 2013-06-23 NOTE — Progress Notes (Signed)
SATURATION QUALIFICATIONS: (This note is used to comply with regulatory documentation for home oxygen)  Patient Saturations on Room Air at Rest = 94%  Patient Saturations on Room Air while Ambulating = 91-94%   

## 2013-06-23 NOTE — Progress Notes (Signed)
Peak flow Pre 330 Post 310

## 2013-06-24 ENCOUNTER — Encounter: Payer: Self-pay | Admitting: Internal Medicine

## 2013-06-28 ENCOUNTER — Ambulatory Visit (HOSPITAL_COMMUNITY): Admit: 2013-06-28 | Payer: Self-pay | Admitting: Internal Medicine

## 2013-06-28 ENCOUNTER — Encounter (HOSPITAL_COMMUNITY): Payer: Self-pay

## 2013-06-28 SURGERY — VIDEO BRONCHOSCOPY WITHOUT FLUORO
Anesthesia: Moderate Sedation | Laterality: Bilateral

## 2013-06-30 LAB — SPIROMETRY WITH GRAPH
FEF 25-75 POST: 1.8 L/s
FEF 25-75 Pre: 1.73 L/sec
FEF2575-%Change-Post: 3 %
FEF2575-%PRED-POST: 73 %
FEF2575-%PRED-PRE: 70 %
FEV1-%Change-Post: 1 %
FEV1-%PRED-POST: 74 %
FEV1-%PRED-PRE: 73 %
FEV1-POST: 2.45 L
FEV1-Pre: 2.4 L
FEV1FVC-%CHANGE-POST: -2 %
FEV1FVC-%PRED-PRE: 98 %
FEV6-%CHANGE-POST: 3 %
FEV6-%Pred-Post: 82 %
FEV6-%Pred-Pre: 79 %
FEV6-Post: 3.46 L
FEV6-Pre: 3.33 L
FEV6FVC-%Change-Post: 0 %
FEV6FVC-%Pred-Post: 105 %
FEV6FVC-%Pred-Pre: 106 %
FVC-%CHANGE-POST: 4 %
FVC-%PRED-POST: 78 %
FVC-%Pred-Pre: 74 %
FVC-Post: 3.48 L
FVC-Pre: 3.34 L
POST FEV1/FVC RATIO: 70 %
PRE FEV1/FVC RATIO: 72 %
PRE FEV6/FVC RATIO: 100 %
Post FEV6/FVC ratio: 99 %

## 2013-07-07 ENCOUNTER — Other Ambulatory Visit: Payer: Self-pay | Admitting: Pulmonary Disease

## 2013-07-07 DIAGNOSIS — R059 Cough, unspecified: Secondary | ICD-10-CM

## 2013-07-07 DIAGNOSIS — R05 Cough: Secondary | ICD-10-CM

## 2013-07-08 ENCOUNTER — Ambulatory Visit (INDEPENDENT_AMBULATORY_CARE_PROVIDER_SITE_OTHER): Payer: Medicare Other | Admitting: Pulmonary Disease

## 2013-07-08 ENCOUNTER — Encounter: Payer: Self-pay | Admitting: Pulmonary Disease

## 2013-07-08 VITALS — BP 130/80 | HR 60 | Temp 97.1°F | Ht 69.0 in | Wt 237.0 lb

## 2013-07-08 DIAGNOSIS — J45909 Unspecified asthma, uncomplicated: Secondary | ICD-10-CM

## 2013-07-08 DIAGNOSIS — R059 Cough, unspecified: Secondary | ICD-10-CM

## 2013-07-08 DIAGNOSIS — R05 Cough: Secondary | ICD-10-CM

## 2013-07-08 NOTE — Assessment & Plan Note (Signed)
The patient clearly has some degree of airflow obstruction by his PFTs, and given his minimal smoking history and normal DLCO, this is consistent with asthma. It had a long discussion with him about the pathophysiology of asthma, and the need to stay on chronic inhaled corticosteroids. I would like to try him on Symbicort since it is less irritating to the upper airway than Advair. The patient will call may with his response in the next 2-3 weeks. I do not think this is responsible for the majority of his prior cough, with his history sounding primarily upper airway in origin. He does have chronic sinusitis by his CT of his sinuses, and was on an ACE inhibitor at that time. Will monitor this going forward.

## 2013-07-08 NOTE — Progress Notes (Signed)
PFT done today. 

## 2013-07-08 NOTE — Patient Instructions (Signed)
Will start on symbicort 160/4.5  2 inhalations am and pm everyday.  Keep mouth rinsed well. Please call me in 2-3 weeks for update.  We can send in prescription at that time if doing well.  followup with me again in 21mos.

## 2013-07-08 NOTE — Progress Notes (Signed)
   Subjective:    Patient ID: [name] [name], male    DOB: Aug 09, 1940, 73 y.o.   MRN: [id number]  HPI Patient comes in today after his pulmonary function studies. At his last visit he was having chronic cough but I suspect it was secondary to his ACE inhibitor, and possibly his sinus disease. He has been taken off his ACE inhibitor. His Advair was also discontinued because of irritation to the upper airway, and the plans were for followup PFTs to evaluate for obstructive airways disease. Unfortunately, the patient has required a hospitalization in the interim either secondary to asthma or a mild CHF flare. He comes in today where he is doing much better, and is only on albuterol for rescue. His PFTs showed a normal spirometry, but he did have a 14% improvement in his FEV1 with bronchodilators. His total lung capacity was normal, but there was significant air trapping noted. His diffusion capacity was totally normal, but his flow volume loop showed subtle obstruction on the expiratory limb.   Review of Systems  Constitutional: Negative for fever and unexpected weight change.  HENT: Negative for congestion, dental problem, ear pain, nosebleeds, postnasal drip, rhinorrhea, sinus pressure, sneezing, sore throat and trouble swallowing.   Eyes: Negative for redness and itching.  Respiratory: Positive for cough and wheezing. Negative for chest tightness and shortness of breath.   Cardiovascular: Negative for palpitations and leg swelling.  Gastrointestinal: Negative for nausea and vomiting.  Genitourinary: Negative for dysuria.  Musculoskeletal: Negative for joint swelling.  Skin: Negative for rash.  Neurological: Negative for headaches.  Hematological: Does not bruise/bleed easily.  Psychiatric/Behavioral: Negative for dysphoric mood. The patient is not nervous/anxious.        Objective:   Physical Exam Overweight male in no acute distress Nose without purulence or discharge noted Neck without  lymphadenopathy or thyromegaly Chest totally clear to auscultation, no wheezing Cardiac exam with regular rate and rhythm Lower extremities without edema, no cyanosis Alert and oriented, moves all 4 extremities.       Assessment & Plan:

## 2013-07-13 ENCOUNTER — Telehealth: Payer: Self-pay

## 2013-07-13 NOTE — Telephone Encounter (Signed)
[facility] faxed req for chlorthalidone Rx. Dr Elder Cyphers, you have recently Rxd 25 mg tabs, 1 QD. I noticed that since then, you sent pt to hosp and it looks like he was [facility] on the 25 mg, but just 1/2 tab QD. Which dose do you want pt taking? Pended w/sig from hosp.

## 2013-07-14 ENCOUNTER — Other Ambulatory Visit: Payer: Self-pay | Admitting: Internal Medicine

## 2013-07-15 ENCOUNTER — Ambulatory Visit: Payer: Medicare Other | Admitting: Nurse Practitioner

## 2013-07-15 LAB — PULMONARY FUNCTION TEST
DL/VA % PRED: 122 %
DL/VA: 5.56 ml/min/mmHg/L
DLCO UNC % PRED: 98 %
DLCO UNC: 30.57 ml/min/mmHg
FEF 25-75 POST: 2.79 L/s
FEF 25-75 PRE: 1.78 L/s
FEF2575-%Change-Post: 56 %
FEF2575-%PRED-PRE: 76 %
FEF2575-%Pred-Post: 120 %
FEV1-%Change-Post: 14 %
FEV1-%Pred-Post: 92 %
FEV1-%Pred-Pre: 80 %
FEV1-POST: 2.84 L
FEV1-Pre: 2.47 L
FEV1FVC-%Change-Post: 8 %
FEV1FVC-%Pred-Pre: 98 %
FEV6-%CHANGE-POST: 8 %
FEV6-%PRED-POST: 92 %
FEV6-%PRED-PRE: 85 %
FEV6-POST: 3.65 L
FEV6-PRE: 3.38 L
FEV6FVC-%CHANGE-POST: 1 %
FEV6FVC-%Pred-Post: 106 %
FEV6FVC-%Pred-Pre: 104 %
FVC-%CHANGE-POST: 5 %
FVC-%PRED-POST: 86 %
FVC-%PRED-PRE: 81 %
FVC-POST: 3.65 L
FVC-PRE: 3.44 L
POST FEV6/FVC RATIO: 100 %
PRE FEV1/FVC RATIO: 72 %
Post FEV1/FVC ratio: 78 %
Pre FEV6/FVC Ratio: 98 %
RV % pred: 142 %
RV: 3.44 L
TLC % pred: 100 %
TLC: 6.89 L

## 2013-07-16 NOTE — Telephone Encounter (Signed)
1/2 tab qd  Thx

## 2013-07-18 MED ORDER — CHLORTHALIDONE 25 MG PO TABS: 12.5000 mg | ORAL_TABLET | Freq: Every day | ORAL | Status: DC

## 2013-07-18 NOTE — Telephone Encounter (Signed)
Sent Rx for 1/2 tab QD.

## 2013-07-29 ENCOUNTER — Encounter: Payer: Self-pay | Admitting: Nurse Practitioner

## 2013-07-29 ENCOUNTER — Ambulatory Visit (INDEPENDENT_AMBULATORY_CARE_PROVIDER_SITE_OTHER): Payer: Medicare Other | Admitting: Nurse Practitioner

## 2013-07-29 VITALS — BP 151/90 | HR 78 | Wt 240.0 lb

## 2013-07-29 DIAGNOSIS — M5416 Radiculopathy, lumbar region: Secondary | ICD-10-CM

## 2013-07-29 DIAGNOSIS — IMO0002 Reserved for concepts with insufficient information to code with codable children: Secondary | ICD-10-CM | POA: Diagnosis not present

## 2013-07-29 MED ORDER — NONFORMULARY OR COMPOUNDED ITEM: Status: DC

## 2013-07-29 MED ORDER — GABAPENTIN (ONCE-DAILY) 600 MG PO TABS: 1.0000 | ORAL_TABLET | Freq: Two times a day (BID) | ORAL | Status: DC

## 2013-07-29 NOTE — Progress Notes (Signed)
PATIENT: [name] [name] DOB: 07-17-1940  REASON FOR VISIT: routine follow up for lumbar radiculopathy HISTORY FROM: patient  HISTORY OF PRESENT ILLNESS: UPDATE 07/29/13 (LL): Since last visit, patient has had good relief with transdermal therapeutic cream. He wants to continue to use a although the price went up to $300 for 3 tubes. He would like to get it cheaper at a local pharmacy if possible. His wife was noticing more memory loss mainly short-term. Dr. Gwenette Greet recommended he decrease his dose of Gralise by half. While he notices that this helped with his short-term memory he also has increased pain. Right shoulder pain continues to be treated with Lidoderm patch. Since last visit he was hospitalized with breathing problems and was diagnosed with adult-onset asthma.  UPDATE 01/14/13: 73 year old male here for evaluation and followup of lumbar radiculopathy pain. Patient formerly evaluated by Dr. Erling Cruz. Patient is on gabapentin and Lidoderm patches for pain control. Gabapentin seems to wear off in the night. He also has tried hydrocodone to PCP with mild relief. His main complaint today is pain from his knees down to his feet. This is chronic and suspected to be related to lumbar radiculopathy.  Also of note patient has history of left ICA dissection in 1994, treated conservatively with antiplatelet therapy (aspirin). 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 Patient has history of cervical and lumbar compression fractures. Currently he is also dealing with the right shoulder pain and right lung lesion under surveillance.  PRIOR HPI (03/02/12, Dr. Erling Cruz): 73 year old right-handed white married male with a history of spontaneous dissection of his left internal carotid artery in 1996 associated with "numb tongue" syndrome and left-sided headache. He was treated with Coumadin therapy and the dissection gradually resolved. He was admitted to [facility] 10/1999 with a right kidney  infarct causing right sided flank pain. 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 There was also weblike stenosis of accessory left renal artery supplying the upper pole. The stenosis was noted at the bifurcation the vessels. There was no evidence of atherosclerotic disease in the aorta or iliac arteries. The Iliac arteries were tortuous. It was felt the underlying cause of the abnormality in the right inferior lobar artery and accessory left renal artery was on the basis of fibromuscular dysplasia or intrinsic arteritis. Workup for collagen vascular disorder was negative. 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 He has a long history neck pain with C5-C6 fusion in 1984. He had his right shoulder replaced in 1997 with only fair results. He has a residual fracture near one of the screws and orthopedic consultation with Dr. Windle Guard in [place] stated it would take one and a half years and 4 operations to treat the shoulder. He had a second opinion at the [facility] and is considering surgery. He has a history of spinal compression fractures over 35 years ago.He has a bone spur at his ankle and pain with walking. He has right wrist arthritis. He has pain in the back of his neck soreness to touch and pain in his right shoulder all the time though there is some pain in both shoulders. He has tingling in the thumb,index, and third finger of his right hand and rarely fourth and fifth finger. He as similar symptoms in his left hand but not as severe. EMG/NCV 10/15/10 showed  normal motor conduction velocity of the upper extremities and mild chronic stable changes of a right C6 radiculopathy. He has awakened at night with tremor and his hand/arm shaking, as often as 3 times per week. He denies Lhermitte's sign or bowel or bladder dysfunction. The  numbness in his hand comes and goes. I reviewed the MRI study of the cervical spine 07/23/2010 showing solid interbody fusion at C5-6, DJD and spondylosis and C6-7 causing stenosis and mild foraminal narrowing bilaterally. Mild DJD at C4- 5 with what I think is significant facet disease at C4-5 on the right. he continues to have pain. This affects his ability to walk. He has a handicapped bathroom in his home and has built a ramp. He takes hydrocodone-acetaminophen 5-325 approximately 5 a week. He is followed by Dr. Lawerance Bach urologist for the question of prostate cancer.PSA 08/29/2011 was 4.2. He underwent CT scan of his right shoulder and an asymptomatic "spot" on the right lung was found. He is having a followup CT scan 2014 before considering surgery to his right shoulder at the [facility] [place]. He denies shortness of breath, chest pain, hemoptysis, or weight loss He complains of numbness in both hands and both feet, right hand greater than left predominantly in the thumb index and third finger. He has burning in both feet.This can awaken him at night with burning. He is using Lidoderm patches, a half on each foot and a half on the right shoulder. He describes fingertips turning white and then blue.Blood studies 08/29/2011 were normal CBC, CMP except total bilirubin 1.6, TSH,and B12.I note he has high blood sugars in some of Dr. Luiz Ochoa notes which raises the question of diabetes. He may need hemoglobin A1c   REVIEW OF SYSTEMS: Full 14 system review of systems performed and notable only for fatigue hearing loss ringing in ears blurred vision wheezing joint pain joint swelling anxiety tremor memory loss numbness weakness insomnia.  ALLERGIES: Allergies  Allergen Reactions  . Shrimp [Shellfish Allergy] Anaphylaxis  . Contrast Media [Iodinated Diagnostic Agents] Other (See Comments)    Skin reaction-not sure exactly-per MD  . Morphine Hives  . Oxycodone-Acetaminophen Nausea And Vomiting  .  Penicillins Swelling and Rash    HOME MEDICATIONS: Outpatient Prescriptions Prior to Visit  Medication Sig Dispense Refill  . ALPRAZolam (XANAX) 0.5 MG tablet Take 1 tablet (0.5 mg total) by mouth 3 (three) times daily as needed for anxiety.  10 tablet  0  . Ascorbic Acid (VITAMIN C) 1000 MG tablet Take 1,000 mg by mouth daily.        Marland Kitchen aspirin 325 MG EC tablet Take 162.5 mg by mouth daily.       . B Complex Vitamins (VITAMIN B COMPLEX) TABS Take 1 tablet by mouth daily.      . chlorthalidone (HYGROTON) 25 MG tablet Take 0.5 tablets (12.5 mg total) by mouth daily.  45 tablet  3  . cholecalciferol (VITAMIN D) 1000 UNITS tablet Take 2,000 Units by mouth daily.       . fluticasone (FLONASE) 50 MCG/ACT nasal spray Place 2 sprays into the nose daily.  16 g  11  . gabapentin (NEURONTIN) 300 MG capsule Take 900 mg by mouth daily.      Marland Kitchen HYDROcodone-acetaminophen (HYCET) 7.5-325 mg/15 ml solution Take 5 mLs by mouth every 6 (six) hours as needed (for cough).       . lidocaine (LIDODERM) 5 % Place 0.5 patches onto the skin daily. Remove & Discard patch within 12  hours or as directed by MD      . Multiple Vitamins-Minerals (ONE-A-DAY 50 PLUS PO) Take 1 tablet by mouth daily.      . Multiple Vitamins-Minerals (PRESERVISION AREDS 2) CAPS Take 1 capsule by mouth 2 (two) times daily.      Marland Kitchen PROAIR HFA 108 (90 BASE) MCG/ACT inhaler INHALE 2 PUFFS INTO THE LUNGS EVERY 4 HOURS AS NEEDED FOR WHEEZING  3 each  1  . verapamil (CALAN-SR) 120 MG CR tablet Take 120 mg by mouth daily.      . (No Medication Selected) Apply 1 application topically 4 (four) times daily as needed. Multiple meds in compound-- Applies to lower legs and feet       No facility-administered medications prior to visit.     PHYSICAL EXAM  Filed Vitals:   07/29/13 1134  BP: 151/90  Pulse: 78  Weight: 240 lb (108.863 kg)   Body mass index is 35.43 kg/(m^2). No exam data present   GENERAL EXAM:  Patient is in no distress; well  developed, nourished and groomed  CARDIOVASCULAR:  Regular rate and rhythm, no murmurs, no carotid bruits  NEUROLOGIC:  MENTAL STATUS: awake, alert, oriented to person, place and time, normal attention and concentration, language fluent, comprehension intact, naming intact, fund of knowledge appropriate  CRANIAL NERVE: no papilledema on fundoscopic exam, pupils equal and reactive to light, visual fields full to confrontation, extraocular muscles intact, no nystagmus, facial sensation and strength symmetric, hearing intact, palate elevates symmetrically, uvula midline, shoulder shrug symmetric, tongue midline.  MOTOR: LIMITED IN RUE DUE TO RIGHT SHOULDER PAIN. OTHERWISE normal bulk and tone, full strength in the LUE, BLE  SENSORY: SLIGHTLY DECR TO VIB AT TOES. OTHERWISE SYMM to light touch, temperature  COORDINATION: finger-nose-finger, fine finger movements normal  REFLEXES: deep tendon reflexes TRACE and symmetric; ABSENT AT TOES.  GAIT/STATION: narrow based gait.  ASSESSMENT AND PLAN 73 y.o. year old male here with:  Peripheral neuropathy by examination Right shoulder pain  Right wrist pain  Fibromuscular dysplasia with history of dissection of the left internal carotid artery and right renal artery  Status post cervical fusion  Cardiomyopathy  Hypertension   PLAN:  - Continue gralise 900 mg with dinner. - Continue compounded neuropathy cream.  Provided written Rx to see if he could have it compounded through [facility] for less. - Follow up in 6 months, sooner as needed.   Meds ordered this encounter  Medications  . Gabapentin, PHN, 600 MG TABS    Sig: Take 1 tablet by mouth 2 (two) times daily.    Dispense:  180 tablet    Refill:  3    Order Specific Question:  Supervising Provider    Answer:  Andrey Spearman R [[id number]]  . NONFORMULARY OR COMPOUNDED ITEM    Sig: Compounded Pain Cream.  Active Ingredients: Ketamine 10%, Baclofen 2%, Cyclobenzaprine 2%, Diclofenac 3%,  Gabapentin 6%, Bupivacaine 2%.   Dispense 240 gm. Apply 1-2 grams topically to lower legs and feet 3 times a day prn.    Dispense:  3 each    Refill:  5    Order Specific Question:  Supervising Provider    Answer:  Penni Bombard [[id number]]   Return in about 6 months (around 01/28/2014) for radiculopathy.  Philmore Pali, MSN, NP-C 07/29/2013, 1:00 PM [facility] [address] [place], [place] [address] [phone]  Note: This document was prepared with digital dictation and possible smart phrase technology. Any  transcriptional errors that result from this process are unintentional.

## 2013-07-29 NOTE — Patient Instructions (Signed)
Continue Gralise 600 mg for pain, 1-2 tablets per day.  Continue Transdermal [employer]rapeutics Compounded Cream as needed for foot and ankle pain.  If you can get this cheaper through [facility], I have provided an Rx to do so.  Follow up in 6 months , sooner as needed.

## 2013-08-01 ENCOUNTER — Encounter: Payer: Self-pay | Admitting: Gastroenterology

## 2013-08-01 ENCOUNTER — Telehealth: Payer: Self-pay | Admitting: Pulmonary Disease

## 2013-08-01 DIAGNOSIS — R972 Elevated prostate specific antigen [PSA]: Secondary | ICD-10-CM | POA: Diagnosis not present

## 2013-08-01 DIAGNOSIS — N400 Enlarged prostate without lower urinary tract symptoms: Secondary | ICD-10-CM | POA: Diagnosis not present

## 2013-08-01 DIAGNOSIS — N4340 Spermatocele of epididymis, unspecified: Secondary | ICD-10-CM | POA: Diagnosis not present

## 2013-08-01 MED ORDER — BUDESONIDE-FORMOTEROL FUMARATE 160-4.5 MCG/ACT IN AERO: 2.0000 | INHALATION_SPRAY | Freq: Two times a day (BID) | RESPIRATORY_TRACT | Status: DC

## 2013-08-01 NOTE — Telephone Encounter (Signed)
Called and spoke with pt and he stated that the symbicort has helped him a great deal.  He is having to use the proair prn and he is still having some wheezing but the pt stated that he is doing much better now.  Pt wanted [place] to be aware of this in case he felt something else needed to be done.    Pt requested that the symbicort be sent to local pharmacy for 1 inhaler and rx sent to mail order as well since it takes the mail order so long to be delivered.  Will forward to [facility] for further recs for the pt.

## 2013-08-01 NOTE — Telephone Encounter (Signed)
I would like for him to stay on symbicort, and try to minimize rescue inhaler as much as possible.  Would like to see him back in 4-6 weeks.

## 2013-08-02 NOTE — Telephone Encounter (Signed)
Pt is aware of [place] recs. appt scheduled for 09/07/13. rx's were already sent to the phrmacy

## 2013-08-08 ENCOUNTER — Telehealth: Payer: Self-pay | Admitting: Gastroenterology

## 2013-08-08 NOTE — Telephone Encounter (Signed)
Does not want to schedule at this time

## 2013-08-18 NOTE — Progress Notes (Signed)
I reviewed note and agree with plan.   Barnie Sopko R. Magdala Brahmbhatt, MD  Certified in Neurology, Neurophysiology and Neuroimaging  [facility] [address], [address] [place], [place] [address] [phone]   

## 2013-08-29 DIAGNOSIS — R972 Elevated prostate specific antigen [PSA]: Secondary | ICD-10-CM | POA: Diagnosis not present

## 2013-08-29 DIAGNOSIS — N400 Enlarged prostate without lower urinary tract symptoms: Secondary | ICD-10-CM | POA: Diagnosis not present

## 2013-08-29 DIAGNOSIS — N4340 Spermatocele of epididymis, unspecified: Secondary | ICD-10-CM | POA: Diagnosis not present

## 2013-09-07 ENCOUNTER — Ambulatory Visit: Payer: Medicare Other | Admitting: Pulmonary Disease

## 2013-09-15 ENCOUNTER — Ambulatory Visit (INDEPENDENT_AMBULATORY_CARE_PROVIDER_SITE_OTHER): Payer: Medicare Other | Admitting: Pulmonary Disease

## 2013-09-15 ENCOUNTER — Encounter: Payer: Self-pay | Admitting: Pulmonary Disease

## 2013-09-15 VITALS — BP 140/80 | HR 73 | Temp 98.1°F | Ht 70.0 in | Wt 242.4 lb

## 2013-09-15 DIAGNOSIS — J45909 Unspecified asthma, uncomplicated: Secondary | ICD-10-CM

## 2013-09-15 NOTE — Progress Notes (Signed)
   Subjective:    Patient ID: [name] [name], male    DOB: 1940-04-03, 73 y.o.   MRN: [id number]  HPI The patient comes in today for followup of his known asthma. He has been staying on Symbicort a regular basis, and has only used his rescue inhaler 4 times in the last 3 months. He feels his breathing is much improved, and has not had a significant cough.   Review of Systems  Constitutional: Negative for fever and unexpected weight change.  HENT: Negative for congestion, dental problem, ear pain, nosebleeds, postnasal drip, rhinorrhea, sinus pressure, sneezing, sore throat and trouble swallowing.   Eyes: Negative for redness and itching.  Respiratory: Negative for cough, chest tightness, shortness of breath and wheezing.   Cardiovascular: Negative for palpitations and leg swelling.  Gastrointestinal: Negative for nausea and vomiting.  Genitourinary: Negative for dysuria.  Musculoskeletal: Negative for joint swelling.  Skin: Negative for rash.  Neurological: Negative for headaches.  Hematological: Does not bruise/bleed easily.  Psychiatric/Behavioral: Negative for dysphoric mood. The patient is not nervous/anxious.        Objective:   Physical Exam Overweight male in no acute distress Nose without purulence or discharge noted Neck without lymphadenopathy or thyromegaly Chest totally clear to auscultation, no wheezing Cardiac exam with regular rate and rhythm Lower extremities without edema, no cyanosis Alert and oriented, moves all 4 extremities.       Assessment & Plan:

## 2013-09-15 NOTE — Assessment & Plan Note (Signed)
The patient is doing very well since being on Symbicort on a regular basis. He rarely needs his rescue inhaler, and has seen resolution of his cough, and feels that his breathing is stable. I have asked him to continue on this, and to work aggressively on weight loss.

## 2013-09-15 NOTE — Patient Instructions (Signed)
Stay on symbicort, and let us know if you are needing increased rescue inhaler use. followup with me again in 61mos.

## 2013-09-26 DIAGNOSIS — R21 Rash and other nonspecific skin eruption: Secondary | ICD-10-CM | POA: Diagnosis not present

## 2013-09-26 DIAGNOSIS — N400 Enlarged prostate without lower urinary tract symptoms: Secondary | ICD-10-CM | POA: Diagnosis not present

## 2013-09-26 DIAGNOSIS — R972 Elevated prostate specific antigen [PSA]: Secondary | ICD-10-CM | POA: Diagnosis not present

## 2013-09-26 DIAGNOSIS — N4340 Spermatocele of epididymis, unspecified: Secondary | ICD-10-CM | POA: Diagnosis not present

## 2013-10-03 ENCOUNTER — Telehealth: Payer: Self-pay | Admitting: Gastroenterology

## 2013-10-03 NOTE — Telephone Encounter (Signed)
Message left with [facility] manager to see if patient can have procedure there

## 2013-10-03 NOTE — Telephone Encounter (Signed)
yes

## 2013-10-03 NOTE — Telephone Encounter (Signed)
Patient advised he can have colonsocopy without sedation if he chooses. He will get back with [place] on scheduling. He is still not ready to schedule.

## 2013-10-03 NOTE — Telephone Encounter (Signed)
This gentleman would like to know if he can get a colonoscopy without sedation. He states he has had several procedures done since the last colonoscopy and ends up in a hypotensive state that "requires they wake me up, my feet are elevated and they have oxygen on me". He was advised by his PCP to ask if a no sedation colonoscopy could be done for him.

## 2013-10-03 NOTE — Telephone Encounter (Signed)
I have left a message for the patient to call back  

## 2013-10-18 ENCOUNTER — Other Ambulatory Visit: Payer: Self-pay | Admitting: Diagnostic Neuroimaging

## 2013-11-02 ENCOUNTER — Ambulatory Visit (INDEPENDENT_AMBULATORY_CARE_PROVIDER_SITE_OTHER): Payer: Medicare Other | Admitting: Family Medicine

## 2013-11-02 VITALS — BP 136/88 | HR 72 | Temp 98.0°F | Resp 18 | Ht 70.0 in | Wt 242.8 lb

## 2013-11-02 DIAGNOSIS — Z23 Encounter for immunization: Secondary | ICD-10-CM | POA: Diagnosis not present

## 2013-11-02 DIAGNOSIS — S61209A Unspecified open wound of unspecified finger without damage to nail, initial encounter: Secondary | ICD-10-CM

## 2013-11-02 DIAGNOSIS — S61219A Laceration without foreign body of unspecified finger without damage to nail, initial encounter: Secondary | ICD-10-CM

## 2013-11-02 NOTE — Progress Notes (Signed)
Metacarpal block with 4 cc 2% lidocaine plain.  Scrubbed with soap and water.  Xeroform cut to size and placed on wound.  Dressed.

## 2013-11-02 NOTE — Progress Notes (Signed)
 Chief Complaint:  Chief Complaint  Patient presents with  . Laceration    left hand--index finger laceration by cutting corn    HPI: [name] [name] is a 73 y.o. male who is here for  Left index finger pad laceration. Pleasant right hand dominant 73 y/o male with left index finger pad laceration about 2-3 hrs ago occurred while shucking corn. He has a wonderful garden adn grows corn among other things. He came in due to bleeding without any other neurovascular sxs. Not on blood thinners, not utd on tetanus or flu vaccine  Past Medical History  Diagnosis Date  . HTN (hypertension)   . PVC (premature ventricular contraction)   . History of agent Orange exposure   . Retinopathy     related to PVC's  . Dissection of carotid artery     history  . Dissection of renal artery     history  . GERD (gastroesophageal reflux disease)   . History of hepatitis 1969    [place], unknown what type  . Anal fissure   . Anxiety disorder   . Arthritis   . Asthma   . Adenomatous colon polyp   . Diverticulosis   . Gallstones   . Cardiomyopathy     EF of 40-45%   Past Surgical History  Procedure Laterality Date  . Appendectomy  1963  . Cervical fusion  1985  . Cholecystectomy  1990  . Tonsillectomy  1965  . Hernia repair    . Carotid disection    . Right renal artery disection    . Nose surgery    . Vasectomy    . Total shoulder replacement      Right   History   Social History  . Marital Status: Married    Spouse Name: Santiago Glad    Number of Children: 3  . Years of Education: 16   Occupational History  . ex marine   . retired     Equities trader   Social History Main Topics  . Smoking status: Former Smoker -- 0.50 packs/day for 15 years    Types: Cigarettes    Quit date: 02/10/1978  . Smokeless tobacco: Never Used     Comment: QUIT late 70s/early 58s  . Alcohol Use: Yes     Comment: 0-3 daily  . Drug Use: No  . Sexual Activity: None   Other Topics  Concern  . None   Social History Narrative   Lives in [place].   Married in [place] from previous marriage.   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 Elder Cyphers   Family History  Problem Relation Age of Onset  . Heart disease Father   . Colon cancer      uncle   Allergies  Allergen Reactions  . Shrimp [Shellfish Allergy] Anaphylaxis  . Contrast Media [Iodinated Diagnostic Agents] Other (See Comments)    Skin reaction-not sure exactly-per MD  . Morphine Hives  . Oxycodone-Acetaminophen Nausea And Vomiting  . Penicillins Swelling and Rash   Prior to Admission medications   Medication Sig Start Date End Date Taking? Authorizing Provider  ALPRAZolam Duanne Moron) 0.5 MG tablet Take 1 tablet (0.5 mg total) by mouth 3 (three) times daily as needed for anxiety. 06/23/13  Yes Geradine Girt, DO  Ascorbic Acid (VITAMIN C) 1000 MG tablet Take 1,000 mg by mouth daily.     Yes Historical Provider, MD  aspirin 325 MG EC tablet Take 162.5 mg by mouth daily.    Yes Historical Provider, MD  B Complex Vitamins (VITAMIN B COMPLEX) TABS Take 1 tablet by mouth daily.   Yes Historical Provider, MD  budesonide-formoterol (SYMBICORT) 160-4.5 MCG/ACT inhaler Inhale 2 puffs into the lungs 2 (two) times daily. 08/01/13  Yes Kathee Delton, MD  chlorthalidone (HYGROTON) 25 MG tablet Take 0.5 tablets (12.5 mg total) by mouth daily.   Yes Orma Flaming, MD  cholecalciferol (VITAMIN D) 1000 UNITS tablet Take 2,000 Units by mouth daily.    Yes Historical Provider, MD  fluticasone (FLONASE) 50 MCG/ACT nasal spray Place 2 sprays into the nose daily. 06/01/12  Yes Orma Flaming, MD  gabapentin (NEURONTIN) 300 MG capsule Take 900 mg by mouth daily.   Yes Historical Provider, MD  Gabapentin, PHN, 600 MG TABS Take 1 tablet by mouth 2 (two) times daily. 07/29/13  Yes Philmore Pali, NP  HYDROcodone-acetaminophen (HYCET) 7.5-325 mg/15 ml solution Take 5 mLs by mouth every 6 (six) hours as  needed (for cough).    Yes Historical Provider, MD  lidocaine (LIDODERM) 5 % APPLY ONE-HALF (1/2) PATCH TO EACH FOOT AND ONE-HALF (1/2) PATCH TO 1 SHOULDER IN THE EVENING 10/18/13  Yes Penni Bombard, MD  Multiple Vitamins-Minerals (ONE-A-DAY 50 PLUS PO) Take 1 tablet by mouth daily.   Yes Historical Provider, MD  Multiple Vitamins-Minerals (PRESERVISION AREDS 2) CAPS Take 1 capsule by mouth 2 (two) times daily.   Yes Historical Provider, MD  PROAIR HFA 108 (90 BASE) MCG/ACT inhaler INHALE 2 PUFFS INTO THE LUNGS EVERY 4 HOURS AS NEEDED FOR WHEEZING   Yes Orma Flaming, MD  verapamil (CALAN-SR) 120 MG CR tablet Take 120 mg by mouth daily.   Yes Historical Provider, MD  lidocaine (LIDODERM) 5 % Place 0.5 patches onto the skin daily. Remove & Discard patch within 12 hours or as directed by MD    Historical Provider, MD  NONFORMULARY OR COMPOUNDED ITEM Compounded Pain Cream.  Active Ingredients: Ketamine 10%, Baclofen 2%, Cyclobenzaprine 2%, Diclofenac 3%, Gabapentin 6%, Bupivacaine 2%.   Dispense 240 gm. Apply 1-2 grams topically to lower legs and feet 3 times a day prn. 07/29/13   Philmore Pali, NP     ROS: The patient denies fevers, chills, night sweats, unintentional weight loss, chest pain, palpitations, wheezing, dyspnea on exertion, nausea, vomiting, abdominal pain, dysuria, hematuria, melena, numbness, weakness, or tingling.   All other systems have been reviewed and were otherwise negative with the exception of those mentioned in the HPI and as above.    PHYSICAL EXAM: Filed Vitals:   11/02/13 0825  BP: 136/88  Pulse: 72  Temp: 98 F (36.7 C)  Resp: 18   Filed Vitals:   11/02/13 0825  Height: 5\' 10"  (1.778 m)  Weight: 242 lb 12.8 oz (110.133 kg)   Body mass index is 34.84 kg/(m^2).  General: Alert, no acute distress HEENT:  Normocephalic, atraumatic, oropharynx patent. EOMI Cardiovascular:  Regular rate and rhythm, no rubs murmurs or gallops.  Radial pulse intact. No pedal  edema.  Respiratory: Clear to auscultation bilaterally.  No wheezes, rales, or rhonchi.  No cyanosis, no use of accessory musculature GI: No organomegaly, abdomen is soft and non-tender, positive bowel sounds.  No masses. Skin: + dime size left index finger pad laceration, bleeding, no appreciable neurovasc compromises, 5/5 strength, sensation intact Neurologic: Facial musculature symmetric. Psychiatric: Patient is appropriate throughout our interaction. Musculoskeletal: Gait intact.  LABS: Results for orders placed in visit on 07/08/13  PULMONARY FUNCTION TEST      Result Value Ref Range   FVC-Pre 3.44     FVC-%Pred-Pre 81     FVC-Post 3.65     FVC-%Pred-Post 86     FVC-%Change-Post 5     FEV1-Pre 2.47     FEV1-%Pred-Pre 80     FEV1-Post 2.84     FEV1-%Pred-Post 92     FEV1-%Change-Post 14     FEV6-Pre 3.38     FEV6-%Pred-Pre 85     FEV6-Post 3.65     FEV6-%Pred-Post 92     FEV6-%Change-Post 8     Pre FEV1/FVC ratio 72     FEV1FVC-%Pred-Pre 98     Post FEV1/FVC ratio 78     FEV1FVC-%Change-Post 8     Pre FEV6/FVC Ratio 98     FEV6FVC-%Pred-Pre 104     Post FEV6/FVC ratio 100     FEV6FVC-%Pred-Post 106     FEV6FVC-%Change-Post 1     FEF 25-75 Pre 1.78     FEF2575-%Pred-Pre 76     FEF 25-75 Post 2.79     FEF2575-%Pred-Post 120     FEF2575-%Change-Post 56     RV 3.44     RV % pred 142     TLC 6.89     TLC % pred 100     DLCO unc 30.57     DLCO unc % pred 98     DL/VA 5.56     DL/VA % pred 122       EKG/XRAY:   Primary read interpreted by Dr. Marin Comment at [facility].   ASSESSMENT/PLAN: Encounter Diagnoses  Name Primary?  . Finger laceration, initial encounter Yes  . Need for prophylactic vaccination with combined diphtheria-tetanus-pertussis (DTP) vaccine   . Flu vaccine need    Pleasant right hand dominant 74 y/o male with left index finger pad laceration which occurred about 2-3 hrs ago occurred while shucking corn. He has a wonderful garden adn grows corn among  other things. He came in due to persistent  bleeding without any other neurovascular sxs. Not on blood thinners Flu vaccine given and Tetanus given today since not UTD on both Finger xeroform was placed on finger, we did not have any sterile foil, for index finger pad laceration  Gross sideeffects, risk and benefits, and alternatives of medications d/w patient. Patient is aware that all medications have potential sideeffects and we are unable to predict every sideeffect or drug-drug interaction that may occur.  , [place], DO 11/02/2013 8:52 AM

## 2013-11-02 NOTE — Patient Instructions (Signed)
WOUND CARE . Keep area clean and dry for 24 hours. Do not remove bandage, if applied. . After 24 hours, remove bandage and wash wound gently with mild soap and warm water. Leave the Vaseline  gauze in place, but don't worry if it falls off.Reapply a new bandage after cleaning wound, if directed. . Continue daily cleansing with soap and water until healed. . Notify the office if you experience any of the following signs of infection: Swelling, redness, pus drainage, streaking, fever >101.0 F . Notify the office if you experience excessive bleeding that does not stop after 15-20 minutes of constant, firm pressure.

## 2013-11-03 ENCOUNTER — Ambulatory Visit: Payer: Medicare Other

## 2013-11-09 ENCOUNTER — Encounter: Payer: Medicare Other | Admitting: Gastroenterology

## 2013-11-16 ENCOUNTER — Other Ambulatory Visit: Payer: Self-pay | Admitting: Cardiology

## 2013-11-23 ENCOUNTER — Encounter: Payer: Self-pay | Admitting: Gastroenterology

## 2013-12-22 ENCOUNTER — Other Ambulatory Visit: Payer: Self-pay | Admitting: Pulmonary Disease

## 2013-12-26 DIAGNOSIS — N401 Enlarged prostate with lower urinary tract symptoms: Secondary | ICD-10-CM | POA: Diagnosis not present

## 2013-12-26 DIAGNOSIS — R972 Elevated prostate specific antigen [PSA]: Secondary | ICD-10-CM | POA: Diagnosis not present

## 2013-12-26 DIAGNOSIS — N4340 Spermatocele of epididymis, unspecified: Secondary | ICD-10-CM | POA: Diagnosis not present

## 2014-01-19 ENCOUNTER — Other Ambulatory Visit: Payer: Medicare Other

## 2014-01-19 ENCOUNTER — Encounter: Payer: Self-pay | Admitting: Cardiology

## 2014-01-19 ENCOUNTER — Ambulatory Visit (INDEPENDENT_AMBULATORY_CARE_PROVIDER_SITE_OTHER): Payer: Medicare Other | Admitting: Cardiology

## 2014-01-19 VITALS — BP 150/88 | HR 73 | Ht 70.0 in | Wt 245.8 lb

## 2014-01-19 DIAGNOSIS — E785 Hyperlipidemia, unspecified: Secondary | ICD-10-CM

## 2014-01-19 DIAGNOSIS — I429 Cardiomyopathy, unspecified: Secondary | ICD-10-CM | POA: Diagnosis not present

## 2014-01-19 DIAGNOSIS — I10 Essential (primary) hypertension: Secondary | ICD-10-CM

## 2014-01-19 DIAGNOSIS — I428 Other cardiomyopathies: Secondary | ICD-10-CM

## 2014-01-19 NOTE — Progress Notes (Signed)
Patient ID: [name] [name], male   DOB: 1940/08/26, 73 y.o.   MRN: [id number]    Patient Name: [name] [name] Date of Encounter: 01/19/2014  Primary Care Provider:  Kennon Portela, MD Primary Cardiologist:  Dorothy Spark  Problem List   Past Medical History  Diagnosis Date  . HTN (hypertension)   . PVC (premature ventricular contraction)   . History of agent Orange exposure   . Retinopathy     related to PVC's  . Dissection of carotid artery     history  . Dissection of renal artery     history  . GERD (gastroesophageal reflux disease)   . History of hepatitis 1969    [place], unknown what type  . Anal fissure   . Anxiety disorder   . Arthritis   . Asthma   . Adenomatous colon polyp   . Diverticulosis   . Gallstones   . Cardiomyopathy     EF of 40-45%   Past Surgical History  Procedure Laterality Date  . Appendectomy  1963  . Cervical fusion  1985  . Cholecystectomy  1990  . Tonsillectomy  1965  . Hernia repair    . Carotid disection    . Right renal artery disection    . Nose surgery    . Vasectomy    . Total shoulder replacement      Right    Allergies  Allergies  Allergen Reactions  . Shrimp [Shellfish Allergy] Anaphylaxis  . Contrast Media [Iodinated Diagnostic Agents] Other (See Comments)    Skin reaction-not sure exactly-per MD  . Morphine Hives  . Oxycodone-Acetaminophen Nausea And Vomiting  . Penicillins Swelling and Rash    HPI  [name] [name] is a prior patient of Dr. wall. He is coming after one year for a followup.  The patient has a history of nonischemic cardiomyopathy with ejection fraction of 40-45%, history of hypertension, history of spontaneous dissection of his carotid artery as well as his renal artery, history of symptomatic PVCs which used to cause chest pain but not palpitations. He was started on diltiazem by Dr. Caryl Comes with significant improvement of his symptoms.  The patient was seen a year ago by Dr. wall for preop  evaluation for complex right shoulder surgery. As a part of his preop evaluation for lung nodules was found in his right lung. This was followed 3 months later with mild enlargement of the nodule, and patient is currently waiting for another CT scan as a year followup. Because of this issue he's shoulder surgery has been postponed for now.  The patient denies symptoms of angina, shortness of breath, dizziness palpitations or syncope. He also denies symptoms of orthopnea paroxysmal nocturnal dyspnea or lower extremity swelling. He complains of tingling in his lower extremities consistent with neuropathy.  01/19/2014 - patient is coming after one year, he states that his lisinopril was discontinued because of severe hypotension down to 70s. He denies any chest pain, is stable dyspnea on moderate exertion. He is not extremity edema. He does feel occasional palpitations but no syncope. Has been compliant with his meds  Home Medications  Prior to Admission medications   Medication Sig Start Date End Date Taking? Authorizing Provider  albuterol (PROAIR HFA) 108 (90 BASE) MCG/ACT inhaler Inhale 2 puffs into the lungs every 4 (four) hours as needed for wheezing. 10/04/12  Yes Orma Flaming, MD  ALPRAZolam Duanne Moron) 0.25 MG tablet Take 0.25 mg by mouth 3 (three) times daily as needed.  Yes Historical Provider, MD  Ascorbic Acid (VITAMIN C) 1000 MG tablet Take 1,000 mg by mouth daily.     Yes Historical Provider, MD  aspirin 325 MG EC tablet Take 162.5 mg by mouth daily.    Yes Historical Provider, MD  celecoxib (CELEBREX) 200 MG capsule Take 200 mg by mouth every other day.  10/24/11  Yes Heather M Marte, PA-C  chlorthalidone (HYGROTON) 25 MG tablet Take 1 tablet (25 mg total) by mouth daily. 03/07/12  Yes Orma Flaming, MD  cholecalciferol (VITAMIN D) 1000 UNITS tablet Take 2,000 Units by mouth daily.    Yes Historical Provider, MD  fluticasone (FLONASE) 50 MCG/ACT nasal spray Place 2 sprays into the nose  daily. 06/01/12  Yes Orma Flaming, MD  gabapentin (NEURONTIN) 600 MG tablet Take one tab po bid and 2 tabs po at night 06/04/12  Yes Kathrynn Ducking, MD  HYDROcodone-acetaminophen (NORCO/VICODIN) 5-325 MG per tablet Take 1 tablet by mouth every 8 (eight) hours as needed. 06/01/12  Yes Orma Flaming, MD  lidocaine (LIDODERM) 5 % Apply one half patch to each foot and one half patch to one shoulder in the evening. 06/04/12  Yes Kathrynn Ducking, MD  lisinopril (PRINIVIL,ZESTRIL) 20 MG tablet Take 1 tablet (20 mg total) by mouth 2 (two) times daily. PATIENT NEEDS OFFICE VISIT FOR ADDITIONAL REFILLS - 2nd NOTICE 12/14/12  Yes Orma Flaming, MD  predniSONE (DELTASONE) 10 MG tablet Take 1 po pc bid for up to 5 days prn severe joint pain 06/01/12  Yes Orma Flaming, MD  trolamine salicylate (ASPERCREME) 10 % cream Apply 1 application topically 4 (four) times daily. For pain   Yes Historical Provider, MD  verapamil (CALAN-SR) 120 MG CR tablet Take 1 tablet (120 mg total) by mouth daily. 02/26/12  Yes Renella Cunas, MD  vitamin B-12 (CYANOCOBALAMIN) 100 MCG tablet Take 50 mcg by mouth daily.   Yes Historical Provider, MD    Family History  Family History  Problem Relation Age of Onset  . Heart disease Father   . Colon cancer      uncle    Social History  History   Social History  . Marital Status: Married    Spouse Name: Santiago Glad    Number of Children: 3  . Years of Education: 16   Occupational History  . ex marine   . retired     Equities trader   Social History Main Topics  . Smoking status: Former Smoker -- 0.50 packs/day for 15 years    Types: Cigarettes    Quit date: 02/10/1978  . Smokeless tobacco: Never Used     Comment: QUIT late 70s/early 26s  . Alcohol Use: Yes     Comment: 0-3 daily  . Drug Use: No  . Sexual Activity: Not on file   Other Topics Concern  . Not on file   Social History Narrative   Lives in [place].   Married in [place] from previous  marriage.   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 Elder Cyphers     Review of Systems, as per HPI, otherwise negative General:  No chills, fever, night sweats or weight changes.  Cardiovascular:  No chest pain, dyspnea on exertion, edema, orthopnea, palpitations, paroxysmal nocturnal dyspnea. Dermatological: No rash, lesions/masses Respiratory: No cough, dyspnea Urologic: No hematuria, dysuria Abdominal:   No nausea, vomiting, diarrhea, bright red blood per rectum,  melena, or hematemesis Neurologic:  No visual changes, wkns, changes in mental status. All other systems reviewed and are otherwise negative except as noted above.  Physical Exam 150/64mmHg, 73 BPM, W 245 lbs General: Pleasant, NAD Psych: Normal affect. Neuro: Alert and oriented X 3. Moves all extremities spontaneously. HEENT: Normal  Neck: Supple without bruits or JVD. Lungs:  Resp regular and unlabored, CTA. Heart: RRR no s3, s4, or murmurs. Abdomen: Soft, non-tender, non-distended, BS + x 4.  Extremities: No clubbing, cyanosis or edema. DP/PT/Radials 2+ and equal bilaterally.  Labs:  No results for input(s): CKTOTAL, CKMB, TROPONINI in the last 72 hours. Lab Results  Component Value Date   WBC 7.4 06/23/2013   HGB 13.4 06/23/2013   HCT 39.0 06/23/2013   MCV 90.9 06/23/2013   PLT 218 06/23/2013   No results for input(s): NA, K, CL, CO2, BUN, CREATININE, CALCIUM, PROT, BILITOT, ALKPHOS, ALT, AST, GLUCOSE in the last 168 hours.  Invalid input(s): LABALBU Lab Results  Component Value Date   CHOL 157 01/11/2013   HDL 48.20 01/11/2013   LDLCALC 98 01/11/2013   TRIG 52.0 01/11/2013   Lab Results  Component Value Date   DDIMER 0.61* 06/21/2013   Invalid input(s): POCBNP  Accessory Clinical Findings  Echocardiogram - 12/17/2010 Left ventricle: The cavity size was normal. Wall thickness was increased in a pattern of mild LVH. Systolic function was mildly to moderately  reduced. The estimated ejection fraction was in the range of 40% to 45%. Diffuse hypokinesis. Doppler parameters are consistent with abnormal left ventricular relaxation (grade 1 diastolic dysfunction). - Aortic valve: Transvalvular velocity was within the normal range. There was no stenosis. - Left atrium: The atrium was mildly dilated. - Right ventricle: The cavity size was normal. Systolic function was normal. - Right atrium: The atrium was mildly dilated. - Pulmonary arteries: PA peak pressure: 10mm Hg (S). - Inferior vena cava: The vessel was normal in size; the respirophasic diameter changes were in the normal range (= 50%); findings are consistent with normal central venous pressure. Impressions:  - Normal LV size with mild LV hypertrophy. EF 40-45% with global hypokinesis. Normal RV size and systolic function. No significant valvular abnormalities.  ECG - sinus rhythm, occasional PVCs, 76 beats per minute otherwise normal EKG.   Assessment & Plan  1. Nonischemic cardiomyopathy - ejection fraction 40-45%, patient is currently stable euvolemic, last echo done in 2012. Since then lisinopril was discontinued. We'll repeat to check systolic and diastolic function and potentially restart a ACEI or ARB for low LVEF.  2. Symptomatic PVCs - the patient is stable on oral diltiazem.  3. Hypertension - mildly elevated, patient states that ever measurement at home is in 140s to 150s range, however with Lisinopril his BP would drop to 70' so it was discontinued. Chlortalidone was added to his regimen. Echocardiogram showed with degree of LVH and diastolic function.  4. lipid profile - we will check with CMP  Followup in 1 year, meanwhile we'll perform echocardiogram and draw labs including lipids, CBC and compressive metabolic profile.    Dorothy Spark, MD, [facility] 01/19/2014, 9:56 AM

## 2014-01-19 NOTE — Patient Instructions (Signed)
Your physician recommends that you continue on your current medications as directed. Please refer to the Current Medication list given to you today.  Your physician has requested that you have an echocardiogram. Echocardiography is a painless test that uses sound waves to create images of your heart. It provides your doctor with information about the size and shape of your heart and how well your heart's chambers and valves are working. This procedure takes approximately one hour. There are no restrictions for this procedure.  Your physician recommends that you return for lab work in: today (CMET, Lipids and CBCD)  Your physician wants you to follow-up in: 1 year. You will receive a reminder letter in the mail two months in advance. If you don't receive a letter, please call our office to schedule the follow-up appointment.

## 2014-01-23 ENCOUNTER — Encounter: Payer: Self-pay | Admitting: Diagnostic Neuroimaging

## 2014-01-23 ENCOUNTER — Ambulatory Visit (INDEPENDENT_AMBULATORY_CARE_PROVIDER_SITE_OTHER): Payer: Medicare Other | Admitting: Diagnostic Neuroimaging

## 2014-01-23 VITALS — BP 161/94 | HR 69 | Temp 97.5°F | Ht 70.0 in | Wt 248.4 lb

## 2014-01-23 DIAGNOSIS — F4310 Post-traumatic stress disorder, unspecified: Secondary | ICD-10-CM

## 2014-01-23 DIAGNOSIS — R413 Other amnesia: Secondary | ICD-10-CM

## 2014-01-23 DIAGNOSIS — M5416 Radiculopathy, lumbar region: Secondary | ICD-10-CM | POA: Diagnosis not present

## 2014-01-23 NOTE — Progress Notes (Signed)
[facility]  PATIENT: [name] [name] DOB: [birth year]  REFERRING CLINICIAN:  HISTORY FROM: patient  REASON FOR VISIT: follow up   HISTORICAL  CHIEF COMPLAINT:  Chief Complaint  Patient presents with  . Follow-up    HISTORY OF PRESENT ILLNESS:   UPDATE 01/23/14 (VRP): Since last visit, having more memory problems, agitation, anxiety, and pain. Has sleep apnea dx but couldn't tolerate CPAP in the past (last tried 3 yrs ago). Also PTSD getting worse (not on meds, not getting therapy). He is requesting to taper off gabapentin, b/c his wife tells him that he had similar issues in the past when he was on gabapentin yrs ago.  He wants to go back to neuropathy cream, in spite of high cost, and inability to find lower price a local pharmacy or [place].  UPDATE 07/29/13 (LL): Since last visit, patient has had good relief with transdermal therapeutic cream. He wants to continue to use this although the price went up to $300 for 3 tubes. He would like to get it cheaper at a local pharmacy if possible. His wife was noticing more memory loss mainly short-term. Dr. Gwenette Greet recommended he decrease his dose of Gralise by half. While he notices that this helped with his short-term memory he also has increased pain. Right shoulder pain continues to be treated with Lidoderm patch. Since last visit he was hospitalized with breathing problems and was diagnosed with adult-onset asthma.  UPDATE 01/14/13 (VRP): 73 year old male here for evaluation and followup of lumbar radiculopathy pain. Patient formerly evaluated by Dr. Erling Cruz. Patient is on gabapentin and Lidoderm patches for pain control. Gabapentin seems to wear off in the night. He also has tried hydrocodone to PCP with mild relief. His main complaint today is pain from his knees down to his feet. This is chronic and suspected to be related to lumbar radiculopathy. Also of note patient has history of left ICA dissection in 1994, treated  conservatively with antiplatelet therapy (aspirin). 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 Patient has history of cervical and lumbar compression fractures. Currently he is also dealing with the right shoulder pain and right lung lesion under surveillance.  PRIOR HPI (03/02/12, Dr. Erling Cruz): 73 year old right-handed white married male with a history of spontaneous dissection of his left internal carotid artery in 1996 associated with "numb tongue" syndrome and left-sided headache. He was treated with Coumadin therapy and the dissection gradually resolved. He was admitted to [facility] 10/1999 with a right kidney infarct causing right sided flank pain. 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 There was also weblike stenosis of accessory left renal artery supplying the upper pole. The stenosis was noted at the bifurcation the vessels. There was no evidence of atherosclerotic disease in the aorta or iliac arteries. The Iliac arteries were tortuous. It was felt the underlying cause of the abnormality in the right inferior lobar artery and accessory left renal artery was on the basis of fibromuscular dysplasia or intrinsic arteritis. Workup for collagen vascular disorder was negative. 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 He has a long history neck pain with C5-C6 fusion in 1984. He had his right shoulder replaced in 1997 with only fair results. He has a residual fracture near one of the screws and orthopedic consultation with Dr. Windle Guard in [place] stated it would take one and  a half years and 4 operations to treat the shoulder. He had a second opinion at the [facility] and is considering surgery. He has a history of spinal compression fractures over 35 years ago.He has a bone spur at his ankle and pain with  walking. He has right wrist arthritis. He has pain in the back of his neck soreness to touch and pain in his right shoulder all the time though there is some pain in both shoulders. He has tingling in the thumb,index, and third finger of his right hand and rarely fourth and fifth finger. He as similar symptoms in his left hand but not as severe. EMG/NCV 10/15/10 showed normal motor conduction velocity of the upper extremities and mild chronic stable changes of a right C6 radiculopathy. He has awakened at night with tremor and his hand/arm shaking, as often as 3 times per week. He denies Lhermitte's sign or bowel or bladder dysfunction. The numbness in his hand comes and goes. I reviewed the MRI study of the cervical spine 07/23/2010 showing solid interbody fusion at C5-6, DJD and spondylosis and C6-7 causing stenosis and mild foraminal narrowing bilaterally. Mild DJD at C4- 5 with what I think is significant facet disease at C4-5 on the right. he continues to have pain. This affects his ability to walk. He has a handicapped bathroom in his home and has built a ramp. He takes hydrocodone-acetaminophen 5-325 approximately 5 a week. He is followed by Dr. Lawerance Bach urologist for the question of prostate cancer.PSA 08/29/2011 was 4.2. He underwent CT scan of his right shoulder and an asymptomatic "spot" on the right lung was found. He is having a followup CT scan 2014 before considering surgery to his right shoulder at the [facility]. He denies shortness of breath, chest pain, hemoptysis, or weight loss He complains of numbness in both hands and both feet, right hand greater than left predominantly in the thumb index and third finger. He has burning in both feet.This can awaken him at night with burning. He is using Lidoderm patches, a half on each foot and a half on the right shoulder. He describes fingertips turning white and then blue.Blood studies 08/29/2011 were normal CBC, CMP except total  bilirubin 1.6, TSH,and B12.I note he has high blood sugars in some of Dr. Luiz Ochoa notes which raises the question of diabetes. He may need hemoglobin A1c  REVIEW OF SYSTEMS: Full 14 system review of systems performed and notable only for increased weight hearing loss ringing in ears shortness of breath wheezing loss of vision in right eye from macular degeneration excessive eating environmental and food allergies easy bruising agitation confusion anxiety memory loss urinary urgency joint pain walking difficulty neck pain joint swelling insomnia apnea frequent waking daytime sleepiness.  ALLERGIES: Allergies  Allergen Reactions  . Shrimp [Shellfish Allergy] Anaphylaxis  . Contrast Media [Iodinated Diagnostic Agents] Other (See Comments)    Skin reaction-not sure exactly-per MD  . Morphine Hives  . Oxycodone-Acetaminophen Nausea And Vomiting  . Penicillins Swelling and Rash    HOME MEDICATIONS: Outpatient Prescriptions Prior to Visit  Medication Sig Dispense Refill  . Ascorbic Acid (VITAMIN C) 1000 MG tablet Take 1,000 mg by mouth daily.      Marland Kitchen aspirin 325 MG EC tablet Take 162.5 mg by mouth daily.     . B Complex Vitamins (VITAMIN B COMPLEX) TABS Take 1 tablet by mouth daily.    . chlorthalidone (HYGROTON) 25 MG tablet Take 0.5 tablets (12.5 mg total)  by mouth daily. 45 tablet 3  . cholecalciferol (VITAMIN D) 1000 UNITS tablet Take 2,000 Units by mouth daily.     . fluticasone (FLONASE) 50 MCG/ACT nasal spray Place 2 sprays into the nose daily. 16 g 11  . Gabapentin, Once-Daily, (GRALISE) 600 MG TABS Take 900 mg by mouth daily. Pt takes one half tab (300mg ) in the morning and one tab (600mg ) in the evening. Total 900mg  daily.    Marland Kitchen lidocaine (LIDODERM) 5 % Place 0.5 patches onto the skin daily. Remove & Discard patch within 12 hours or as directed by MD    . lidocaine (LIDODERM) 5 % APPLY ONE-HALF (1/2) PATCH TO EACH FOOT AND ONE-HALF (1/2) PATCH TO 1 SHOULDER IN THE EVENING 135 patch 1  .  Multiple Vitamins-Minerals (ONE-A-DAY 50 PLUS PO) Take 1 tablet by mouth daily.    . Multiple Vitamins-Minerals (PRESERVISION AREDS 2) CAPS Take 1 capsule by mouth 2 (two) times daily.    . NONFORMULARY OR COMPOUNDED ITEM Compounded Pain Cream.  Active Ingredients: Ketamine 10%, Baclofen 2%, Cyclobenzaprine 2%, Diclofenac 3%, Gabapentin 6%, Bupivacaine 2%.   Dispense 240 gm. Apply 1-2 grams topically to lower legs and feet 3 times a day prn. 3 each 5  . PROAIR HFA 108 (90 BASE) MCG/ACT inhaler INHALE 2 PUFFS INTO THE LUNGS EVERY 4 HOURS AS NEEDED FOR WHEEZING 3 each 1  . SYMBICORT 160-4.5 MCG/ACT inhaler USE 2 INHALATIONS TWICE A DAY 3 Inhaler 3  . verapamil (CALAN-SR) 120 MG CR tablet TAKE 1 TABLET DAILY 90 tablet 1  . ALPRAZolam (XANAX) 0.5 MG tablet Take 1 tablet (0.5 mg total) by mouth 3 (three) times daily as needed for anxiety. (Patient not taking: Reported on 01/23/2014) 10 tablet 0  . HYDROcodone-acetaminophen (HYCET) 7.5-325 mg/15 ml solution Take 5 mLs by mouth every 6 (six) hours as needed (for cough).      No facility-administered medications prior to visit.    PAST MEDICAL HISTORY: Past Medical History  Diagnosis Date  . HTN (hypertension)   . PVC (premature ventricular contraction)   . History of agent Orange exposure   . Retinopathy     related to PVC's  . Dissection of carotid artery     history  . Dissection of renal artery     history  . GERD (gastroesophageal reflux disease)   . History of hepatitis 1969    [place], unknown what type  . Anal fissure   . Anxiety disorder   . Arthritis   . Asthma   . Adenomatous colon polyp   . Diverticulosis   . Gallstones   . Cardiomyopathy     EF of 40-45%    PAST SURGICAL HISTORY: Past Surgical History  Procedure Laterality Date  . Appendectomy  1963  . Cervical fusion  1985  . Cholecystectomy  1990  . Tonsillectomy  1965  . Hernia repair    . Carotid disection    . Right renal artery disection    . Nose  surgery    . Vasectomy    . Total shoulder replacement      Right    FAMILY HISTORY: Family History  Problem Relation Age of Onset  . Heart disease Father   . Colon cancer      uncle    SOCIAL HISTORY:  History   Social History  . Marital Status: Married    Spouse Name: Santiago Glad    Number of Children: 3  . Years of Education: 16   Occupational  History  . ex marine   . retired     Equities trader   Social History Main Topics  . Smoking status: Former Smoker -- 0.50 packs/day for 15 years    Types: Cigarettes    Quit date: 02/10/1978  . Smokeless tobacco: Never Used     Comment: QUIT late 70s/early 79s  . Alcohol Use: Yes     Comment: 0-3 daily  . Drug Use: No  . Sexual Activity: Not on file   Other Topics Concern  . Not on file   Social History Narrative   Lives in [place].   Married in [place] from previous marriage.   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 Elder Cyphers     PHYSICAL EXAM  Filed Vitals:   01/23/14 0834  BP: 161/94  Pulse: 69  Temp: 97.5 F (36.4 C)  TempSrc: Oral  Height: 5\' 10"  (1.778 m)  Weight: 248 lb 6.4 oz (112.674 kg)    Body mass index is 35.64 kg/(m^2).  No exam data present  No flowsheet data found.  GENERAL EXAM: Patient is in no distress; well developed, nourished and groomed; neck is supple  CARDIOVASCULAR: Regular rate and rhythm, no murmurs, no carotid bruits  NEUROLOGIC: MENTAL STATUS: awake, alert, language fluent, comprehension intact, naming intact, fund of knowledge appropriate; SOME FLAT/DEPRESSED AFFECT CRANIAL NERVE: pupils equal and reactive to light, DECR VISION IN RIGHT EYE, visual fields full to confrontation, extraocular muscles intact, no nystagmus, facial sensation and strength symmetric, hearing intact, palate elevates symmetrically, uvula midline, shoulder shrug symmetric, tongue midline. MOTOR: normal bulk and tone, full strength in the BUE,  BLE; EXCEPT DECR IN RUE DUE TO RIGHT SHOULD PAIN SENSORY: normal and symmetric to light touch; DECR VIB AT TOES COORDINATION: finger-nose-finger, fine finger movements normal REFLEXES: BUE 1, BLE 0 GAIT/STATION: narrow based gait; ANTALGIC GAIT, USES SINGLE POINT CANE     DIAGNOSTIC DATA (LABS, IMAGING, TESTING) - I reviewed patient records, labs, notes, testing and imaging myself where available.  Lab Results  Component Value Date   WBC 7.4 06/23/2013   HGB 13.4 06/23/2013   HCT 39.0 06/23/2013   MCV 90.9 06/23/2013   PLT 218 06/23/2013      Component Value Date/Time   NA 145 06/23/2013 0316   K 3.0* 06/23/2013 0316   CL 104 06/23/2013 0316   CO2 28 06/23/2013 0316   GLUCOSE 112* 06/23/2013 0316   BUN 18 06/23/2013 0316   CREATININE 0.93 06/23/2013 0316   CREATININE 0.89 06/09/2013 1604   CALCIUM 9.1 06/23/2013 0316   PROT 7.3 06/21/2013 1922   ALBUMIN 4.4 06/21/2013 1922   AST 35 06/21/2013 1922   ALT 28 06/21/2013 1922   ALKPHOS 72 06/21/2013 1922   BILITOT 2.2* 06/21/2013 1922   GFRNONAA 83* 06/23/2013 0316   GFRAA >90 06/23/2013 0316   Lab Results  Component Value Date   CHOL 157 01/11/2013   HDL 48.20 01/11/2013   LDLCALC 98 01/11/2013   TRIG 52.0 01/11/2013   CHOLHDL 3 01/11/2013   Lab Results  Component Value Date   HGBA1C 5.3 03/07/2012   Lab Results  Component Value Date   YDXAJOIN86 767 08/29/2011   Lab Results  Component Value Date   TSH 0.993 02/07/2013    10/15/10 EMG/NCS ([place]) - Nerve conduction studies done on both upper extremities were unremarkable, without evidence of a neuropathy or carpal tunnel syndrome on either side. EMG  study of the right upper extremity shows mild chronic stable changes consistent with a C6 radiculopathy. EMG study of the left upper extremity was unremarkable. There is no evidence of an acute cervical radiculopathy on either side.      ASSESSMENT AND PLAN  73 y.o. year old male here with:  - Peripheral  neuropathy (? paraneoplastic from lung lesion) vs lumbar radiculopathy - Right shoulder pain - Right wrist pain - Fibromuscular dysplasia with history of dissection of the left internal carotid artery and right renal artery - Status post cervical fusion - Cardiomyopathy   PLAN: - Taper gabapentin off; reduce by half tab every 1-2 weeks until stopped; to see if memory and mood improve - Try neuropathy cream again; he is willing to pay cash b/c insurance won't cover it anymore - Follow up re: PTSD and sleep apnea. - Consider cymbalta in future.  Return in about 4 months (around 05/25/2014).    Penni Bombard, MD 02/72/5366, 4:40 AM Certified in Neurology, Neurophysiology and Neuroimaging  [facility] [address] [place], [place] [address] [phone]

## 2014-01-23 NOTE — Patient Instructions (Signed)
Taper gabapentin off; reduce by half tab every 1-2 weeks until stopped.  Try neuropathy cream again.  Follow up re: PTSD and sleep apnea.  Consider cymbalta in future.

## 2014-01-25 ENCOUNTER — Telehealth: Payer: Self-pay | Admitting: General Practice

## 2014-01-25 ENCOUNTER — Ambulatory Visit (HOSPITAL_COMMUNITY): Payer: Medicare Other | Attending: Cardiology

## 2014-01-25 DIAGNOSIS — I429 Cardiomyopathy, unspecified: Secondary | ICD-10-CM | POA: Insufficient documentation

## 2014-01-25 DIAGNOSIS — F419 Anxiety disorder, unspecified: Secondary | ICD-10-CM | POA: Insufficient documentation

## 2014-01-25 DIAGNOSIS — I351 Nonrheumatic aortic (valve) insufficiency: Secondary | ICD-10-CM | POA: Insufficient documentation

## 2014-01-25 DIAGNOSIS — J45909 Unspecified asthma, uncomplicated: Secondary | ICD-10-CM | POA: Insufficient documentation

## 2014-01-25 DIAGNOSIS — R911 Solitary pulmonary nodule: Secondary | ICD-10-CM | POA: Insufficient documentation

## 2014-01-25 DIAGNOSIS — G4733 Obstructive sleep apnea (adult) (pediatric): Secondary | ICD-10-CM | POA: Diagnosis not present

## 2014-01-25 DIAGNOSIS — Z87891 Personal history of nicotine dependence: Secondary | ICD-10-CM | POA: Insufficient documentation

## 2014-01-25 DIAGNOSIS — I493 Ventricular premature depolarization: Secondary | ICD-10-CM | POA: Insufficient documentation

## 2014-01-25 DIAGNOSIS — R55 Syncope and collapse: Secondary | ICD-10-CM | POA: Insufficient documentation

## 2014-01-25 DIAGNOSIS — K219 Gastro-esophageal reflux disease without esophagitis: Secondary | ICD-10-CM | POA: Diagnosis not present

## 2014-01-25 DIAGNOSIS — R1310 Dysphagia, unspecified: Secondary | ICD-10-CM | POA: Insufficient documentation

## 2014-01-25 DIAGNOSIS — I10 Essential (primary) hypertension: Secondary | ICD-10-CM | POA: Insufficient documentation

## 2014-01-25 NOTE — Telephone Encounter (Signed)
LMTCB

## 2014-01-25 NOTE — Telephone Encounter (Signed)
New Message     Patient returning your call and would like a call back.

## 2014-01-25 NOTE — Telephone Encounter (Signed)
F/U   Pt returning call from [place]. Please call back.

## 2014-01-25 NOTE — Progress Notes (Addendum)
2D Echo completed. Patient refused the use of Deifinity to better define the LV wall segments. 01/25/2014

## 2014-01-26 NOTE — Telephone Encounter (Signed)
Pt states he thought he was scheduled for lab yesterday when he was here for his echo.  Pt has not had lab ordered by Dr Meda Coffee 01/19/14. Pt scheduled for lab 01/27/14 at his request.

## 2014-01-26 NOTE — Telephone Encounter (Signed)
Pt had question about having lab done when he was here for echo 01/25/14. I was asked to call pt about this.  LMTCB for pt.

## 2014-01-27 ENCOUNTER — Telehealth: Payer: Self-pay | Admitting: *Deleted

## 2014-01-27 ENCOUNTER — Other Ambulatory Visit (INDEPENDENT_AMBULATORY_CARE_PROVIDER_SITE_OTHER): Payer: Medicare Other | Admitting: *Deleted

## 2014-01-27 DIAGNOSIS — I10 Essential (primary) hypertension: Secondary | ICD-10-CM

## 2014-01-27 DIAGNOSIS — E785 Hyperlipidemia, unspecified: Secondary | ICD-10-CM | POA: Diagnosis not present

## 2014-01-27 DIAGNOSIS — I519 Heart disease, unspecified: Secondary | ICD-10-CM

## 2014-01-27 DIAGNOSIS — I429 Cardiomyopathy, unspecified: Secondary | ICD-10-CM | POA: Diagnosis not present

## 2014-01-27 LAB — LIPID PANEL
Cholesterol: 157 mg/dL (ref 0–200)
HDL: 44 mg/dL (ref >39.00)
LDL Cholesterol: 89 mg/dL (ref 0–99)
NonHDL: 113
Total CHOL/HDL Ratio: 4
Triglycerides: 121 mg/dL (ref 0.0–149.0)
VLDL: 24.2 mg/dL (ref 0.0–40.0)

## 2014-01-27 LAB — COMPREHENSIVE METABOLIC PANEL
ALT: 20 U/L (ref 0–53)
AST: 24 U/L (ref 0–37)
Albumin: 4.3 g/dL (ref 3.5–5.2)
Alkaline Phosphatase: 56 U/L (ref 39–117)
BUN: 20 mg/dL (ref 6–23)
CO2: 26 mEq/L (ref 19–32)
Calcium: 9 mg/dL (ref 8.4–10.5)
Chloride: 103 mEq/L (ref 96–112)
Creatinine, Ser: 0.8 mg/dL (ref 0.4–1.5)
GFR: 98.2 mL/min (ref >60.00)
Glucose, Bld: 96 mg/dL (ref 70–99)
Potassium: 3.3 mEq/L — ABNORMAL LOW (ref 3.5–5.1)
Sodium: 139 mEq/L (ref 135–145)
Total Bilirubin: 1.3 mg/dL — ABNORMAL HIGH (ref 0.2–1.2)
Total Protein: 6.9 g/dL (ref 6.0–8.3)

## 2014-01-27 LAB — CBC WITH DIFFERENTIAL/PLATELET
Basophils Absolute: 0 10*3/uL (ref 0.0–0.1)
Basophils Relative: 0.6 % (ref 0.0–3.0)
Eosinophils Absolute: 0.2 10*3/uL (ref 0.0–0.7)
Eosinophils Relative: 3.8 % (ref 0.0–5.0)
HCT: 46.1 % (ref 39.0–52.0)
Hemoglobin: 15.4 g/dL (ref 13.0–17.0)
Lymphocytes Relative: 27.7 % (ref 12.0–46.0)
Lymphs Abs: 1.7 10*3/uL (ref 0.7–4.0)
MCHC: 33.3 g/dL (ref 30.0–36.0)
MCV: 91.2 fl (ref 78.0–100.0)
Monocytes Absolute: 0.6 10*3/uL (ref 0.1–1.0)
Monocytes Relative: 10 % (ref 3.0–12.0)
Neutro Abs: 3.5 10*3/uL (ref 1.4–7.7)
Neutrophils Relative %: 57.9 % (ref 43.0–77.0)
Platelets: 196 10*3/uL (ref 150.0–400.0)
RBC: 5.06 Mil/uL (ref 4.22–5.81)
RDW: 13.4 % (ref 11.5–15.5)
WBC: 6.1 10*3/uL (ref 4.0–10.5)

## 2014-01-27 MED ORDER — LOSARTAN POTASSIUM 25 MG PO TABS: 25.0000 mg | ORAL_TABLET | Freq: Every day | ORAL | Status: DC

## 2014-01-27 NOTE — Telephone Encounter (Signed)
-----   Message from Dorothy Spark, MD sent at 01/27/2014 12:17 PM EST ----- The patient has mildly decreased left ventricular ejection fraction in the range 45-50%. We will start him on losartan 25 mg daily and follow him in 2 months. He will need BMP prior to that visit.

## 2014-01-27 NOTE — Telephone Encounter (Signed)
Informed the pt that per Dr Meda Coffee he has mildly decreased LVEF of 45-50 % and he needs to start taking Losartan 25 mg po daily.  Also informed the pt that per Dr Meda Coffee he needs to come in for an OV in 2 months and have a BMP prior to that visit.  Scheduled the pts lab for 03/23/14 and informed him that my scheduler will contact him to schedule his OV for 2 months.  Confirmed the pharmacy of choice with the pt.  Pt verbalized understanding and agrees with this plan.

## 2014-02-05 ENCOUNTER — Telehealth: Payer: Self-pay | Admitting: Diagnostic Neuroimaging

## 2014-02-07 NOTE — Telephone Encounter (Signed)
Last OV note says: PLAN: - Taper gabapentin off; reduce by half tab every 1-2 weeks until stopped; to see if memory and mood improve I called the patient, got no answer.  Left message.

## 2014-02-08 NOTE — Telephone Encounter (Signed)
I called again.  Got no answer.

## 2014-02-09 NOTE — Telephone Encounter (Signed)
Patient called and stated he's tapering off gabapentin as instructed, mood and memory has improved.  Will be off medication completley in about 6 weeks.  If additional questions please call cell # [phone].

## 2014-03-13 ENCOUNTER — Other Ambulatory Visit: Payer: Self-pay | Admitting: Physician Assistant

## 2014-03-23 ENCOUNTER — Encounter: Payer: Self-pay | Admitting: Pulmonary Disease

## 2014-03-23 ENCOUNTER — Ambulatory Visit (INDEPENDENT_AMBULATORY_CARE_PROVIDER_SITE_OTHER): Payer: Medicare Other | Admitting: Pulmonary Disease

## 2014-03-23 ENCOUNTER — Other Ambulatory Visit (INDEPENDENT_AMBULATORY_CARE_PROVIDER_SITE_OTHER): Payer: Medicare Other | Admitting: *Deleted

## 2014-03-23 VITALS — BP 130/86 | HR 70 | Temp 97.9°F | Ht 70.0 in | Wt 250.0 lb

## 2014-03-23 DIAGNOSIS — J45909 Unspecified asthma, uncomplicated: Secondary | ICD-10-CM | POA: Diagnosis not present

## 2014-03-23 DIAGNOSIS — I10 Essential (primary) hypertension: Secondary | ICD-10-CM

## 2014-03-23 DIAGNOSIS — I519 Heart disease, unspecified: Secondary | ICD-10-CM

## 2014-03-23 DIAGNOSIS — I5189 Other ill-defined heart diseases: Secondary | ICD-10-CM

## 2014-03-23 LAB — BASIC METABOLIC PANEL
BUN: 15 mg/dL (ref 6–23)
CO2: 29 mEq/L (ref 19–32)
Calcium: 9 mg/dL (ref 8.4–10.5)
Chloride: 105 mEq/L (ref 96–112)
Creatinine, Ser: 0.82 mg/dL (ref 0.40–1.50)
GFR: 98.16 mL/min (ref >60.00)
Glucose, Bld: 102 mg/dL — ABNORMAL HIGH (ref 70–99)
Potassium: 4 mEq/L (ref 3.5–5.1)
Sodium: 139 mEq/L (ref 135–145)

## 2014-03-23 NOTE — Patient Instructions (Signed)
No change in breathing medications. Work on weight reduction and conditioning. followup with me again in 8mos, but call if having issues.

## 2014-03-23 NOTE — Progress Notes (Signed)
   Subjective:    Patient ID: [name] [name], male    DOB: 1940/07/07, 74 y.o.   MRN: [id number]  HPI Patient comes in today for follow-up of his known asthma. He has not had an acute exacerbation since last visit, and continues to stay on Symbicort. He will periodically have audible wheezing, especially at night, and I've explained to him this is upper airway rather than lower. He has no symptoms of shortness of breath or chest tightness with the audible wheezing. He has not had to increase the frequency of his rescue inhaler.   Review of Systems  Constitutional: Negative for fever and unexpected weight change.  HENT: Negative for congestion, dental problem, ear pain, nosebleeds, postnasal drip, rhinorrhea, sinus pressure, sneezing, sore throat and trouble swallowing.   Eyes: Negative for redness and itching.  Respiratory: Positive for wheezing. Negative for cough, chest tightness and shortness of breath.   Cardiovascular: Negative for palpitations and leg swelling.  Gastrointestinal: Negative for nausea and vomiting.  Genitourinary: Negative for dysuria.  Musculoskeletal: Negative for joint swelling.  Skin: Negative for rash.  Neurological: Negative for headaches.  Hematological: Does not bruise/bleed easily.  Psychiatric/Behavioral: Negative for dysphoric mood. The patient is not nervous/anxious.        Objective:   Physical Exam Obese male in no acute distress Nose without purulence or discharge noted Neck without lymphadenopathy or thyromegaly Chest totally clear to auscultation with some mild upper airway noise Cardiac exam with regular rate and rhythm Lower extremities without edema, no cyanosis Alert and oriented, moves all 4 extremities.       Assessment & Plan:

## 2014-03-24 ENCOUNTER — Telehealth: Payer: Self-pay | Admitting: Pulmonary Disease

## 2014-03-24 NOTE — Telephone Encounter (Signed)
I called spoke with pt. He reports he takes budesonide. He has read should not take this if you have eye conditions. Pt reports he has macular degen, HTN (under control), takes chlorothiazide 12.5 mg QD (which he takes).  Pt also reports common side effects are joint pain (which he has severe joint pain), low blood levels of potassium.  Pt also reports he is not sure if the budesonide is causing him to bruise easy on forearms. He wants to know if this still should be continued? Please advise thanks

## 2014-03-24 NOTE — Telephone Encounter (Signed)
Called made pt aware. Nothing further needed 

## 2014-03-24 NOTE — Telephone Encounter (Signed)
He takes symbicort The inhaled steroid in this has a small increased incidence of cataracts long term, not other eye diseases I have never seen or heard of joint pain with inhaled steroids.   The long acting albuterol in symbicort under RARE circumstances can decrease potassium levels.  Not common Inhaled steroids do not cause easy bruisability because not absorbed into bloodstream to any signficant level

## 2014-04-05 ENCOUNTER — Ambulatory Visit (INDEPENDENT_AMBULATORY_CARE_PROVIDER_SITE_OTHER): Payer: Medicare Other | Admitting: Cardiology

## 2014-04-05 ENCOUNTER — Encounter: Payer: Self-pay | Admitting: Cardiology

## 2014-04-05 VITALS — BP 122/82 | HR 79 | Ht 70.0 in | Wt 244.2 lb

## 2014-04-05 DIAGNOSIS — I10 Essential (primary) hypertension: Secondary | ICD-10-CM | POA: Diagnosis not present

## 2014-04-05 DIAGNOSIS — I420 Dilated cardiomyopathy: Secondary | ICD-10-CM

## 2014-04-05 DIAGNOSIS — I429 Cardiomyopathy, unspecified: Secondary | ICD-10-CM | POA: Diagnosis not present

## 2014-04-05 MED ORDER — LOSARTAN POTASSIUM 25 MG PO TABS: 25.0000 mg | ORAL_TABLET | Freq: Every day | ORAL | Status: DC

## 2014-04-05 NOTE — Progress Notes (Signed)
Patient ID: [name] [name], male   DOB: 23-Jul-1940, 74 y.o.   MRN: [id number]    Patient Name: [name] [name] Date of Encounter: 04/05/2014  Primary Care Provider:  Kennon Portela, MD Primary Cardiologist:  Dorothy Spark  Problem List   Past Medical History  Diagnosis Date  . HTN (hypertension)   . PVC (premature ventricular contraction)   . History of agent Orange exposure   . Retinopathy     related to PVC's  . Dissection of carotid artery     history  . Dissection of renal artery     history  . GERD (gastroesophageal reflux disease)   . History of hepatitis 1969    [place], unknown what type  . Anal fissure   . Anxiety disorder   . Arthritis   . Asthma   . Adenomatous colon polyp   . Diverticulosis   . Gallstones   . Cardiomyopathy     EF of 40-45%   Past Surgical History  Procedure Laterality Date  . Appendectomy  1963  . Cervical fusion  1985  . Cholecystectomy  1990  . Tonsillectomy  1965  . Hernia repair    . Carotid disection    . Right renal artery disection    . Nose surgery    . Vasectomy    . Total shoulder replacement      Right    Allergies  Allergies  Allergen Reactions  . Shrimp [Shellfish Allergy] Anaphylaxis  . Contrast Media [Iodinated Diagnostic Agents] Other (See Comments)    Skin reaction-not sure exactly-per MD  . Morphine Hives  . Oxycodone-Acetaminophen Nausea And Vomiting  . Penicillins Swelling and Rash    HPI  [name] [name] is a prior patient of Dr. wall. He is coming after one year for a followup.  The patient has a history of nonischemic cardiomyopathy with ejection fraction of 40-45%, history of hypertension, history of spontaneous dissection of his carotid artery as well as his renal artery, history of symptomatic PVCs which used to cause chest pain but not palpitations. He was started on diltiazem by Dr. Caryl Comes with significant improvement of his symptoms.  The patient was seen a year ago by Dr. wall for preop  evaluation for complex right shoulder surgery. As a part of his preop evaluation for lung nodules was found in his right lung. This was followed 3 months later with mild enlargement of the nodule, and patient is currently waiting for another CT scan as a year followup. Because of this issue he's shoulder surgery has been postponed for now.  The patient denies symptoms of angina, shortness of breath, dizziness palpitations or syncope. He also denies symptoms of orthopnea paroxysmal nocturnal dyspnea or lower extremity swelling. He complains of tingling in his lower extremities consistent with neuropathy.  01/19/2014 - patient is coming after one year, he states that his lisinopril was discontinued because of severe hypotension down to 70s. He denies any chest pain, is stable dyspnea on moderate exertion. He is not extremity edema. He does feel occasional palpitations but no syncope. Has been compliant with his meds.  04/05/2014 - the patient is coming after 3 months, he states that he is feeling well he has stable dyspnea on exertion that he attributes to his asthma which he takes Symbicort daily. In the meantime he was diagnosed with glaucoma and is undergoing treatment. The patient underwent transthoracic echocardiography that showed left ventricular ejection fraction of 45 to 50% and he was started on losartan 25  mg orally daily. He had follow-up labs and his creatinine and potassium remained normal.  Home Medications  Prior to Admission medications   Medication Sig Start Date End Date Taking? Authorizing Provider  albuterol (PROAIR HFA) 108 (90 BASE) MCG/ACT inhaler Inhale 2 puffs into the lungs every 4 (four) hours as needed for wheezing. 10/04/12  Yes Orma Flaming, MD  ALPRAZolam Duanne Moron) 0.25 MG tablet Take 0.25 mg by mouth 3 (three) times daily as needed.    Yes Historical Provider, MD  Ascorbic Acid (VITAMIN C) 1000 MG tablet Take 1,000 mg by mouth daily.     Yes Historical Provider, MD    aspirin 325 MG EC tablet Take 162.5 mg by mouth daily.    Yes Historical Provider, MD  celecoxib (CELEBREX) 200 MG capsule Take 200 mg by mouth every other day.  10/24/11  Yes Heather M Marte, PA-C  chlorthalidone (HYGROTON) 25 MG tablet Take 1 tablet (25 mg total) by mouth daily. 03/07/12  Yes Orma Flaming, MD  cholecalciferol (VITAMIN D) 1000 UNITS tablet Take 2,000 Units by mouth daily.    Yes Historical Provider, MD  fluticasone (FLONASE) 50 MCG/ACT nasal spray Place 2 sprays into the nose daily. 06/01/12  Yes Orma Flaming, MD  gabapentin (NEURONTIN) 600 MG tablet Take one tab po bid and 2 tabs po at night 06/04/12  Yes Kathrynn Ducking, MD  HYDROcodone-acetaminophen (NORCO/VICODIN) 5-325 MG per tablet Take 1 tablet by mouth every 8 (eight) hours as needed. 06/01/12  Yes Orma Flaming, MD  lidocaine (LIDODERM) 5 % Apply one half patch to each foot and one half patch to one shoulder in the evening. 06/04/12  Yes Kathrynn Ducking, MD  lisinopril (PRINIVIL,ZESTRIL) 20 MG tablet Take 1 tablet (20 mg total) by mouth 2 (two) times daily. PATIENT NEEDS OFFICE VISIT FOR ADDITIONAL REFILLS - 2nd NOTICE 12/14/12  Yes Orma Flaming, MD  predniSONE (DELTASONE) 10 MG tablet Take 1 po pc bid for up to 5 days prn severe joint pain 06/01/12  Yes Orma Flaming, MD  trolamine salicylate (ASPERCREME) 10 % cream Apply 1 application topically 4 (four) times daily. For pain   Yes Historical Provider, MD  verapamil (CALAN-SR) 120 MG CR tablet Take 1 tablet (120 mg total) by mouth daily. 02/26/12  Yes Renella Cunas, MD  vitamin B-12 (CYANOCOBALAMIN) 100 MCG tablet Take 50 mcg by mouth daily.   Yes Historical Provider, MD    Family History  Family History  Problem Relation Age of Onset  . Heart disease Father   . Colon cancer      uncle    Social History  History   Social History  . Marital Status: Married    Spouse Name: Santiago Glad  . Number of Children: 3  . Years of Education: 16   Occupational History  . ex  marine   . retired     Equities trader   Social History Main Topics  . Smoking status: Former Smoker -- 0.50 packs/day for 15 years    Types: Cigarettes    Quit date: 02/11/1988  . Smokeless tobacco: Never Used     Comment: QUIT late 70s/early 33s  . Alcohol Use: 0.0 oz/week    0 Standard drinks or equivalent per week     Comment: 0-3 daily  . Drug Use: No  . Sexual Activity: Not on file   Other Topics Concern  . Not on file   Social History Narrative   Lives  in [place].   Married in [place] from previous marriage.   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 Elder Cyphers     Review of Systems, as per HPI, otherwise negative General:  No chills, fever, night sweats or weight changes.  Cardiovascular:  No chest pain, dyspnea on exertion, edema, orthopnea, palpitations, paroxysmal nocturnal dyspnea. Dermatological: No rash, lesions/masses Respiratory: No cough, dyspnea Urologic: No hematuria, dysuria Abdominal:   No nausea, vomiting, diarrhea, bright red blood per rectum, melena, or hematemesis Neurologic:  No visual changes, wkns, changes in mental status. All other systems reviewed and are otherwise negative except as noted above.  Physical Exam 122/82 mmHg, 79 BPM, W 245 lbs General: Pleasant, NAD Psych: Normal affect. Neuro: Alert and oriented X 3. Moves all extremities spontaneously. HEENT: Normal  Neck: Supple without bruits or JVD. Lungs:  Resp regular and unlabored, CTA. Heart: RRR no s3, s4, or murmurs. Abdomen: Soft, non-tender, non-distended, BS + x 4.  Extremities: No clubbing, cyanosis or edema. DP/PT/Radials 2+ and equal bilaterally.  Labs:  No results for input(s): CKTOTAL, CKMB, TROPONINI in the last 72 hours. Lab Results  Component Value Date   WBC 6.1 01/27/2014   HGB 15.4 01/27/2014   HCT 46.1 01/27/2014   MCV 91.2 01/27/2014   PLT 196.0 01/27/2014   No results for input(s): NA, K, CL, CO2, BUN,  CREATININE, CALCIUM, PROT, BILITOT, ALKPHOS, ALT, AST, GLUCOSE in the last 168 hours.  Invalid input(s): LABALBU Lab Results  Component Value Date   CHOL 157 01/27/2014   HDL 44.00 01/27/2014   LDLCALC 89 01/27/2014   TRIG 121.0 01/27/2014   Lab Results  Component Value Date   DDIMER 0.61* 06/21/2013   Invalid input(s): POCBNP  Accessory Clinical Findings  Echocardiogram - 12/17/2010 Left ventricle: The cavity size was normal. Wall thickness was increased in a pattern of mild LVH. Systolic function was mildly to moderately reduced. The estimated ejection fraction was in the range of 40% to 45%. Diffuse hypokinesis. Doppler parameters are consistent with abnormal left ventricular relaxation (grade 1 diastolic dysfunction). - Aortic valve: Transvalvular velocity was within the normal range. There was no stenosis. - Left atrium: The atrium was mildly dilated. - Right ventricle: The cavity size was normal. Systolic function was normal. - Right atrium: The atrium was mildly dilated. - Pulmonary arteries: PA peak pressure: 37mm Hg (S). - Inferior vena cava: The vessel was normal in size; the respirophasic diameter changes were in the normal range (= 50%); findings are consistent with normal central venous pressure. Impressions:  - Normal LV size with mild LV hypertrophy. EF 40-45% with global hypokinesis. Normal RV size and systolic function. No significant valvular abnormalities.  Echocardogram - 01/25/2014 Left ventricle: The cavity size was normal. Wall thickness was normal. Systolic function was mildly reduced. The estimated ejection fraction was in the range of 45% to 50%. Diffuse hypokinesis. Doppler parameters are consistent with abnormal left ventricular relaxation (grade 1 diastolic dysfunction). - Aortic valve: There was trivial regurgitation. - Left atrium: The atrium was mildly dilated  ECG - sinus rhythm, occasional PVCs, 76  beats per minute otherwise normal EKG.   Assessment & Plan  1. Nonischemic cardiomyopathy - ejection fraction 45-50% started on losartan we will see patient in 6 months as he is now euvolemic. We'll repeat echocardiogram prior to next visit.  2. Symptomatic PVCs - the patient is stable on oral diltiazem.  3. Hypertension - controlled  after starting losartan.  4. lipid profile - checked in December 2015, triglyceride 121 HDL 44 LDL 89, this is at goal and be no need to start therapy.  Followup in 6 months with echo prior to the next visit.   Dorothy Spark, MD, [facility] 04/05/2014, 10:33 AM

## 2014-04-05 NOTE — Patient Instructions (Signed)
Your physician recommends that you continue on your current medications as directed. Please refer to the Current Medication list given to you today.    Your physician has requested that you have an echocardiogram. Echocardiography is a painless test that uses sound waves to create images of your heart. It provides your doctor with information about the size and shape of your heart and how well your heart's chambers and valves are working. This procedure takes approximately one hour. There are no restrictions for this procedure.  SCHEDULE THIS PRIOR TO YOUR 6 MONTH OFFICE VISIT WITH DR Meda Coffee     Your physician wants you to follow-up in: [place] will receive a reminder letter in the mail two months in advance. If you don't receive a letter, please call our office to schedule the follow-up appointment.

## 2014-04-10 ENCOUNTER — Other Ambulatory Visit: Payer: Self-pay | Admitting: Cardiology

## 2014-05-01 ENCOUNTER — Telehealth: Payer: Self-pay | Admitting: Diagnostic Neuroimaging

## 2014-05-01 NOTE — Telephone Encounter (Signed)
Sam is calling to see if we received a fax for a Rx for NONFORMULARY OR COMPOUNDED ITEM pain cream.  Please call back and confirm.  His direct ext. Is [phone].

## 2014-05-01 NOTE — Telephone Encounter (Signed)
I called back.  The compounding pharmacy would like to know if they may change the Compounded Rx to a new formulation for ins purposes.  The new Rx would be Topiramate 2.5%/Celecoxib2%/Duloxetine1.2% and Lidocaine 3%.  Okay to change Rx?  Please advise.  Thank you.

## 2014-05-01 NOTE — Telephone Encounter (Signed)
yes

## 2014-05-01 NOTE — Telephone Encounter (Signed)
Pharmacy has been notified.

## 2014-05-02 NOTE — Telephone Encounter (Signed)
I called back and spoke with [name] [name].  He said they have been dispensing 240gms, but for some reason their computer defaulted to 60gms.  Advised it is okay to keep dispensing the same amount as before.

## 2014-05-02 NOTE — Telephone Encounter (Signed)
Sam with [facility] @ [phone] checking status of Compound Rx.  Please call and advise.

## 2014-05-02 NOTE — Telephone Encounter (Signed)
Rx has already been approved.  I called pharmacy back again, got no answer.  Left message.

## 2014-05-02 NOTE — Telephone Encounter (Signed)
Sam returned call and questioned if they could change quantity from 60mg  (populated in error) to 240mg  .  Please call and advise.

## 2014-05-16 ENCOUNTER — Telehealth: Payer: Self-pay

## 2014-05-16 NOTE — Telephone Encounter (Signed)
Patient needs his fluticasone (FLONASE) 50 MCG/ACT nasal spray called into [facility]. Patient stated he is Dr. Luiz Ochoa brother in law and really needed this done.

## 2014-05-17 MED ORDER — FLUTICASONE PROPIONATE 50 MCG/ACT NA SUSP: 2.0000 | Freq: Every day | NASAL | Status: DC

## 2014-05-17 NOTE — Telephone Encounter (Signed)
Pt.notified

## 2014-05-17 NOTE — Telephone Encounter (Signed)
Please advise. Has not been seen in a long time.

## 2014-05-17 NOTE — Telephone Encounter (Signed)
Refill sent.

## 2014-05-30 ENCOUNTER — Encounter: Payer: Self-pay | Admitting: Diagnostic Neuroimaging

## 2014-05-30 ENCOUNTER — Ambulatory Visit (INDEPENDENT_AMBULATORY_CARE_PROVIDER_SITE_OTHER): Payer: Medicare Other | Admitting: Diagnostic Neuroimaging

## 2014-05-30 ENCOUNTER — Telehealth: Payer: Self-pay | Admitting: *Deleted

## 2014-05-30 ENCOUNTER — Other Ambulatory Visit: Payer: Self-pay | Admitting: *Deleted

## 2014-05-30 VITALS — BP 142/89 | HR 74 | Ht 70.0 in | Wt 243.1 lb

## 2014-05-30 DIAGNOSIS — M5416 Radiculopathy, lumbar region: Secondary | ICD-10-CM | POA: Diagnosis not present

## 2014-05-30 DIAGNOSIS — G609 Hereditary and idiopathic neuropathy, unspecified: Secondary | ICD-10-CM

## 2014-05-30 MED ORDER — DULOXETINE HCL 30 MG PO CPEP: 30.0000 mg | ORAL_CAPSULE | Freq: Every day | ORAL | Status: DC

## 2014-05-30 NOTE — Addendum Note (Signed)
Addended byAndrey Spearman on: 05/30/2014 10:41 AM   Modules accepted: Orders

## 2014-05-30 NOTE — Telephone Encounter (Signed)
Spoke to the pt on the phone and informed him that a 30 day supply of cymbalta was called into [facility]. He thanked me

## 2014-05-30 NOTE — Patient Instructions (Signed)
Try cymbalta 30mg  daily x 2 weeks, then increase to 60mg  daily.  Try rollator walker.

## 2014-05-30 NOTE — Progress Notes (Signed)
[facility]  PATIENT: [name] [name] DOB: [birth year]  REFERRING CLINICIAN:  HISTORY FROM: patient  REASON FOR VISIT: follow up   HISTORICAL  CHIEF COMPLAINT:  Chief Complaint  Patient presents with  . Follow-up    lumbar radiculopathy    HISTORY OF PRESENT ILLNESS:   UPDATE 05/30/14: Since last neuropathy pain is increasing. Not tried cream again since it has topiramate (was concerned about his glaucoma). Mood stable.  UPDATE 01/23/14 ([facility]P): Since last visit, having more memory problems, agitation, anxiety, and pain. Has sleep apnea dx but couldn't tolerate CPAP in the past (last tried 3 yrs ago). Also PTSD getting worse (not on meds, not getting therapy). He is requesting to taper off gabapentin, b/c his wife tells him that he had similar issues in the past when he was on gabapentin yrs ago.  He wants to go back to neuropathy cream, in spite of high cost, and inability to find lower price a local pharmacy or [place].  UPDATE 07/29/13 (LL): Since last visit, patient has had good relief with transdermal therapeutic cream. He wants to continue to use this although the price went up to $300 for 3 tubes. He would like to get it cheaper at a local pharmacy if possible. His wife was noticing more memory loss mainly short-term. Dr. Gwenette Greet recommended he decrease his dose of Gralise by half. While he notices that this helped with his short-term memory he also has increased pain. Right shoulder pain continues to be treated with Lidoderm patch. Since last visit he was hospitalized with breathing problems and was diagnosed with adult-onset asthma.  UPDATE 01/14/13 ([facility]P): 74 year old male here for evaluation and followup of lumbar radiculopathy pain. Patient formerly evaluated by Dr. Erling Cruz. Patient is on gabapentin and Lidoderm patches for pain control. Gabapentin seems to wear off in the night. He also has tried hydrocodone to PCP with mild relief. His main complaint today is pain from  his knees down to his feet. This is chronic and suspected to be related to lumbar radiculopathy. Also of note patient has history of left ICA dissection in 1994, treated conservatively with antiplatelet therapy (aspirin). 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 Patient has history of cervical and lumbar compression fractures. Currently he is also dealing with the right shoulder pain and right lung lesion under surveillance.  PRIOR HPI (03/02/12, Dr. Erling Cruz): 74 year old right-handed white married male with a history of spontaneous dissection of his left internal carotid artery in 1996 associated with "numb tongue" syndrome and left-sided headache. He was treated with Coumadin therapy and the dissection gradually resolved. He was admitted to [facility] 10/1999 with a right kidney infarct causing right sided flank pain. 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 There was also weblike stenosis of accessory left renal artery supplying the upper pole. The stenosis was noted at the bifurcation the vessels. There was no evidence of atherosclerotic disease in the aorta or iliac arteries. The Iliac arteries were tortuous. It was felt the underlying cause of the abnormality in the right inferior lobar artery and accessory left renal artery was on the basis of fibromuscular dysplasia or intrinsic arteritis. Workup for collagen vascular disorder was negative. 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 He has a long history neck pain with C5-C6 fusion in 1984. He had his right shoulder replaced in  1997 with only fair results. He has a residual fracture near one of the screws and orthopedic consultation with Dr. Windle Guard in [place] stated it would take one and a half years and 4 operations to treat the shoulder. He had a second opinion at  the [facility] and is considering surgery. He has a history of spinal compression fractures over 35 years ago.He has a bone spur at his ankle and pain with walking. He has right wrist arthritis. He has pain in the back of his neck soreness to touch and pain in his right shoulder all the time though there is some pain in both shoulders. He has tingling in the thumb,index, and third finger of his right hand and rarely fourth and fifth finger. He as similar symptoms in his left hand but not as severe. EMG/NCV 10/15/10 showed normal motor conduction velocity of the upper extremities and mild chronic stable changes of a right C6 radiculopathy. He has awakened at night with tremor and his hand/arm shaking, as often as 3 times per week. He denies Lhermitte's sign or bowel or bladder dysfunction. The numbness in his hand comes and goes. I reviewed the MRI study of the cervical spine 07/23/2010 showing solid interbody fusion at C5-6, DJD and spondylosis and C6-7 causing stenosis and mild foraminal narrowing bilaterally. Mild DJD at C4- 5 with what I think is significant facet disease at C4-5 on the right. he continues to have pain. This affects his ability to walk. He has a handicapped bathroom in his home and has built a ramp. He takes hydrocodone-acetaminophen 5-325 approximately 5 a week. He is followed by Dr. Lawerance Bach urologist for the question of prostate cancer.PSA 08/29/2011 was 4.2. He underwent CT scan of his right shoulder and an asymptomatic "spot" on the right lung was found. He is having a followup CT scan 2014 before considering surgery to his right shoulder at the [facility] [place]. He denies shortness of breath, chest pain, hemoptysis, or weight loss He complains of numbness in both hands and both feet, right hand greater than left predominantly in the thumb index and third finger. He has burning in both feet.This can awaken him at night with burning. He is using Lidoderm  patches, a half on each foot and a half on the right shoulder. He describes fingertips turning white and then blue.Blood studies 08/29/2011 were normal CBC, CMP except total bilirubin 1.6, TSH,and B12.I note he has high blood sugars in some of Dr. Luiz Ochoa notes which raises the question of diabetes. He may need hemoglobin A1c  REVIEW OF SYSTEMS: Full 14 system review of systems performed and notable only for hearing loss ringing in ears wheezing loss of vision environmental allergies agitation confusion anxiety memory loss urinary urgency joint pain walking difficulty neck pain joint swelling insomnia apnea frequent waking daytime sleepiness.  ALLERGIES: Allergies  Allergen Reactions  . Shrimp [Shellfish Allergy] Anaphylaxis  . Contrast Media [Iodinated Diagnostic Agents] Other (See Comments)    Skin reaction-not sure exactly-per MD  . Morphine Hives  . Oxycodone-Acetaminophen Nausea And Vomiting  . Penicillins Swelling and Rash    HOME MEDICATIONS: Outpatient Prescriptions Prior to Visit  Medication Sig Dispense Refill  . Ascorbic Acid (VITAMIN C) 1000 MG tablet Take 1,000 mg by mouth daily.      Marland Kitchen aspirin 325 MG EC tablet Take 162.5 mg by mouth daily.     . B Complex Vitamins (VITAMIN B COMPLEX) TABS Take 1 tablet by mouth daily.    Marland Kitchen  chlorthalidone (HYGROTON) 25 MG tablet Take 0.5 tablets (12.5 mg total) by mouth daily. 45 tablet 3  . cholecalciferol (VITAMIN D) 1000 UNITS tablet Take 2,000 Units by mouth daily.     . fluticasone (FLONASE) 50 MCG/ACT nasal spray Place 2 sprays into both nostrils daily. 48 g 1  . lidocaine (LIDODERM) 5 % APPLY ONE-HALF (1/2) PATCH TO EACH FOOT AND ONE-HALF (1/2) PATCH TO 1 SHOULDER IN THE EVENING 135 patch 1  . losartan (COZAAR) 25 MG tablet Take 1 tablet (25 mg total) by mouth daily. 180 tablet 3  . Multiple Vitamins-Minerals (ONE-A-DAY 50 PLUS PO) Take 1 tablet by mouth daily.    . Multiple Vitamins-Minerals (PRESERVISION AREDS 2) CAPS Take 1 capsule  by mouth 2 (two) times daily.    . NONFORMULARY OR COMPOUNDED ITEM Compounded Pain Cream.  Active Ingredients: Ketamine 10%, Baclofen 2%, Cyclobenzaprine 2%, Diclofenac 3%, Gabapentin 6%, Bupivacaine 2%.   Dispense 240 gm. Apply 1-2 grams topically to lower legs and feet 3 times a day prn. 3 each 5  . PROAIR HFA 108 (90 BASE) MCG/ACT inhaler INHALE 2 PUFFS INTO THE LUNGS EVERY 4 HOURS AS NEEDED FOR WHEEZING 3 each 1  . SYMBICORT 160-4.5 MCG/ACT inhaler USE 2 INHALATIONS TWICE A DAY 3 Inhaler 3  . verapamil (CALAN-SR) 120 MG CR tablet Take 1 tablet (120 mg total) by mouth daily. 90 tablet 3   No facility-administered medications prior to visit.    PAST MEDICAL HISTORY: Past Medical History  Diagnosis Date  . HTN (hypertension)   . PVC (premature ventricular contraction)   . History of agent Orange exposure   . Retinopathy     related to PVC's  . Dissection of carotid artery     history  . Dissection of renal artery     history  . GERD (gastroesophageal reflux disease)   . History of hepatitis 1969    [place], unknown what type  . Anal fissure   . Anxiety disorder   . Arthritis   . Asthma   . Adenomatous colon polyp   . Diverticulosis   . Gallstones   . Cardiomyopathy     EF of 40-45%    PAST SURGICAL HISTORY: Past Surgical History  Procedure Laterality Date  . Appendectomy  1963  . Cervical fusion  1985  . Cholecystectomy  1990  . Tonsillectomy  1965  . Hernia repair    . Carotid disection    . Right renal artery disection    . Nose surgery    . Vasectomy    . Total shoulder replacement      Right    FAMILY HISTORY: Family History  Problem Relation Age of Onset  . Heart disease Father   . Colon cancer      uncle    SOCIAL HISTORY:  History   Social History  . Marital Status: Married    Spouse Name: Santiago Glad  . Number of Children: 3  . Years of Education: 16   Occupational History  . ex marine   . retired     Equities trader   Social  History Main Topics  . Smoking status: Former Smoker -- 0.50 packs/day for 15 years    Types: Cigarettes    Quit date: 02/11/1988  . Smokeless tobacco: Never Used     Comment: QUIT late 70s/early 43s  . Alcohol Use: 0.0 oz/week    0 Standard drinks or equivalent per week     Comment: 0-3 daily  .  Drug Use: No  . Sexual Activity: Not on file   Other Topics Concern  . Not on file   Social History Narrative   Lives in [place] at home with wife    Married in [place] from previous marriage.   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 Elder Cyphers   Drinks caffeine: depending on day 0-3 cups      PHYSICAL EXAM  Filed Vitals:   05/30/14 0956  BP: 142/89  Pulse: 74  Height: 5\' 10"  (1.778 m)  Weight: 243 lb 1.6 oz (110.269 kg)    Body mass index is 34.88 kg/(m^2).  No exam data present  No flowsheet data found.  GENERAL EXAM: Patient is in no distress; well developed, nourished and groomed; neck is supple  CARDIOVASCULAR: Regular rate and rhythm, no murmurs, no carotid bruits  NEUROLOGIC: MENTAL STATUS: awake, alert, language fluent, comprehension intact, naming intact, fund of knowledge appropriate;  CRANIAL NERVE: pupils equal and reactive to light, DECR VISION IN RIGHT EYE, visual fields full to confrontation, extraocular muscles intact, no nystagmus, facial sensation and strength symmetric, hearing intact, palate elevates symmetrically, uvula midline, shoulder shrug symmetric, tongue midline. MOTOR: normal bulk and tone, full strength in the BUE, BLE; EXCEPT DECR IN RUE DUE TO RIGHT SHOULDER PAIN SENSORY: normal and symmetric to light touch; DECR VIB AT TOES COORDINATION: finger-nose-finger, fine finger movements normal REFLEXES: BUE 1, BLE 0 GAIT/STATION: narrow based gait; ANTALGIC GAIT     DIAGNOSTIC DATA (LABS, IMAGING, TESTING) - I reviewed patient records, labs, notes, testing and imaging myself where  available.  Lab Results  Component Value Date   WBC 6.1 01/27/2014   HGB 15.4 01/27/2014   HCT 46.1 01/27/2014   MCV 91.2 01/27/2014   PLT 196.0 01/27/2014      Component Value Date/Time   NA 139 03/23/2014 0817   K 4.0 03/23/2014 0817   CL 105 03/23/2014 0817   CO2 29 03/23/2014 0817   GLUCOSE 102* 03/23/2014 0817   BUN 15 03/23/2014 0817   CREATININE 0.82 03/23/2014 0817   CREATININE 0.89 06/09/2013 1604   CALCIUM 9.0 03/23/2014 0817   PROT 6.9 01/27/2014 0734   ALBUMIN 4.3 01/27/2014 0734   AST 24 01/27/2014 0734   ALT 20 01/27/2014 0734   ALKPHOS 56 01/27/2014 0734   BILITOT 1.3* 01/27/2014 0734   GFRNONAA 83* 06/23/2013 0316   GFRAA >90 06/23/2013 0316   Lab Results  Component Value Date   CHOL 157 01/27/2014   HDL 44.00 01/27/2014   LDLCALC 89 01/27/2014   TRIG 121.0 01/27/2014   CHOLHDL 4 01/27/2014   Lab Results  Component Value Date   HGBA1C 5.3 03/07/2012   Lab Results  Component Value Date   [id number] 076 08/29/2011   Lab Results  Component Value Date   TSH 0.993 02/07/2013    10/15/10 EMG/NCS ([facility]) - Nerve conduction studies done on both upper extremities were unremarkable, without evidence of a neuropathy or carpal tunnel syndrome on either side. EMG study of the right upper extremity shows mild chronic stable changes consistent with a C6 radiculopathy. EMG study of the left upper extremity was unremarkable. There is no evidence of an acute cervical radiculopathy on either side.     ASSESSMENT AND PLAN  74 y.o. year old male here with:  - Peripheral neuropathy (? paraneoplastic from lung lesion) vs lumbar radiculopathy - Right shoulder pain - Right wrist pain -  Fibromuscular dysplasia with history of dissection of the left internal carotid artery and right renal artery - Status post cervical fusion - Cardiomyopathy   PLAN: - Try neuropathy cream again; he is willing to pay cash b/c insurance won't cover it anymore; ok to use topical  topiramate since there is minimal systemic absorption from glaucoma standpoint - trial of cymbalta - rollator walker for balance  Return in about 6 months (around 11/29/2014).  I spent 15 minutes of face to face time with patient. Greater than 50% of time was spent in counseling and coordination of care with patient.     Penni Bombard, MD 06/18/3265, 12:45 AM Certified in Neurology, Neurophysiology and Neuroimaging  [facility] [address] [place], [place] [address] [phone]

## 2014-06-03 ENCOUNTER — Other Ambulatory Visit: Payer: Self-pay | Admitting: Internal Medicine

## 2014-06-13 ENCOUNTER — Telehealth: Payer: Self-pay | Admitting: Diagnostic Neuroimaging

## 2014-06-13 NOTE — Telephone Encounter (Signed)
I called back.  Patient refused to speak with me because I am not a RN, MD or Pharm D.  Said he only wishes to speak to persons holding these titles.  He would not divulge what his inquiry was, and became upset and told me to forget it and call disconnected.

## 2014-06-13 NOTE — Telephone Encounter (Signed)
Patient called requesting to speak with a nurse regarding his script for DULoxetine (CYMBALTA) 30 MG capsule. He did not want to go into details on what exactly he wanted to discuss. Patient can be reached @ [phone]

## 2014-06-14 NOTE — Telephone Encounter (Signed)
Patient returned call. Please call back

## 2014-06-14 NOTE — Telephone Encounter (Signed)
Called the pt and spoke with him on the phone. He told me that the cymbalta works extremely well for the pain in his feet and his mood improved greatly. But it made him have ED issues. He told me that he is still very sexually active with his wife and this medication made things worse. He stopped taking the cymbalta and the ED symptoms improved. He wants to know if there is anything else that he could try that would not take away his sex drive and would also take away the pain in his feet and improve his mood. I told him that I would talk with Dr. Leta Baptist about this when he returned on Monday 06/19/14 and that I would get back with him about what Dr. Mamie Nick suggested. He also wanted me to tell Dr. Mamie Nick that the compound cream worked great but he was having to use it every 2 hours. I told him I would pass along this information and get back with him on Monday. He thanked me

## 2014-06-14 NOTE — Telephone Encounter (Signed)
Called and left a message for the pt asking him to call me back

## 2014-06-19 NOTE — Telephone Encounter (Signed)
Spoke to Dr. Leta Baptist who gave me three options to tell the pt about 1) continue to use the compound cream since it worked and did not have an ED side effects. 2) try taking 30 mg of cymbalta every other day for 2-4 weeks and see if it that would help with both the neuropathy pain and the not cause and ED symptoms. 3) he could prescribe lyrica, but it may cause the same side effects and may cost more money  The pt stated that he would try taking the cymbalta 30 mg every other day and call me back in a few weeks to let me know how things were going with him. He thanked me and stated and understanding and he also asked me to thank Dr. Leta Baptist

## 2014-07-07 DIAGNOSIS — R972 Elevated prostate specific antigen [PSA]: Secondary | ICD-10-CM | POA: Diagnosis not present

## 2014-07-07 DIAGNOSIS — N401 Enlarged prostate with lower urinary tract symptoms: Secondary | ICD-10-CM | POA: Diagnosis not present

## 2014-07-07 DIAGNOSIS — N4340 Spermatocele of epididymis, unspecified: Secondary | ICD-10-CM | POA: Diagnosis not present

## 2014-07-07 DIAGNOSIS — R351 Nocturia: Secondary | ICD-10-CM | POA: Diagnosis not present

## 2014-08-16 ENCOUNTER — Other Ambulatory Visit: Payer: Self-pay | Admitting: Diagnostic Neuroimaging

## 2014-09-21 ENCOUNTER — Ambulatory Visit: Payer: Medicare Other | Admitting: Pulmonary Disease

## 2014-09-25 ENCOUNTER — Encounter: Payer: Self-pay | Admitting: Internal Medicine

## 2014-09-25 ENCOUNTER — Ambulatory Visit (INDEPENDENT_AMBULATORY_CARE_PROVIDER_SITE_OTHER): Payer: Medicare Other | Admitting: Internal Medicine

## 2014-09-25 VITALS — BP 132/80 | HR 64 | Ht 70.0 in | Wt 241.0 lb

## 2014-09-25 DIAGNOSIS — J45909 Unspecified asthma, uncomplicated: Secondary | ICD-10-CM | POA: Diagnosis not present

## 2014-09-25 DIAGNOSIS — E669 Obesity, unspecified: Secondary | ICD-10-CM

## 2014-09-25 NOTE — Assessment & Plan Note (Signed)
PFT's 06/2013:  Normal spirometry by numbers, but 14% increase FEV1 with BD.  ++airtrapping, +obstruction on FVL CT sinuses 2015:  Thickening of maxillary sinuses, but no A/F levels.    The proper method of use, as well as anticipated side effects, of a metered-dose inhaler are discussed and demonstrated to the patient. Improved effectiveness after extensive coaching during this visit to a level of approximately  90% from a baseline of 75%  All goals of chronic asthma control met including optimal function and elimination of symptoms with minimal need for rescue therapy.  Contingencies discussed in full including contacting this office immediately if not controlling the symptoms using the rule of two's.    I had an extended discussion with the patient reviewing all relevant studies completed to date and  lasting 15 to 20 minutes of a 25 minute visit    Each maintenance medication was reviewed in detail including most importantly the difference between maintenance and prns and under what circumstances the prns are to be triggered using an action plan format that is not reflected in the computer generated alphabetically organized AVS.    Please see instructions for details which were reviewed in writing and the patient given a copy highlighting the part that I personally wrote and discussed at today's ov.  

## 2014-09-25 NOTE — Progress Notes (Signed)
Subjective:     Patient ID: [name] [name], male   DOB: [birth year],     MRN: [id number]  HPI  49 yowm quit smoking around 1990 followed by Dr Gwenette Greet with prev dx asthma/cr    09/25/2014 1st  office visit/ [name] [name]   Chief Complaint  Patient presents with  . Follow-up    Former [facility] pt. Pt states occasional productive cough with thick brown mucus, PND, and nasal congestion. Pt states using flonase everyday with relief. Pt denies SOB, wheezing, chest pain or tightness   on symb 160 2 each am Some am cough  - doing better on symbicort/ flonase maint rx/ no need for saba  Not limited by breathing from desired activities    No obvious day to day or daytime variability or assoc  cp or chest tightness, subjective wheeze or hb symptoms. No unusual exp hx or h/o childhood pna/ asthma or knowledge of premature birth.  Sleeping ok without nocturnal  or early am exacerbation  of respiratory  c/o's or need for noct saba. Also denies any obvious fluctuation of symptoms with weather or environmental changes or other aggravating or alleviating factors except as outlined above   Current Medications, Allergies, Complete Past Medical History, Past Surgical History, Family History, and Social History were reviewed in [employer] record.  ROS  The following are not active complaints unless bolded sore throat, dysphagia, dental problems, itching, sneezing,  nasal congestion or excess/ purulent secretions, ear ache,   fever, chills, sweats, unintended wt loss, classically pleuritic or exertional cp, hemoptysis,  orthopnea pnd or leg swelling, presyncope, palpitations, abdominal pain, anorexia, nausea, vomiting, diarrhea  or change in bowel or bladder habits, change in stools or urine, dysuria,hematuria,  rash, arthralgias, visual complaints, headache, numbness, weakness or ataxia or problems with walking or coordination,  change in mood/affect or memory.         Review of Systems      Objective:   Physical Exam  Very pleasant obese wm nad   Wt Readings from Last 3 Encounters:  09/25/14 241 lb (109.317 kg)  05/30/14 243 lb 1.6 oz (110.269 kg)  04/05/14 244 lb 3.2 oz (110.768 kg)    Vital signs reviewed   HEENT: nl dentition, turbinates, and orophanx. Nl external ear canals without cough reflex   NECK :  without JVD/Nodes/TM/ nl carotid upstrokes bilaterally   LUNGS: no acc muscle use, clear to A and P bilaterally without cough on insp or exp maneuvers - pseudowheeze only, resolves with plm    CV:  RRR  no s3 or murmur or increase in P2, no edema   ABD:  soft and nontender with nl excursion in the supine position. No bruits or organomegaly, bowel sounds nl  MS:  warm without deformities, calf tenderness, cyanosis or clubbing  SKIN: warm and dry without lesions    NEURO:  alert, approp, no deficits        Assessment:

## 2014-09-25 NOTE — Patient Instructions (Addendum)
Work on maintaining optimal inhaler technique:  relax and gently blow all the way out then take a nice smooth deep breath back in, triggering the inhaler at same time you start breathing in.  Hold for up to 5 seconds if you can. Blow out thru nose. Rinse and gargle with water when done   the next refill for symbicort I would try the 80  Take 2 puffs first thing in am and then another 2 puffs about 12 hours later.   Only use your albuterol as a rescue medication to be used if you can't catch your breath by resting or doing a relaxed purse lip breathing pattern.  - The less you use it, the better it will work when you need it. - Ok to use up to 2 puffs  every 4 hours if you must but call for immediate appointment if use goes up over your usual need - Don't leave home without it !!  (think of it like the spare tire for your car)    If you are satisfied with your treatment plan,  let your doctor know and he/she can either refill your medications or you can return here when your prescription runs out.     If in any way you are not 100% satisfied,  please tell us.  If 100% better, tell your friends!  Pulmonary follow up is as needed

## 2014-09-25 NOTE — Assessment & Plan Note (Signed)
Body mass index is 34.58 kg/(m^2).  Lab Results  Component Value Date   TSH 0.993 02/07/2013     Contributing to  hbp eviewed need  achieve and maintain neg calorie balance > defer f/u primary care including intermittently monitoring thyroid status

## 2014-09-26 ENCOUNTER — Ambulatory Visit (HOSPITAL_COMMUNITY): Payer: Medicare Other | Attending: Cardiovascular Disease

## 2014-09-26 ENCOUNTER — Other Ambulatory Visit: Payer: Self-pay

## 2014-09-26 DIAGNOSIS — I429 Cardiomyopathy, unspecified: Secondary | ICD-10-CM | POA: Diagnosis not present

## 2014-09-26 DIAGNOSIS — I517 Cardiomegaly: Secondary | ICD-10-CM | POA: Diagnosis not present

## 2014-09-26 DIAGNOSIS — E669 Obesity, unspecified: Secondary | ICD-10-CM | POA: Diagnosis not present

## 2014-09-26 DIAGNOSIS — I10 Essential (primary) hypertension: Secondary | ICD-10-CM | POA: Insufficient documentation

## 2014-09-26 DIAGNOSIS — Z6833 Body mass index (BMI) 33.0-33.9, adult: Secondary | ICD-10-CM | POA: Diagnosis not present

## 2014-10-03 ENCOUNTER — Encounter: Payer: Self-pay | Admitting: Cardiology

## 2014-10-03 ENCOUNTER — Ambulatory Visit (INDEPENDENT_AMBULATORY_CARE_PROVIDER_SITE_OTHER): Payer: Medicare Other | Admitting: Cardiology

## 2014-10-03 VITALS — BP 134/82 | HR 79 | Ht 70.0 in | Wt 242.0 lb

## 2014-10-03 DIAGNOSIS — I5022 Chronic systolic (congestive) heart failure: Secondary | ICD-10-CM

## 2014-10-03 DIAGNOSIS — I499 Cardiac arrhythmia, unspecified: Secondary | ICD-10-CM | POA: Diagnosis not present

## 2014-10-03 DIAGNOSIS — E785 Hyperlipidemia, unspecified: Secondary | ICD-10-CM | POA: Diagnosis not present

## 2014-10-03 DIAGNOSIS — I10 Essential (primary) hypertension: Secondary | ICD-10-CM | POA: Diagnosis not present

## 2014-10-03 DIAGNOSIS — I493 Ventricular premature depolarization: Secondary | ICD-10-CM

## 2014-10-03 DIAGNOSIS — I498 Other specified cardiac arrhythmias: Secondary | ICD-10-CM | POA: Insufficient documentation

## 2014-10-03 MED ORDER — METOPROLOL SUCCINATE ER 25 MG PO TB24: 25.0000 mg | ORAL_TABLET | Freq: Every day | ORAL | Status: DC

## 2014-10-03 NOTE — Progress Notes (Signed)
Patient ID: [name] [name], male   DOB: 02-16-1940, 74 y.o.   MRN: [id number]    Patient Name: [name] [name] Date of Encounter: 10/03/2014  Primary Care Provider:  Kennon Portela, MD Primary Cardiologist:  Dorothy Spark  Problem List   Past Medical History  Diagnosis Date  . HTN (hypertension)   . PVC (premature ventricular contraction)   . History of agent Orange exposure   . Retinopathy     related to PVC's  . Dissection of carotid artery     history  . Dissection of renal artery     history  . GERD (gastroesophageal reflux disease)   . History of hepatitis 1969    [place], unknown what type  . Anal fissure   . Anxiety disorder   . Arthritis   . Asthma   . Adenomatous colon polyp   . Diverticulosis   . Gallstones   . Cardiomyopathy     EF of 40-45%   Past Surgical History  Procedure Laterality Date  . Appendectomy  1963  . Cervical fusion  1985  . Cholecystectomy  1990  . Tonsillectomy  1965  . Hernia repair    . Carotid disection    . Right renal artery disection    . Nose surgery    . Vasectomy    . Total shoulder replacement      Right    Allergies  Allergies  Allergen Reactions  . Shrimp [Shellfish Allergy] Anaphylaxis  . Contrast Media [Iodinated Diagnostic Agents] Other (See Comments)    Skin reaction-not sure exactly-per MD  . Morphine Hives  . Oxycodone-Acetaminophen Nausea And Vomiting  . Penicillins Swelling and Rash    HPI  Mr Hlavaty is a prior patient of Dr. wall. He is coming after one year for a followup.  The patient has a history of nonischemic cardiomyopathy with ejection fraction of 40-45%, history of hypertension, history of spontaneous dissection of his carotid artery as well as his renal artery, history of symptomatic PVCs which used to cause chest pain but not palpitations. He was started on diltiazem by Dr. Caryl Comes with significant improvement of his symptoms.  The patient was seen a year ago by Dr. wall for preop  evaluation for complex right shoulder surgery. As a part of his preop evaluation for lung nodules was found in his right lung. This was followed 3 months later with mild enlargement of the nodule, and patient is currently waiting for another CT scan as a year followup. Because of this issue he's shoulder surgery has been postponed for now.  The patient denies symptoms of angina, shortness of breath, dizziness palpitations or syncope. He also denies symptoms of orthopnea paroxysmal nocturnal dyspnea or lower extremity swelling. He complains of tingling in his lower extremities consistent with neuropathy.  01/19/2014 - patient is coming after one year, he states that his lisinopril was discontinued because of severe hypotension down to 70s. He denies any chest pain, is stable dyspnea on moderate exertion. He is not extremity edema. He does feel occasional palpitations but no syncope. Has been compliant with his meds.  04/05/2014 - the patient is coming after 3 months, he states that he is feeling well he has stable dyspnea on exertion that he attributes to his asthma which he takes Symbicort daily. In the meantime he was diagnosed with glaucoma and is undergoing treatment. The patient underwent transthoracic echocardiography that showed left ventricular ejection fraction of 45 to 50% and he was started on losartan 25  mg orally daily. He had follow-up labs and his creatinine and potassium remained normal.  10/03/14 - 6 months follow up, his most recent echo shows worsening LVEF now 35-40% with diffuse hypokinesis. The patient noticed minimal worsening DOE, and more palpitations, no syncope, no chest pain, LE edema, PND.  Home Medications  Prior to Admission medications   Medication Sig Start Date End Date Taking? Authorizing Provider  albuterol (PROAIR HFA) 108 (90 BASE) MCG/ACT inhaler Inhale 2 puffs into the lungs every 4 (four) hours as needed for wheezing. 10/04/12  Yes Orma Flaming, MD  ALPRAZolam  Duanne Moron) 0.25 MG tablet Take 0.25 mg by mouth 3 (three) times daily as needed.    Yes Historical Provider, MD  Ascorbic Acid (VITAMIN C) 1000 MG tablet Take 1,000 mg by mouth daily.     Yes Historical Provider, MD  aspirin 325 MG EC tablet Take 162.5 mg by mouth daily.    Yes Historical Provider, MD  celecoxib (CELEBREX) 200 MG capsule Take 200 mg by mouth every other day.  10/24/11  Yes Heather M Marte, PA-C  chlorthalidone (HYGROTON) 25 MG tablet Take 1 tablet (25 mg total) by mouth daily. 03/07/12  Yes Orma Flaming, MD  cholecalciferol (VITAMIN D) 1000 UNITS tablet Take 2,000 Units by mouth daily.    Yes Historical Provider, MD  fluticasone (FLONASE) 50 MCG/ACT nasal spray Place 2 sprays into the nose daily. 06/01/12  Yes Orma Flaming, MD  gabapentin (NEURONTIN) 600 MG tablet Take one tab po bid and 2 tabs po at night 06/04/12  Yes Kathrynn Ducking, MD  HYDROcodone-acetaminophen (NORCO/VICODIN) 5-325 MG per tablet Take 1 tablet by mouth every 8 (eight) hours as needed. 06/01/12  Yes Orma Flaming, MD  lidocaine (LIDODERM) 5 % Apply one half patch to each foot and one half patch to one shoulder in the evening. 06/04/12  Yes Kathrynn Ducking, MD  lisinopril (PRINIVIL,ZESTRIL) 20 MG tablet Take 1 tablet (20 mg total) by mouth 2 (two) times daily. PATIENT NEEDS OFFICE VISIT FOR ADDITIONAL REFILLS - 2nd NOTICE 12/14/12  Yes Orma Flaming, MD  predniSONE (DELTASONE) 10 MG tablet Take 1 po pc bid for up to 5 days prn severe joint pain 06/01/12  Yes Orma Flaming, MD  trolamine salicylate (ASPERCREME) 10 % cream Apply 1 application topically 4 (four) times daily. For pain   Yes Historical Provider, MD  verapamil (CALAN-SR) 120 MG CR tablet Take 1 tablet (120 mg total) by mouth daily. 02/26/12  Yes Renella Cunas, MD  vitamin B-12 (CYANOCOBALAMIN) 100 MCG tablet Take 50 mcg by mouth daily.   Yes Historical Provider, MD    Family History  Family History  Problem Relation Age of Onset  . Heart disease Father     . Colon cancer      uncle    Social History  Social History   Social History  . Marital Status: Married    Spouse Name: Santiago Glad  . Number of Children: 3  . Years of Education: 16   Occupational History  . ex marine   . retired     Equities trader   Social History Main Topics  . Smoking status: Former Smoker -- 0.50 packs/day for 15 years    Types: Cigarettes    Quit date: 02/11/1988  . Smokeless tobacco: Never Used     Comment: QUIT late 70s/early 55s  . Alcohol Use: 0.0 oz/week    0 Standard drinks or equivalent per week  Comment: 0-3 daily  . Drug Use: No  . Sexual Activity: Not on file   Other Topics Concern  . Not on file   Social History Narrative   Lives in [place] at home with wife    Married in [place] from previous marriage.   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 Elder Cyphers   Drinks caffeine: depending on day 0-3 cups      Review of Systems, as per HPI, otherwise negative General:  No chills, fever, night sweats or weight changes.  Cardiovascular:  No chest pain, dyspnea on exertion, edema, orthopnea, palpitations, paroxysmal nocturnal dyspnea. Dermatological: No rash, lesions/masses Respiratory: No cough, dyspnea Urologic: No hematuria, dysuria Abdominal:   No nausea, vomiting, diarrhea, bright red blood per rectum, melena, or hematemesis Neurologic:  No visual changes, wkns, changes in mental status. All other systems reviewed and are otherwise negative except as noted above.  Physical Exam 122/82 mmHg, 79 BPM, W 245 lbs General: Pleasant, NAD Psych: Normal affect. Neuro: Alert and oriented X 3. Moves all extremities spontaneously. HEENT: Normal  Neck: Supple without bruits or JVD. Lungs:  Resp regular and unlabored, CTA. Heart: RRR no s3, s4, or murmurs. Abdomen: Soft, non-tender, non-distended, BS + x 4.  Extremities: No clubbing, cyanosis or edema. DP/PT/Radials 2+ and equal  bilaterally.  Labs:  No results for input(s): CKTOTAL, CKMB, TROPONINI in the last 72 hours. Lab Results  Component Value Date   WBC 6.1 01/27/2014   HGB 15.4 01/27/2014   HCT 46.1 01/27/2014   MCV 91.2 01/27/2014   PLT 196.0 01/27/2014   No results for input(s): NA, K, CL, CO2, BUN, CREATININE, CALCIUM, PROT, BILITOT, ALKPHOS, ALT, AST, GLUCOSE in the last 168 hours.  Invalid input(s): LABALBU Lab Results  Component Value Date   CHOL 157 01/27/2014   HDL 44.00 01/27/2014   LDLCALC 89 01/27/2014   TRIG 121.0 01/27/2014   Lab Results  Component Value Date   DDIMER 0.61* 06/21/2013   Invalid input(s): POCBNP  Accessory Clinical Findings  Echocardiogram - 12/17/2010 Left ventricle: The cavity size was normal. Wall thickness was increased in a pattern of mild LVH. Systolic function was mildly to moderately reduced. The estimated ejection fraction was in the range of 40% to 45%. Diffuse hypokinesis. Doppler parameters are consistent with abnormal left ventricular relaxation (grade 1 diastolic dysfunction). - Aortic valve: Transvalvular velocity was within the normal range. There was no stenosis. - Left atrium: The atrium was mildly dilated. - Right ventricle: The cavity size was normal. Systolic function was normal. - Right atrium: The atrium was mildly dilated. - Pulmonary arteries: PA peak pressure: 26mm Hg (S). - Inferior vena cava: The vessel was normal in size; the respirophasic diameter changes were in the normal range (= 50%); findings are consistent with normal central venous pressure. Impressions:  - Normal LV size with mild LV hypertrophy. EF 40-45% with global hypokinesis. Normal RV size and systolic function. No significant valvular abnormalities.  Echocardogram - 01/25/2014 Left ventricle: The cavity size was normal. Wall thickness was normal. Systolic function was mildly reduced. The estimated ejection fraction was in the  range of 45% to 50%. Diffuse hypokinesis. Doppler parameters are consistent with abnormal left ventricular relaxation (grade 1 diastolic dysfunction). - Aortic valve: There was trivial regurgitation. - Left atrium: The atrium was mildly dilated  ECG - sinus rhythm, occasional PVCs, 76 beats per minute otherwise normal EKG.  TTE: 09/26/2014 Study  Conclusions  - Left ventricle: The cavity size was normal. Wall thickness was normal. Systolic function was moderately reduced. The estimated ejection fraction was in the range of 35% to 40%. Diffuse hypokinesis. Doppler parameters are consistent with abnormal left ventricular relaxation (grade 1 diastolic dysfunction). - Right ventricle: Systolic function was mildly reduced.  Impressions:  - Moderate global reduction in LV function; grade 1 diastolic dysfunction; mildly reduced RV function.  Assessment & Plan  1. Nonischemic cardiomyopathy - worsening LVEF now 35-40%, previously 45-50% started on losartan 6 months ago. His ECG shows bigeminy after every beat. I am suspicious that this is a presentation of frequent ectopy induced cardiomyopathy. He was diagnosed with PVCs years ago and started on verapamil, he has recently noticed more frequent palpitations.  - order 24 Holter monitor to assess PVC burden - switch verapamil to Toprol XL 25 mg po daily for low EF - schedule a stress test to rule out ischemia  If no ischemia and significant burden, we will refer for possible PVC ablation to Dr Lovena Le.  2. Symptomatic PVCs - as above  3. Hypertension - controlled after starting losartan.  4. lipid profile - checked in December 2015, triglyceride 121 HDL 44 LDL 89, this is at goal and be no need to start therapy.  Followup in 2 months.   Dorothy Spark, MD, [facility] 10/03/2014, 3:55 PM

## 2014-10-03 NOTE — Patient Instructions (Signed)
Medication Instructions:   STOP TAKING VERAPAMIL NOW  START TAKING TOPROL XL 25 MG ONCE DAILY      Testing/Procedures:  Your physician has recommended that you wear a 24 HOUR holter monitor. Holter monitors are medical devices that record the heart's electrical activity. Doctors most often use these monitors to diagnose arrhythmias. Arrhythmias are problems with the speed or rhythm of the heartbeat. The monitor is a small, portable device. You can wear one while you do your normal daily activities. This is usually used to diagnose what is causing palpitations/syncope (passing out).   Your physician has requested that you have a lexiscan myoview. For further information please visit HugeFiesta.tn. Please follow instruction sheet, as given.     Follow-Up:  2 MONTHS WITH DR Meda Coffee

## 2014-10-08 ENCOUNTER — Other Ambulatory Visit: Payer: Self-pay | Admitting: Internal Medicine

## 2014-10-11 ENCOUNTER — Encounter (HOSPITAL_COMMUNITY): Payer: Medicare Other

## 2014-10-12 ENCOUNTER — Telehealth: Payer: Self-pay | Admitting: Internal Medicine

## 2014-10-12 MED ORDER — FLUTICASONE-SALMETEROL 115-21 MCG/ACT IN AERO: 2.0000 | INHALATION_SPRAY | Freq: Two times a day (BID) | RESPIRATORY_TRACT | Status: DC

## 2014-10-12 NOTE — Telephone Encounter (Signed)
Called pt. Aware we will change symbicort to advair. He is going to have to call back for OV as he does not have his schedule. He needs this inhaler called [facility]. i have done so. Nothing further needed

## 2014-10-12 NOTE — Telephone Encounter (Signed)
Try advair hfa 115 2bid but be sure has f/u before next refill

## 2014-10-12 NOTE — Telephone Encounter (Signed)
Called spoke with pt. His symbicort is not covered but the preferred alternative is advair. If MW wants him to stay on symbicort then PA is needed [phone] Please advise MW thanks

## 2014-10-25 ENCOUNTER — Other Ambulatory Visit: Payer: Self-pay | Admitting: Physician Assistant

## 2014-11-29 ENCOUNTER — Ambulatory Visit (INDEPENDENT_AMBULATORY_CARE_PROVIDER_SITE_OTHER): Payer: Medicare Other | Admitting: Diagnostic Neuroimaging

## 2014-11-29 ENCOUNTER — Encounter: Payer: Self-pay | Admitting: Diagnostic Neuroimaging

## 2014-11-29 VITALS — BP 151/98 | HR 78 | Ht 70.0 in | Wt 244.0 lb

## 2014-11-29 DIAGNOSIS — M5416 Radiculopathy, lumbar region: Secondary | ICD-10-CM

## 2014-11-29 DIAGNOSIS — G609 Hereditary and idiopathic neuropathy, unspecified: Secondary | ICD-10-CM | POA: Diagnosis not present

## 2014-11-29 NOTE — Patient Instructions (Signed)
Thank you for coming to see [place] at [facility]. I hope we have been able to provide you high quality care today.  You may receive a patient satisfaction survey over the next few weeks. We would appreciate your feedback and comments so that we may continue to improve ourselves and the health of our patients.  - continue neuropathy cream - continue duloxetine - continue lidocaine patches   ~~~~~~~~~~~~~~~~~~~~~~~~~~~~~~~~~~~~~~~~~~~~~~~~~~~~~~~~~~~~~~~~~  DR. Mehdi Gironda'S GUIDE TO HAPPY AND HEALTHY LIVING These are some of my general health and wellness recommendations. Some of them may apply to you better than others. Please use common sense as you try these suggestions and feel free to ask me any questions.   ACTIVITY/FITNESS Mental, social, emotional and physical stimulation are very important for brain and body health. Try learning a new activity (arts, music, language, sports, games).  Keep moving your body to the best of your abilities. You can do this at home, inside or outside, the park, community center, gym or anywhere you like. Consider a physical therapist or personal trainer to get started.    NUTRITION Eat more plants: colorful vegetables, nuts, seeds and berries.  Eat less sugar, salt, preservatives and processed foods.  Avoid toxins such as cigarettes and alcohol.  Drink water when you are thirsty. Warm water with a slice of lemon is an excellent morning drink to start the day.  Consider these websites for more information The Nutrition Source (https://www.henry-hernandez.biz/) Precision Nutrition (WindowBlog.ch)   RELAXATION Consider practicing mindfulness meditation or other relaxation techniques such as deep breathing, prayer, yoga, tai chi, massage. See website mindful.org or the apps Headspace or Calm to help get started.   SLEEP Try to get at least 7-8+ hours sleep per day. Regular exercise and  reduced caffeine will help you sleep better. Practice good sleep hygeine techniques. See website sleep.org for more information.   PLANNING Prepare estate planning, living will, healthcare POA documents. Sometimes this is best planned with the help of an attorney. Theconversationproject.org and agingwithdignity.org are excellent resources.

## 2014-11-29 NOTE — Progress Notes (Signed)
[facility]  PATIENT: [name] [name] DOB: 02/16/1940  REFERRING CLINICIAN:  HISTORY FROM: patient  REASON FOR VISIT: follow up   HISTORICAL  CHIEF COMPLAINT:  Chief Complaint  Patient presents with  . Peripheral Neuropathy    rm 6  . Follow-up    6 month    HISTORY OF PRESENT ILLNESS:   UPDATE 11/29/14: Since last visit, doing well. Back on neuropathy cream. Using lidocaine patches as well. Using a golf cart around his property. Tolerating duloxetine every other day.   UPDATE 05/30/14: Since last neuropathy pain is increasing. Not tried cream again since it has topiramate (was concerned about his glaucoma). Mood stable.  UPDATE 01/23/14 (VRP): Since last visit, having more memory problems, agitation, anxiety, and pain. Has sleep apnea dx but couldn't tolerate CPAP in the past (last tried 3 yrs ago). Also PTSD getting worse (not on meds, not getting therapy). He is requesting to taper off gabapentin, b/c his wife tells him that he had similar issues in the past when he was on gabapentin yrs ago.  He wants to go back to neuropathy cream, in spite of high cost, and inability to find lower price a local pharmacy or [place].  UPDATE 07/29/13 (LL): Since last visit, patient has had good relief with transdermal therapeutic cream. He wants to continue to use this although the price went up to $300 for 3 tubes. He would like to get it cheaper at a local pharmacy if possible. His wife was noticing more memory loss mainly short-term. Dr. Gwenette Greet recommended he decrease his dose of Gralise by half. While he notices that this helped with his short-term memory he also has increased pain. Right shoulder pain continues to be treated with Lidoderm patch. Since last visit he was hospitalized with breathing problems and was diagnosed with adult-onset asthma.  UPDATE 01/14/13 (VRP): 74 year old male here for evaluation and followup of lumbar radiculopathy pain. Patient formerly evaluated by  Dr. Erling Cruz. Patient is on gabapentin and Lidoderm patches for pain control. Gabapentin seems to wear off in the night. He also has tried hydrocodone to PCP with mild relief. His main complaint today is pain from his knees down to his feet. This is chronic and suspected to be related to lumbar radiculopathy. Also of note patient has history of left ICA dissection in 1994, treated conservatively with antiplatelet therapy (aspirin). 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 Patient has history of cervical and lumbar compression fractures. Currently he is also dealing with the right shoulder pain and right lung lesion under surveillance.  PRIOR HPI (03/02/12, Dr. Erling Cruz): 74 year old right-handed white married male with a history of spontaneous dissection of his left internal carotid artery in 1996 associated with "numb tongue" syndrome and left-sided headache. He was treated with Coumadin therapy and the dissection gradually resolved. He was admitted to [facility] 10/1999 with a right kidney infarct causing right sided flank pain. 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 There was also weblike stenosis of accessory left renal artery supplying the upper pole. The stenosis was noted at the bifurcation the vessels. There was no evidence of atherosclerotic disease in the aorta or iliac arteries. The Iliac arteries were tortuous. It was felt the underlying cause of the abnormality in the right inferior lobar artery and accessory left renal artery was on the basis of fibromuscular dysplasia or intrinsic arteritis. Workup  for collagen vascular disorder was negative. 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 He has a long history neck pain with C5-C6 fusion in 1984. He had his right shoulder replaced in 1997 with only fair results. He has a  residual fracture near one of the screws and orthopedic consultation with Dr. Windle Guard in [place] stated it would take one and a half years and 4 operations to treat the shoulder. He had a second opinion at the [facility] and is considering surgery. He has a history of spinal compression fractures over 35 years ago.He has a bone spur at his ankle and pain with walking. He has right wrist arthritis. He has pain in the back of his neck soreness to touch and pain in his right shoulder all the time though there is some pain in both shoulders. He has tingling in the thumb,index, and third finger of his right hand and rarely fourth and fifth finger. He as similar symptoms in his left hand but not as severe. EMG/NCV 10/15/10 showed normal motor conduction velocity of the upper extremities and mild chronic stable changes of a right C6 radiculopathy. He has awakened at night with tremor and his hand/arm shaking, as often as 3 times per week. He denies Lhermitte's sign or bowel or bladder dysfunction. The numbness in his hand comes and goes. I reviewed the MRI study of the cervical spine 07/23/2010 showing solid interbody fusion at C5-6, DJD and spondylosis and C6-7 causing stenosis and mild foraminal narrowing bilaterally. Mild DJD at C4- 5 with what I think is significant facet disease at C4-5 on the right. he continues to have pain. This affects his ability to walk. He has a handicapped bathroom in his home and has built a ramp. He takes hydrocodone-acetaminophen 5-325 approximately 5 a week. He is followed by Dr. Lawerance Bach urologist for the question of prostate cancer.PSA 08/29/2011 was 4.2. He underwent CT scan of his right shoulder and an asymptomatic "spot" on the right lung was found. He is having a followup CT scan 2014 before considering surgery to his right shoulder at the [facility] [place]. He denies shortness of breath, chest pain, hemoptysis, or weight loss He complains of numbness  in both hands and both feet, right hand greater than left predominantly in the thumb index and third finger. He has burning in both feet.This can awaken him at night with burning. He is using Lidoderm patches, a half on each foot and a half on the right shoulder. He describes fingertips turning white and then blue.Blood studies 08/29/2011 were normal CBC, CMP except total bilirubin 1.6, TSH,and B12.I note he has high blood sugars in some of Dr. Luiz Ochoa notes which raises the question of diabetes. He may need hemoglobin A1c  REVIEW OF SYSTEMS: Full 14 system review of systems performed and notable only for hearing loss ringing in ears loss of vision food allergies agitation memory loss urinary urgency joint pain walking difficulty neck pain joint swelling frequent waking.  ALLERGIES: Allergies  Allergen Reactions  . Shrimp [Shellfish Allergy] Anaphylaxis  . Contrast Media [Iodinated Diagnostic Agents] Other (See Comments)    Skin reaction-not sure exactly-per MD  . Morphine Hives  . Oxycodone-Acetaminophen Nausea And Vomiting  . Penicillins Swelling and Rash    HOME MEDICATIONS: Outpatient Prescriptions Prior to Visit  Medication Sig Dispense Refill  . Ascorbic Acid (VITAMIN C) 1000 MG tablet Take 1,000 mg by mouth daily.      Marland Kitchen  cholecalciferol (VITAMIN D) 1000 UNITS tablet Take 2,000 Units by mouth daily.     Marland Kitchen lidocaine (LIDODERM) 5 % APPLY ONE-HALF (1/2) PATCH TO EACH FOOT AND ONE-HALF (1/2) PATCH TO 1 SHOULDER IN THE EVENING. 135 patch 3  . aspirin 325 MG EC tablet Take 162.5 mg by mouth daily.     . B Complex Vitamins (VITAMIN B COMPLEX) TABS Take 1 tablet by mouth daily.    . budesonide-formoterol (SYMBICORT) 160-4.5 MCG/ACT inhaler Inhale 2 puffs into the lungs 4 (four) times daily as needed.    . chlorthalidone (HYGROTON) 25 MG tablet Take 0.5 tablets (12.5 mg total) by mouth daily. PATIENT NEEDS OFFICE VISIT FOR ADDITIONAL REFILLS 15 tablet 0  . DULoxetine (CYMBALTA) 30 MG capsule  Take 30-60 mg by mouth every other day.    . fluticasone (FLONASE) 50 MCG/ACT nasal spray Place 2 sprays into both nostrils daily. PATIENT NEEDS OFFICE VISIT FOR ADDITIONAL REFILLS 16 g 0  . fluticasone-salmeterol (ADVAIR HFA) 115-21 MCG/ACT inhaler Inhale 2 puffs into the lungs 2 (two) times daily. 3 Inhaler 0  . losartan (COZAAR) 25 MG tablet Take 25 mg by mouth every other day.    . metoprolol succinate (TOPROL XL) 25 MG 24 hr tablet Take 1 tablet (25 mg total) by mouth daily. 90 tablet 3  . Multiple Vitamins-Minerals (ONE-A-DAY 50 PLUS PO) Take 1 tablet by mouth daily.    . Multiple Vitamins-Minerals (PRESERVISION AREDS 2) CAPS Take 1 capsule by mouth 2 (two) times daily.    Marland Kitchen nystatin-triamcinolone (MYCOLOG II) cream     . PROAIR HFA 108 (90 BASE) MCG/ACT inhaler INHALE 2 PUFFS INTO THE LUNGS EVERY 4 HOURS AS NEEDED FOR WHEEZING 3 each 1  . Turmeric 450 MG CAPS Take 1 capsule by mouth 2 (two) times daily.     No facility-administered medications prior to visit.    PAST MEDICAL HISTORY: Past Medical History  Diagnosis Date  . HTN (hypertension)   . PVC (premature ventricular contraction)   . History of agent Orange exposure   . Retinopathy     related to PVC's  . Dissection of carotid artery ([facility])     history  . Dissection of renal artery ([facility])     history  . GERD (gastroesophageal reflux disease)   . History of hepatitis 1969    [place], unknown what type  . Anal fissure   . Anxiety disorder   . Arthritis   . Asthma   . Adenomatous colon polyp   . Diverticulosis   . Gallstones   . Cardiomyopathy     EF of 40-45%    PAST SURGICAL HISTORY: Past Surgical History  Procedure Laterality Date  . Appendectomy  1963  . Cervical fusion  1985  . Cholecystectomy  1990  . Tonsillectomy  1965  . Hernia repair    . Carotid disection    . Right renal artery disection    . Nose surgery    . Vasectomy    . Total shoulder replacement      Right    FAMILY HISTORY: Family  History  Problem Relation Age of Onset  . Heart disease Father   . Colon cancer      uncle    SOCIAL HISTORY:  Social History   Social History  . Marital Status: Married    Spouse Name: Santiago Glad  . Number of Children: 3  . Years of Education: 16   Occupational History  . ex marine   . retired  Equities trader   Social History Main Topics  . Smoking status: Former Smoker -- 0.50 packs/day for 15 years    Types: Cigarettes    Quit date: 02/11/1988  . Smokeless tobacco: Never Used     Comment: QUIT late 70s/early 58s  . Alcohol Use: 0.0 oz/week    0 Standard drinks or equivalent per week     Comment: 0-3 daily  . Drug Use: No  . Sexual Activity: Not on file   Other Topics Concern  . Not on file   Social History Narrative   Lives in [address] at home with wife    Married in [place] from previous marriage.   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 Elder Cyphers   Drinks caffeine: depending on day 0-3 cups      PHYSICAL EXAM  Filed Vitals:   11/29/14 0837  BP: 151/98  Pulse: 78  Height: 5\' 10"  (1.778 m)  Weight: 244 lb (110.678 kg)    Body mass index is 35.01 kg/(m^2).  No exam data present  No flowsheet data found.  GENERAL EXAM: Patient is in no distress; well developed, nourished and groomed; neck is supple; GOOD MOOD  CARDIOVASCULAR: Regular rate and rhythm, no murmurs, no carotid bruits  NEUROLOGIC: MENTAL STATUS: awake, alert, language fluent, comprehension intact, naming intact, fund of knowledge appropriate;  CRANIAL NERVE: pupils equal and reactive to light, DECR VISION IN RIGHT EYE, visual fields full to confrontation, extraocular muscles intact, no nystagmus, facial sensation and strength symmetric, hearing intact, palate elevates symmetrically, uvula midline, shoulder shrug symmetric, tongue midline. MOTOR: normal bulk and tone, full strength in the BUE, BLE; EXCEPT DECR IN RUE DUE TO RIGHT  SHOULDER PAIN SENSORY: normal and symmetric to light touch; DECR VIB AT TOES (5 sec) COORDINATION: finger-nose-finger, fine finger movements normal REFLEXES: BUE 1, BLE 0 GAIT/STATION: narrow based gait; ANTALGIC GAIT     DIAGNOSTIC DATA (LABS, IMAGING, TESTING) - I reviewed patient records, labs, notes, testing and imaging myself where available.  Lab Results  Component Value Date   WBC 6.1 01/27/2014   HGB 15.4 01/27/2014   HCT 46.1 01/27/2014   MCV 91.2 01/27/2014   PLT 196.0 01/27/2014      Component Value Date/Time   NA 139 03/23/2014 0817   K 4.0 03/23/2014 0817   CL 105 03/23/2014 0817   CO2 29 03/23/2014 0817   GLUCOSE 102* 03/23/2014 0817   BUN 15 03/23/2014 0817   CREATININE 0.82 03/23/2014 0817   CREATININE 0.89 06/09/2013 1604   CALCIUM 9.0 03/23/2014 0817   PROT 6.9 01/27/2014 0734   ALBUMIN 4.3 01/27/2014 0734   AST 24 01/27/2014 0734   ALT 20 01/27/2014 0734   ALKPHOS 56 01/27/2014 0734   BILITOT 1.3* 01/27/2014 0734   GFRNONAA 83* 06/23/2013 0316   GFRAA >90 06/23/2013 0316   Lab Results  Component Value Date   CHOL 157 01/27/2014   HDL 44.00 01/27/2014   LDLCALC 89 01/27/2014   TRIG 121.0 01/27/2014   CHOLHDL 4 01/27/2014   Lab Results  Component Value Date   HGBA1C 5.3 03/07/2012   Lab Results  Component Value Date   [id number] 509 08/29/2011   Lab Results  Component Value Date   TSH 0.993 02/07/2013    10/15/10 EMG/NCS ([facility]) - Nerve conduction studies done on both upper extremities were unremarkable, without evidence of a neuropathy or carpal tunnel syndrome on either side. EMG  study of the right upper extremity shows mild chronic stable changes consistent with a C6 radiculopathy. EMG study of the left upper extremity was unremarkable. There is no evidence of an acute cervical radiculopathy on either side.     ASSESSMENT AND PLAN  74 y.o. year old male here with:  - Peripheral neuropathy (? paraneoplastic from lung lesion) vs  lumbar radiculopathy - Right shoulder pain - Right wrist pain - Fibromuscular dysplasia with history of dissection of the left internal carotid artery and right renal artery - Status post cervical fusion - Cardiomyopathy   PLAN: I spent 15 minutes of face to face time with patient. Greater than 50% of time was spent in counseling and coordination of care with patient. In summary we discussed:  - continue neuropathy cream  - continue duloxetine - cane or rollator walker for balance  Return in about 1 year (around 11/29/2015).     Penni Bombard, MD 61/44/3154, 0:08 AM Certified in Neurology, Neurophysiology and Neuroimaging  [facility] [address], [place], [place] [address] [phone]

## 2014-12-02 ENCOUNTER — Other Ambulatory Visit: Payer: Self-pay | Admitting: Internal Medicine

## 2014-12-05 ENCOUNTER — Telehealth: Payer: Self-pay | Admitting: Internal Medicine

## 2014-12-05 NOTE — Telephone Encounter (Signed)
lmtcb x1 

## 2014-12-06 NOTE — Telephone Encounter (Signed)
lmtcb x2 

## 2014-12-07 NOTE — Telephone Encounter (Signed)
[facility] and spoke with Collier Salina a pharm. Needing to clarify pt is on advair and not symbicort. Advised this is correct. Nothing further needed

## 2014-12-12 ENCOUNTER — Ambulatory Visit (INDEPENDENT_AMBULATORY_CARE_PROVIDER_SITE_OTHER): Payer: Medicare Other | Admitting: Cardiology

## 2014-12-12 ENCOUNTER — Encounter: Payer: Self-pay | Admitting: Cardiology

## 2014-12-12 VITALS — BP 130/90 | HR 83 | Ht 70.0 in | Wt 241.1 lb

## 2014-12-12 DIAGNOSIS — I428 Other cardiomyopathies: Secondary | ICD-10-CM

## 2014-12-12 DIAGNOSIS — I493 Ventricular premature depolarization: Secondary | ICD-10-CM | POA: Diagnosis not present

## 2014-12-12 DIAGNOSIS — E785 Hyperlipidemia, unspecified: Secondary | ICD-10-CM | POA: Diagnosis not present

## 2014-12-12 DIAGNOSIS — I429 Cardiomyopathy, unspecified: Secondary | ICD-10-CM

## 2014-12-12 NOTE — Patient Instructions (Signed)
Your physician recommends that you continue on your current medications as directed. Please refer to the Current Medication list given to you today.    Your physician recommends that you schedule a follow-up appointment in: AS NEEDED WITH DR NELSON  

## 2014-12-12 NOTE — Progress Notes (Signed)
Patient ID: Ameir Faria, male   DOB: 26-Oct-1940, 74 y.o.   MRN: [id number]    Patient Name: [name] [name] Date of Encounter: 12/12/2014  Primary Care Provider:  Kennon Portela, MD Primary Cardiologist:  Dorothy Spark  Problem List   Past Medical History  Diagnosis Date  . HTN (hypertension)   . PVC (premature ventricular contraction)   . History of agent Orange exposure   . Retinopathy     related to PVC's  . Dissection of carotid artery ([place])     history  . Dissection of renal artery ([facility])     history  . GERD (gastroesophageal reflux disease)   . History of hepatitis 1969    [place], unknown what type  . Anal fissure   . Anxiety disorder   . Arthritis   . Asthma   . Adenomatous colon polyp   . Diverticulosis   . Gallstones   . Cardiomyopathy     EF of 40-45%   Past Surgical History  Procedure Laterality Date  . Appendectomy  1963  . Cervical fusion  1985  . Cholecystectomy  1990  . Tonsillectomy  1965  . Hernia repair    . Carotid disection    . Right renal artery disection    . Nose surgery    . Vasectomy    . Total shoulder replacement      Right   Allergies  Allergies  Allergen Reactions  . Shrimp [Shellfish Allergy] Anaphylaxis  . Contrast Media [Iodinated Diagnostic Agents] Other (See Comments)    Skin reaction-not sure exactly-per MD  . Metoprolol Other (See Comments)    Causes BP issues, heart palpatiions  . Morphine Hives  . Oxycodone-Acetaminophen Nausea And Vomiting  . Penicillins Swelling and Rash    HPI  Mr Hodsdon is a prior patient of Dr. wall. He is coming after one year for a followup.  The patient has a history of nonischemic cardiomyopathy with ejection fraction of 40-45%, history of hypertension, history of spontaneous dissection of his carotid artery as well as his renal artery, history of symptomatic PVCs which used to cause chest pain but not palpitations. He was started on diltiazem by Dr. Caryl Comes with significant  improvement of his symptoms.  The patient was seen a year ago by Dr. wall for preop evaluation for complex right shoulder surgery. As a part of his preop evaluation for lung nodules was found in his right lung. This was followed 3 months later with mild enlargement of the nodule, and patient is currently waiting for another CT scan as a year followup. Because of this issue he's shoulder surgery has been postponed for now.  The patient denies symptoms of angina, shortness of breath, dizziness palpitations or syncope. He also denies symptoms of orthopnea paroxysmal nocturnal dyspnea or lower extremity swelling. He complains of tingling in his lower extremities consistent with neuropathy.  01/19/2014 - patient is coming after one year, he states that his lisinopril was discontinued because of severe hypotension down to 70s. He denies any chest pain, is stable dyspnea on moderate exertion. He is not extremity edema. He does feel occasional palpitations but no syncope. Has been compliant with his meds.  04/05/2014 - the patient is coming after 3 months, he states that he is feeling well he has stable dyspnea on exertion that he attributes to his asthma which he takes Symbicort daily. In the meantime he was diagnosed with glaucoma and is undergoing treatment. The patient underwent transthoracic echocardiography that showed  left ventricular ejection fraction of 45 to 50% and he was started on losartan 25 mg orally daily. He had follow-up labs and his creatinine and potassium remained normal.  10/03/14 - 6 months follow up, his most recent echo shows worsening LVEF now 35-40% with diffuse hypokinesis. The patient noticed minimal worsening DOE, and more palpitations, no syncope, no chest pain, LE edema, PND.  12/12/2014 - the patient is coming two months, he was found to have bigeminy after every beat and scheduled for Holter monitor as well as a Lexiscan stress test to rule out ischemia. His verapamil was  switched to Toprol XL forworsening LVEF. Today he states that because he had left carotid dissection and right renal artery dissection in the past he was worrying that these tests can harm him. He also didn't take Toprol as metoprolol made him feel worse in the past.    Home Medications  Prior to Admission medications   Medication Sig Start Date End Date Taking? Authorizing Provider  albuterol (PROAIR HFA) 108 (90 BASE) MCG/ACT inhaler Inhale 2 puffs into the lungs every 4 (four) hours as needed for wheezing. 10/04/12  Yes Orma Flaming, MD  ALPRAZolam Duanne Moron) 0.25 MG tablet Take 0.25 mg by mouth 3 (three) times daily as needed.    Yes Historical Provider, MD  Ascorbic Acid (VITAMIN C) 1000 MG tablet Take 1,000 mg by mouth daily.     Yes Historical Provider, MD  aspirin 325 MG EC tablet Take 162.5 mg by mouth daily.    Yes Historical Provider, MD  celecoxib (CELEBREX) 200 MG capsule Take 200 mg by mouth every other day.  10/24/11  Yes Heather M Marte, PA-C  chlorthalidone (HYGROTON) 25 MG tablet Take 1 tablet (25 mg total) by mouth daily. 03/07/12  Yes Orma Flaming, MD  cholecalciferol (VITAMIN D) 1000 UNITS tablet Take 2,000 Units by mouth daily.    Yes Historical Provider, MD  fluticasone (FLONASE) 50 MCG/ACT nasal spray Place 2 sprays into the nose daily. 06/01/12  Yes Orma Flaming, MD  gabapentin (NEURONTIN) 600 MG tablet Take one tab po bid and 2 tabs po at night 06/04/12  Yes Kathrynn Ducking, MD  HYDROcodone-acetaminophen (NORCO/VICODIN) 5-325 MG per tablet Take 1 tablet by mouth every 8 (eight) hours as needed. 06/01/12  Yes Orma Flaming, MD  lidocaine (LIDODERM) 5 % Apply one half patch to each foot and one half patch to one shoulder in the evening. 06/04/12  Yes Kathrynn Ducking, MD  lisinopril (PRINIVIL,ZESTRIL) 20 MG tablet Take 1 tablet (20 mg total) by mouth 2 (two) times daily. PATIENT NEEDS OFFICE VISIT FOR ADDITIONAL REFILLS - 2nd NOTICE 12/14/12  Yes Orma Flaming, MD  predniSONE  (DELTASONE) 10 MG tablet Take 1 po pc bid for up to 5 days prn severe joint pain 06/01/12  Yes Orma Flaming, MD  trolamine salicylate (ASPERCREME) 10 % cream Apply 1 application topically 4 (four) times daily. For pain   Yes Historical Provider, MD  verapamil (CALAN-SR) 120 MG CR tablet Take 1 tablet (120 mg total) by mouth daily. 02/26/12  Yes Renella Cunas, MD  vitamin B-12 (CYANOCOBALAMIN) 100 MCG tablet Take 50 mcg by mouth daily.   Yes Historical Provider, MD    Family History  Family History  Problem Relation Age of Onset  . Heart disease Father   . Colon cancer      uncle    Social History  Social History   Social History  .  Marital Status: Married    Spouse Name: Santiago Glad  . Number of Children: 3  . Years of Education: 16   Occupational History  . ex marine   . retired     Equities trader   Social History Main Topics  . Smoking status: Former Smoker -- 0.50 packs/day for 15 years    Types: Cigarettes    Quit date: 02/11/1988  . Smokeless tobacco: Never Used     Comment: QUIT late 70s/early 55s  . Alcohol Use: 0.0 oz/week    0 Standard drinks or equivalent per week     Comment: 0-3 daily  . Drug Use: No  . Sexual Activity: Not on file   Other Topics Concern  . Not on file   Social History Narrative   Lives in [place] at home with wife    Married in [place] from previous marriage.   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 Elder Cyphers   Drinks caffeine: depending on day 0-3 cups      Review of Systems, as per HPI, otherwise negative General:  No chills, fever, night sweats or weight changes.  Cardiovascular:  No chest pain, dyspnea on exertion, edema, orthopnea, palpitations, paroxysmal nocturnal dyspnea. Dermatological: No rash, lesions/masses Respiratory: No cough, dyspnea Urologic: No hematuria, dysuria Abdominal:   No nausea, vomiting, diarrhea, bright red blood per rectum, melena, or  hematemesis Neurologic:  No visual changes, wkns, changes in mental status. All other systems reviewed and are otherwise negative except as noted above.  Physical Exam 122/82 mmHg, 79 BPM, W 245 lbs General: Pleasant, NAD Psych: Normal affect. Neuro: Alert and oriented X 3. Moves all extremities spontaneously. HEENT: Normal  Neck: Supple without bruits or JVD. Lungs:  Resp regular and unlabored, CTA. Heart: RRR no s3, s4, or murmurs. Abdomen: Soft, non-tender, non-distended, BS + x 4.  Extremities: No clubbing, cyanosis or edema. DP/PT/Radials 2+ and equal bilaterally.  Labs:  No results for input(s): CKTOTAL, CKMB, TROPONINI in the last 72 hours. Lab Results  Component Value Date   WBC 6.1 01/27/2014   HGB 15.4 01/27/2014   HCT 46.1 01/27/2014   MCV 91.2 01/27/2014   PLT 196.0 01/27/2014   No results for input(s): NA, K, CL, CO2, BUN, CREATININE, CALCIUM, PROT, BILITOT, ALKPHOS, ALT, AST, GLUCOSE in the last 168 hours.  Invalid input(s): LABALBU Lab Results  Component Value Date   CHOL 157 01/27/2014   HDL 44.00 01/27/2014   LDLCALC 89 01/27/2014   TRIG 121.0 01/27/2014   Lab Results  Component Value Date   DDIMER 0.61* 06/21/2013   Invalid input(s): POCBNP  Accessory Clinical Findings  Echocardiogram - 12/17/2010 Left ventricle: The cavity size was normal. Wall thickness was increased in a pattern of mild LVH. Systolic function was mildly to moderately reduced. The estimated ejection fraction was in the range of 40% to 45%. Diffuse hypokinesis. Doppler parameters are consistent with abnormal left ventricular relaxation (grade 1 diastolic dysfunction). - Aortic valve: Transvalvular velocity was within the normal range. There was no stenosis. - Left atrium: The atrium was mildly dilated. - Right ventricle: The cavity size was normal. Systolic function was normal. - Right atrium: The atrium was mildly dilated. - Pulmonary arteries: PA peak  pressure: 7mm Hg (S). - Inferior vena cava: The vessel was normal in size; the respirophasic diameter changes were in the normal range (= 50%); findings are consistent with normal central venous pressure. Impressions:  -  Normal LV size with mild LV hypertrophy. EF 40-45% with global hypokinesis. Normal RV size and systolic function. No significant valvular abnormalities.  Echocardogram - 01/25/2014 Left ventricle: The cavity size was normal. Wall thickness was normal. Systolic function was mildly reduced. The estimated ejection fraction was in the range of 45% to 50%. Diffuse hypokinesis. Doppler parameters are consistent with abnormal left ventricular relaxation (grade 1 diastolic dysfunction). - Aortic valve: There was trivial regurgitation. - Left atrium: The atrium was mildly dilated  ECG - sinus rhythm, occasional PVCs, 76 beats per minute otherwise normal EKG.   TTE: 09/26/2014 Study Conclusions  - Left ventricle: The cavity size was normal. Wall thickness was normal. Systolic function was moderately reduced. The estimated ejection fraction was in the range of 35% to 40%. Diffuse hypokinesis. Doppler parameters are consistent with abnormal left ventricular relaxation (grade 1 diastolic dysfunction). - Right ventricle: Systolic function was mildly reduced.  Impressions: - Moderate global reduction in LV function; grade 1 diastolic dysfunction; mildly reduced RV function.     Assessment & Plan  1. Nonischemic cardiomyopathy - worsening LVEF now 35-40%, previously 45-50% started on losartan 6 months ago. His ECG shows bigeminy after every beat. I am suspicious that this is a presentation of frequent ectopy induced cardiomyopathy. He was diagnosed with PVCs years ago and started on verapamil, he has recently noticed more frequent palpitations.  - order 24 Holter monitor to assess PVC burden - switch verapamil to Toprol XL 25 mg po daily for  low EF - schedule a stress test to rule out ischemia  The patient would like to avoid a stress test as he wouldn't undergo any invasive test like cath anyway. I think that this is very reasonable. However, I am not sure if he understands what is going on as I tried to explain that obtaining a Holter is a non-invasive test that would help [place] direct antiarrhythmic therapies (medication only) in order to prevent further LV function decrease.  He keeps saying that he doesn't want that and talks about pacemaker that was not mentioned previously.  He wants to discuss with his physicians friends.  He followed with Dr Caryl Comes in the past, I would refer to him in the future is he decides to follow.  2. Symptomatic PVCs - as above  3. Hypertension - controlled after starting losartan.  4. Lipid profile - checked in December 2015, triglyceride 121 HDL 44 LDL 89, this is at goal and be no need to start therapy.    Dorothy Spark, MD, [facility] 12/12/2014, 8:22 AM

## 2015-01-12 DIAGNOSIS — R351 Nocturia: Secondary | ICD-10-CM | POA: Diagnosis not present

## 2015-01-12 DIAGNOSIS — N4340 Spermatocele of epididymis, unspecified: Secondary | ICD-10-CM | POA: Diagnosis not present

## 2015-01-12 DIAGNOSIS — R972 Elevated prostate specific antigen [PSA]: Secondary | ICD-10-CM | POA: Diagnosis not present

## 2015-01-12 DIAGNOSIS — N401 Enlarged prostate with lower urinary tract symptoms: Secondary | ICD-10-CM | POA: Diagnosis not present

## 2015-01-19 ENCOUNTER — Other Ambulatory Visit: Payer: Self-pay | Admitting: Internal Medicine

## 2015-01-29 ENCOUNTER — Ambulatory Visit (INDEPENDENT_AMBULATORY_CARE_PROVIDER_SITE_OTHER): Payer: Medicare Other | Admitting: Internal Medicine

## 2015-01-29 ENCOUNTER — Encounter: Payer: Self-pay | Admitting: Internal Medicine

## 2015-01-29 VITALS — BP 156/84 | HR 85 | Temp 97.5°F | Resp 16 | Ht 70.0 in | Wt 241.0 lb

## 2015-01-29 DIAGNOSIS — Z23 Encounter for immunization: Secondary | ICD-10-CM

## 2015-01-29 DIAGNOSIS — J302 Other seasonal allergic rhinitis: Secondary | ICD-10-CM

## 2015-01-29 DIAGNOSIS — Z7189 Other specified counseling: Secondary | ICD-10-CM | POA: Diagnosis not present

## 2015-01-29 MED ORDER — FLUTICASONE PROPIONATE 50 MCG/ACT NA SUSP: NASAL | Status: DC

## 2015-01-29 NOTE — Patient Instructions (Addendum)
Allergic Rhinitis Allergic rhinitis is when the mucous membranes in the nose respond to allergens. Allergens are particles in the air that cause your body to have an allergic reaction. This causes you to release allergic antibodies. Through a chain of events, these eventually cause you to release histamine into the blood stream. Although meant to protect the body, it is this release of histamine that causes your discomfort, such as frequent sneezing, congestion, and an itchy, runny nose.  CAUSES Seasonal allergic rhinitis (hay fever) is caused by pollen allergens that may come from grasses, trees, and weeds. Year-round allergic rhinitis (perennial allergic rhinitis) is caused by allergens such as house dust mites, pet dander, and mold spores. SYMPTOMS  Nasal stuffiness (congestion).  Itchy, runny nose with sneezing and tearing of the eyes. DIAGNOSIS Your health care provider can help you determine the allergen or allergens that trigger your symptoms. If you and your health care provider are unable to determine the allergen, skin or blood testing may be used. Your health care provider will diagnose your condition after taking your health history and performing a physical exam. Your health care provider may assess you for other related conditions, such as asthma, pink eye, or an ear infection. TREATMENT Allergic rhinitis does not have a cure, but it can be controlled by:  Medicines that block allergy symptoms. These may include allergy shots, nasal sprays, and oral antihistamines.  Avoiding the allergen. Hay fever may often be treated with antihistamines in pill or nasal spray forms. Antihistamines block the effects of histamine. There are over-the-counter medicines that may help with nasal congestion and swelling around the eyes. Check with your health care provider before taking or giving this medicine. If avoiding the allergen or the medicine prescribed do not work, there are many new medicines  your health care provider can prescribe. Stronger medicine may be used if initial measures are ineffective. Desensitizing injections can be used if medicine and avoidance does not work. Desensitization is when a patient is given ongoing shots until the body becomes less sensitive to the allergen. Make sure you follow up with your health care provider if problems continue. HOME CARE INSTRUCTIONS It is not possible to completely avoid allergens, but you can reduce your symptoms by taking steps to limit your exposure to them. It helps to know exactly what you are allergic to so that you can avoid your specific triggers. SEEK MEDICAL CARE IF:  You have a fever.  You develop a cough that does not stop easily (persistent).  You have shortness of breath.  You start wheezing.  Symptoms interfere with normal daily activities.   This information is not intended to replace advice given to you by your health care provider. Make sure you discuss any questions you have with your health care provider.   Document Released: 10/22/2000 Document Revised: 02/17/2014 Document Reviewed: 10/04/2012 Elsevier Interactive Patient Education 2016 [place] Allergy A seafood allergy is when you have a reaction to fish or shellfish. When you have an allergy, your body's defense system (immune system) overreacts to a substance that is normally not harmful (allergen). If you are allergic to seafood, your body reacts to fish or shellfish as though these were dangerous substances. In some cases, a fish or shellfish allergy can cause a severe, life-threatening reaction that may make it hard to breathe (anaphylaxis). A shellfish allergy is one of the most common types of allergy. Certain types of shellfish are more likely to cause a reaction. These include crab,  lobster, and shrimp. A shellfish allergy often does not start until the person is an adult. It is less common to have an allergy to finned fish, such as  tuna, halibut, or salmon. A fish allergy also often does not start until the person is an adult. Being allergic to shellfish does not mean you will be allergic to finned fish. The opposite is also true. You might have just one of these allergies, or you might be allergic to both fish and shellfish. CAUSES  A seafood allergy is caused by your immune system's overreaction to a protein in seafood. Allergy symptoms are caused when your immune system releases chemicals in response to that protein. RISK FACTORS You may be at greater risk for a seafood allergy if you already have another type of allergy. SIGNS AND SYMPTOMS  Symptoms of a shellfish allergy include:  Itching or swelling around your mouth.  Wheezing.  Coughing.  Feeling that your throat is tight.  Trouble swallowing.  Hives.  Stomach cramps.  Vomiting.  Diarrhea.  A weak pulse.  Turning pale.  Feeling confused or dizzy. Symptoms of a fish allergy include:  Skin rashes or hives.  Stomach cramps.  Nausea and vomiting.  Diarrhea.  Nasal congestion.  Sneezing.  Headaches.  Asthma.  Trouble breathing.  Feeling confused or dizzy. DIAGNOSIS  Your health care provider may suspect a seafood allergy based on your signs and symptoms. A physical exam will be done. You may have to see a health care provider who specializes in treating allergies (allergist). This health care provider may order tests to help confirm the diagnosis. These may include:  Blood tests.  Blood tests may be used to check for antibodies to the fish or shellfish being tested. Antibodies are proteins that your body produces to protect you from germs and other things that can make you sick.  Skin prick tests.  In this test, a small amount of a liquid containing the allergy-causing substance is put on your arm.  This spot is then pricked with a germ-free (sterile) needle so some of the liquid can get into your skin.  If a reddish spot  appears after about 15 minutes, it could mean you are allergic to that substance.  An oral food challenge.  This is a supervised test to see how your body responds to small amounts of foods that could be causing your symptoms. TREATMENT The main treatment for a seafood allergy is to avoid the foods you are allergic to. Treatment can also include:  Taking medicine (antihistamines or corticosteroids) to ease symptoms.  Having an emergency treatment plan that includes injecting epinephrine if anaphylaxis occurs.  Your health care provider will give you an emergency plan and teach you how to use the epinephrine injector. HOME CARE INSTRUCTIONS  Do not eat the shellfish or fish that you are allergic to.  Read food labels carefully. Fish and shellfish can be ingredients in sauces, broths, and other products.  When you eat out, let your server know that you have a food allergy. Ask how foods are prepared.  Take medicines only as directed by your health care provider.  Make sure you understand your emergency treatment plan.  Make sure you know how to use the epinephrine injector.  Always carry two doses with you in case of an anaphylactic emergency. Make sure they have not expired. SEEK MEDICAL CARE IF: You continue to have symptoms after treatment. SEEK IMMEDIATE MEDICAL CARE IF:  You have trouble breathing.  You  accidentally ate some fish or shellfish that you are allergic to.  You used your epinephrine injector. You must seek emergency care as soon as possible after using the injector. MAKE SURE YOU:  Understand these instructions.  Will watch your condition.  Will get help right away if you are not doing well or get worse.   This information is not intended to replace advice given to you by your health care provider. Make sure you discuss any questions you have with your health care provider.   Document Released: 07/19/2001 Document Revised: 02/17/2014 Document Reviewed:  05/13/2013 Elsevier Interactive Patient Education [employer].

## 2015-01-29 NOTE — Progress Notes (Signed)
Patient ID: [name] [name], male   DOB: 07-25-1940, 74 y.o.   MRN: [id number]   01/29/2015 at 10:56 AM  [name] [name] / DOB: May 14, 1940 / MRN: [id number]  Problem list reviewed and updated by me where necessary.   SUBJECTIVE  [name] [name] is a 74 y.o. well appearing male presenting for the chief complaint of need refills.Marland Kitchen His heart and BP issues are doing better than ever. His allergys and asthma are doing reasonably well. He needs refills of fluticasone. He [name] establish with a primary  Care new doctor soon.  He does need Prvnar and flu vaccine today.  He  has a past medical history of HTN (hypertension); PVC (premature ventricular contraction); History of agent Orange exposure; Retinopathy; Dissection of carotid artery ([place]); Dissection of renal artery ([facility]); GERD (gastroesophageal reflux disease); History of hepatitis (1969); Anal fissure; Anxiety disorder; Arthritis; Asthma; Adenomatous colon polyp; Diverticulosis; Gallstones; and Cardiomyopathy.    Medications reviewed and updated by myself where necessary, and exist elsewhere in the encounter.   [name] [name] is allergic to shrimp; contrast media; metoprolol; morphine; oxycodone-acetaminophen; and penicillins. He  reports that he quit smoking about 26 years ago. His smoking use included Cigarettes. He has a 7.5 pack-year smoking history. He has never used smokeless tobacco. He reports that he drinks alcohol. He reports that he does not use illicit drugs. He  has no sexual activity history on file. The patient  has past surgical history that includes Appendectomy (1963); Cervical fusion (1985); Cholecystectomy (1990); Tonsillectomy (1965); Hernia repair; carotid disection; right renal artery disection; Nose surgery; Vasectomy; and Total shoulder replacement.  His family history includes Heart disease in his father.  Review of Systems  Constitutional: Negative for fever.  Respiratory: Negative for shortness of breath.   Cardiovascular:  Negative for chest pain.  Gastrointestinal: Negative for nausea.  Skin: Negative for rash.  Neurological: Negative for dizziness and headaches.    OBJECTIVE  His  height is 5\' 10"  (1.778 m) and weight is 241 lb (109.317 kg). His temperature is 97.5 F (36.4 C). His blood pressure is 156/84 and his pulse is 85. His respiration is 16 and oxygen saturation is 97%.  The patient's body mass index is 34.58 kg/(m^2).  Physical Exam  Constitutional: He is oriented to person, place, and time. He appears well-developed and well-nourished. No distress.  HENT:  Head: Normocephalic.  Nose: Nose normal.  Eyes: Conjunctivae and EOM are normal.  Cardiovascular: Normal rate, regular rhythm, normal heart sounds and intact distal pulses.  Exam reveals no gallop and no friction rub.   No murmur heard. Respiratory: Effort normal and breath sounds normal.  Musculoskeletal: He exhibits tenderness.  Neurological: He is alert and oriented to person, place, and time. He exhibits normal muscle tone. Coordination normal.  Psychiatric: He has a normal mood and affect.    No results found for this or any previous visit (from the past 24 hour(s)).  ASSESSMENT & PLAN  [name] [name] was seen today for other.  Diagnoses and all orders for this visit:  Need for prophylactic vaccination and inoculation against influenza -     Flu Vaccine QUAD 36+ mos IM  Need for pneumococcal vaccination -     Pneumococcal conjugate vaccine 13-valent IM  Other orders -     fluticasone (FLONASE) 50 MCG/ACT nasal spray; USE 2 SPRAYS IN EACH NOSTRIL DAILY (NO MORE REFILLS WITHOUT OFFICE VISIT)

## 2015-02-11 HISTORY — PX: OTHER SURGICAL HISTORY: SHX169

## 2015-02-23 DIAGNOSIS — L723 Sebaceous cyst: Secondary | ICD-10-CM | POA: Diagnosis not present

## 2015-02-23 DIAGNOSIS — D225 Melanocytic nevi of trunk: Secondary | ICD-10-CM | POA: Diagnosis not present

## 2015-04-06 ENCOUNTER — Other Ambulatory Visit: Payer: Self-pay | Admitting: Cardiology

## 2015-04-06 NOTE — Telephone Encounter (Signed)
See Assessment & Plan on last OV with Dr. Meda Coffee 12/12/14. Should pt be on Metoprolol or Verapamil? Please advise.  Assessment & Plan  1. Nonischemic cardiomyopathy - worsening LVEF now 35-40%, previously 45-50% started on losartan 6 months ago. His ECG shows bigeminy after every beat. I am suspicious that this is a presentation of frequent ectopy induced cardiomyopathy. He was diagnosed with PVCs years ago and started on verapamil, he has recently noticed more frequent palpitations.  - order 24 Holter monitor to assess PVC burden - switch verapamil to Toprol XL 25 mg po daily for low EF - schedule a stress test to rule out ischemia  The patient would like to avoid a stress test as he wouldn't undergo any invasive test like cath anyway. I think that this is very reasonable. However, I am not sure if he understands what is going on as I tried to explain that obtaining a Holter is a non-invasive test that would help [place] direct antiarrhythmic therapies (medication only) in order to prevent further LV function decrease.  He keeps saying that he doesn't want that and talks about pacemaker that was not mentioned previously.  He wants to discuss with his physicians friends.  He followed with Dr Caryl Comes in the past, I would refer to him in the future is he decides to follow.  2. Symptomatic PVCs - as above  3. Hypertension - controlled after starting losartan.  4. Lipid profile - checked in December 2015, triglyceride 121 HDL 44 LDL 89, this is at goal and be no need to start therapy.    Dorothy Spark, MD, [facility] 12/12/2014, 8:22 AM

## 2015-05-15 ENCOUNTER — Other Ambulatory Visit: Payer: Self-pay | Admitting: Internal Medicine

## 2015-05-22 ENCOUNTER — Ambulatory Visit (INDEPENDENT_AMBULATORY_CARE_PROVIDER_SITE_OTHER): Payer: Medicare Other | Admitting: Emergency Medicine

## 2015-05-22 VITALS — BP 132/88 | HR 84 | Temp 97.8°F | Resp 16 | Ht 70.0 in | Wt 243.0 lb

## 2015-05-22 DIAGNOSIS — J302 Other seasonal allergic rhinitis: Secondary | ICD-10-CM | POA: Diagnosis not present

## 2015-05-22 DIAGNOSIS — J4520 Mild intermittent asthma, uncomplicated: Secondary | ICD-10-CM

## 2015-05-22 DIAGNOSIS — Z7189 Other specified counseling: Secondary | ICD-10-CM | POA: Diagnosis not present

## 2015-05-22 MED ORDER — FLUTICASONE-SALMETEROL 115-21 MCG/ACT IN AERO: 2.0000 | INHALATION_SPRAY | Freq: Two times a day (BID) | RESPIRATORY_TRACT | Status: DC

## 2015-05-22 MED ORDER — FLUTICASONE PROPIONATE HFA 220 MCG/ACT IN AERO: 2.0000 | INHALATION_SPRAY | Freq: Two times a day (BID) | RESPIRATORY_TRACT | Status: DC

## 2015-05-22 MED ORDER — FLUTICASONE PROPIONATE 50 MCG/ACT NA SUSP: NASAL | Status: DC

## 2015-05-22 MED ORDER — ALBUTEROL SULFATE (2.5 MG/3ML) 0.083% IN NEBU: 2.5000 mg | INHALATION_SOLUTION | Freq: Four times a day (QID) | RESPIRATORY_TRACT | Status: DC | PRN

## 2015-05-22 NOTE — Patient Instructions (Signed)
     IF you received an x-ray today, you will receive an invoice from [facility]. Please contact [facility] at [phone] with questions or concerns regarding your invoice.   IF you received labwork today, you will receive an invoice from [facility]. Please contact [facility] at [phone] with questions or concerns regarding your invoice.   Our billing staff will not be able to assist you with questions regarding bills from these companies.  You will be contacted with the lab results as soon as they are available. The fastest way to get your results is to activate your My Chart account. Instructions are located on the last page of this paperwork. If you have not heard from us regarding the results in 2 weeks, please contact this office.      

## 2015-05-22 NOTE — Progress Notes (Signed)
By signing my name below, I, [name] [name], attest that this documentation has been prepared under the direction and in the presence of [name] Queen, MD. Electronically Signed: Moises [name], [place]. 05/22/2015 , 8:35 AM .  Patient was seen in room 10 .  Chief Complaint:  Chief Complaint  Patient presents with  . Medication Refill    Fluticasone, advair    HPI: [name] [name] is a 75 y.o. male who reports to [facility] today requesting medication refill for advair, flonase and albuterol.   He goes to the [place] [facility] every 2-3 weeks for macular degeneration and glaucoma. He used to receive 10-11 injections done in [place].   He also has history of dissection of carotid artery and dissection of renal artery.   One of his daughters is having brain surgery done at [facility] in a week.   Past Medical History  Diagnosis Date  . HTN (hypertension)   . PVC (premature ventricular contraction)   . History of agent Orange exposure   . Retinopathy     related to PVC's  . Dissection of carotid artery ([place])     history  . Dissection of renal artery ([facility])     history  . GERD (gastroesophageal reflux disease)   . History of hepatitis 1969    [place], unknown what type  . Anal fissure   . Anxiety disorder   . Arthritis   . Asthma   . Adenomatous colon polyp   . Diverticulosis   . Gallstones   . Cardiomyopathy     EF of 40-45%   Past Surgical History  Procedure Laterality Date  . Appendectomy  1963  . Cervical fusion  1985  . Cholecystectomy  1990  . Tonsillectomy  1965  . Hernia repair    . Carotid disection    . Right renal artery disection    . Nose surgery    . Vasectomy    . Total shoulder replacement      Right   Social History   Social History  . Marital Status: Married    Spouse Name: [name] [name]  . Number of Children: 3  . Years of Education: 16   Occupational History  . ex marine   . retired     Equities trader   Social History Main Topics    . Smoking status: Former Smoker -- 0.50 packs/day for 15 years    Types: Cigarettes    Quit date: 02/11/1988  . Smokeless tobacco: Never Used     Comment: QUIT late 70s/early 10s  . Alcohol Use: 0.0 oz/week    0 Standard drinks or equivalent per week     Comment: 0-3 daily  . Drug Use: No  . Sexual Activity: Not Asked   Other Topics Concern  . None   Social History Narrative   Lives in [place] at home with wife    Married in [place] from previous marriage.   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 Elder [name]   Drinks caffeine: depending on day 0-3 cups    Family History  Problem Relation Age of Onset  . Heart disease Father   . Colon cancer      uncle   Allergies  Allergen Reactions  . Shrimp [Shellfish Allergy] Anaphylaxis  . Contrast Media [Iodinated Diagnostic Agents] Other (See Comments)    Skin reaction-not sure exactly-per MD  . Metoprolol Other (See Comments)  Causes BP issues, heart palpatiions  . Morphine Hives  . Oxycodone-Acetaminophen Nausea And Vomiting  . Penicillins Swelling and Rash   Prior to Admission medications   Medication Sig Start Date End Date Taking? Authorizing Provider  Ascorbic Acid (VITAMIN C) 1000 MG tablet Take 1,000 mg by mouth daily.      Historical Provider, MD  aspirin 325 MG EC tablet Take 162.5 mg by mouth daily.     Historical Provider, MD  B Complex Vitamins (VITAMIN B COMPLEX) TABS Take 1 tablet by mouth daily. Reported on 01/29/2015    Historical Provider, MD  budesonide-formoterol (SYMBICORT) 160-4.5 MCG/ACT inhaler Inhale 2 puffs into the lungs 4 (four) times daily as needed. Reported on 01/29/2015    Historical Provider, MD  cholecalciferol (VITAMIN D) 1000 UNITS tablet Take 2,000 Units by mouth daily.     Historical Provider, MD  DULoxetine (CYMBALTA) 30 MG capsule Take 30 mg by mouth every other day.    Historical Provider, MD  fluticasone (FLONASE) 50 MCG/ACT nasal spray USE 2  SPRAYS IN EACH NOSTRIL DAILY (NO MORE REFILLS WITHOUT OFFICE VISIT) 01/29/15   Orma Flaming, MD  fluticasone-salmeterol (ADVAIR [facility]) [phone] MCG/ACT inhaler Inhale 2 puffs into the lungs 2 (two) times daily. 10/12/14   Tanda Rockers, MD  lidocaine (LIDODERM) 5 % APPLY ONE-HALF (1/2) PATCH TO EACH FOOT AND ONE-HALF (1/2) PATCH TO 1 SHOULDER IN THE EVENING. 08/16/14   Penni Bombard, MD  losartan (COZAAR) 25 MG tablet Take 12.5 mg by mouth daily.    Historical Provider, MD  Multiple Vitamins-Minerals (ONE-A-DAY 50 PLUS PO) Take 1 tablet by mouth daily.    Historical Provider, MD  Multiple Vitamins-Minerals (PRESERVISION AREDS 2) CAPS Take 1 capsule by mouth 2 (two) times daily.    Historical Provider, MD  nystatin-triamcinolone (MYCOLOG II) cream Apply 1 application topically 2 (two) times daily.  06/30/14   Historical Provider, MD  PROAIR HFA 108 (90 BASE) MCG/ACT inhaler INHALE 2 PUFFS INTO THE LUNGS EVERY 4 HOURS AS NEEDED FOR WHEEZING    Orma Flaming, MD  Turmeric 450 MG CAPS Take 1 capsule by mouth 2 (two) times daily. Reported on 01/29/2015    Historical Provider, MD  verapamil (CALAN-SR) 120 MG CR tablet Take 120 mg by mouth daily.  10/10/14   Historical Provider, MD  verapamil (CALAN-SR) 120 MG CR tablet TAKE 1 TABLET DAILY 04/10/15   Dorothy Spark, MD     ROS:  Constitutional: negative for fever, chills, night sweats, weight changes, or fatigue  HEENT: negative for vision changes, hearing loss, congestion, rhinorrhea, ST, epistaxis, or sinus pressure Cardiovascular: negative for chest pain or palpitations Respiratory: negative for hemoptysis, wheezing, shortness of breath, or cough Abdominal: negative for abdominal pain, nausea, vomiting, diarrhea, or constipation Dermatological: negative for rash Neurologic: negative for headache, dizziness, or syncope All other systems reviewed and are otherwise negative with the exception to those above and in the HPI.  PHYSICAL EXAM: Filed  Vitals:   05/22/15 0813  BP: 132/88  Pulse: 84  Temp: 97.8 F (36.6 C)  Resp: 16   Body mass index is 34.87 kg/(m^2).   General: Alert, no acute distress HEENT:  Normocephalic, atraumatic, oropharynx patent Neck: No carotid bruit Eye: EOMI, PEERLDC Cardiovascular:  Regular rate and rhythm, no rubs murmurs or gallops.  No Carotid bruits, radial pulse intact. No pedal edema.  Respiratory: Clear to auscultation bilaterally.  No wheezes, rales, or rhonchi.  No cyanosis, no use of accessory musculature  Abdominal: No organomegaly, abdomen is soft and non-tender, positive bowel sounds. No masses. Musculoskeletal: Gait intact. No edema, tenderness Skin: No rashes. Neurologic: Facial musculature symmetric. Psychiatric: Patient acts appropriately throughout our interaction.  Lymphatic: No cervical or submandibular lymphadenopathy Genitourinary/Anorectal: No acute findings   LABS:   EKG/XRAY:     ASSESSMENT/PLAN:  Patient going through a lot right now. He has macular degeneration and glaucoma and requires intraocular shots. He is in today requesting refills for his asthma. I transitioned him from Advair to fluticasone alone. We will see how he does with this. He has not had any asthma for over a year now. I did refill albuterol by Ferd Hibbs to keep on hand. He also was given a Flonase refill. He will continue follow-up at the [place].I personally performed the services described in this documentation, which was scribed in my presence. The recorded information has been reviewed and is accurate.  Gross sideeffects, risk and benefits, and alternatives of medications d/w patient. Patient is aware that all medications have potential sideeffects and we are unable to predict every sideeffect or drug-drug interaction that may occur.  [name] Queen MD 05/22/2015 8:35 AM

## 2015-05-24 ENCOUNTER — Other Ambulatory Visit: Payer: Self-pay | Admitting: Cardiology

## 2015-05-25 ENCOUNTER — Other Ambulatory Visit: Payer: Self-pay

## 2015-05-25 MED ORDER — FLUTICASONE PROPIONATE 50 MCG/ACT NA SUSP: NASAL | Status: DC

## 2015-05-25 MED ORDER — FLUTICASONE PROPIONATE HFA 220 MCG/ACT IN AERO: 2.0000 | INHALATION_SPRAY | Freq: Two times a day (BID) | RESPIRATORY_TRACT | Status: DC

## 2015-06-15 ENCOUNTER — Other Ambulatory Visit: Payer: Self-pay

## 2015-06-15 ENCOUNTER — Telehealth: Payer: Self-pay

## 2015-06-15 MED ORDER — FLUTICASONE PROPIONATE HFA 220 MCG/ACT IN AERO: 2.0000 | INHALATION_SPRAY | Freq: Two times a day (BID) | RESPIRATORY_TRACT | Status: AC

## 2015-06-15 MED ORDER — FLUTICASONE PROPIONATE 50 MCG/ACT NA SUSP: NASAL | Status: AC

## 2015-06-15 NOTE — Telephone Encounter (Signed)
Called pt, VM not set up

## 2015-06-15 NOTE — Telephone Encounter (Signed)
Pt is needing to talk with someone [facility]   Best number [phone]

## 2015-07-16 DIAGNOSIS — N4340 Spermatocele of epididymis, unspecified: Secondary | ICD-10-CM | POA: Diagnosis not present

## 2015-07-16 DIAGNOSIS — N401 Enlarged prostate with lower urinary tract symptoms: Secondary | ICD-10-CM | POA: Diagnosis not present

## 2015-07-16 DIAGNOSIS — R972 Elevated prostate specific antigen [PSA]: Secondary | ICD-10-CM | POA: Diagnosis not present

## 2015-07-16 DIAGNOSIS — R351 Nocturia: Secondary | ICD-10-CM | POA: Diagnosis not present

## 2015-11-19 ENCOUNTER — Telehealth: Payer: Self-pay

## 2015-11-19 NOTE — Telephone Encounter (Signed)
PATIENT WOULD LIKE TO KNOW IF HE HAS EVER HAD THE PREVNAR-13 PNEUMONIA SHOT? BEST PHONE [phone] (CELL)  [place]

## 2015-11-21 NOTE — Telephone Encounter (Signed)
L/m . Advised he had   prevnar 13    01/29/15  Pneumonia 23 05/03/09

## 2015-11-28 ENCOUNTER — Encounter (HOSPITAL_COMMUNITY): Payer: Self-pay

## 2015-11-28 DIAGNOSIS — Z7982 Long term (current) use of aspirin: Secondary | ICD-10-CM | POA: Insufficient documentation

## 2015-11-28 DIAGNOSIS — Z5321 Procedure and treatment not carried out due to patient leaving prior to being seen by health care provider: Secondary | ICD-10-CM | POA: Diagnosis not present

## 2015-11-28 DIAGNOSIS — R4182 Altered mental status, unspecified: Secondary | ICD-10-CM | POA: Insufficient documentation

## 2015-11-28 DIAGNOSIS — I10 Essential (primary) hypertension: Secondary | ICD-10-CM | POA: Insufficient documentation

## 2015-11-28 DIAGNOSIS — J45909 Unspecified asthma, uncomplicated: Secondary | ICD-10-CM | POA: Diagnosis not present

## 2015-11-28 DIAGNOSIS — Z96611 Presence of right artificial shoulder joint: Secondary | ICD-10-CM | POA: Diagnosis not present

## 2015-11-28 NOTE — ED Notes (Signed)
Pt asking to go home.  Pt's wife states "he seems better".  This RN encouraged patient to wait on lab results to begin to result, patient and wife state they do not want to wait very long.  Will update shortly.

## 2015-11-28 NOTE — ED Triage Notes (Signed)
Per EMS pt has hx of dementia; pt is normally able to function independently; per EMS pt had 3 glasses of wine at with dinner and went to bed around 1930. Pt was found at 2100 in wife's closet with all her clothes on the floor with an unknown liquid substance on the floor; Pt does not remember going to bed or going into the closet; Pt a&ox 3 on arrival; Pt is hard of hearing; spouse with patient on arrival. Pt denies pain

## 2015-11-29 ENCOUNTER — Emergency Department (HOSPITAL_COMMUNITY)
Admission: EM | Admit: 2015-11-29 | Discharge: 2015-11-29 | Disposition: A | Discharge status: Home / Self Care | Payer: Medicare Other | Attending: Emergency Medicine | Admitting: Emergency Medicine

## 2015-11-29 NOTE — ED Notes (Signed)
Pt and wife ambulated to RN, asked for results.  This RN explained that results had not come back.  Pt and Wife decided to leave, will follow up with PCP in the morning.

## 2016-03-24 ENCOUNTER — Telehealth: Payer: Self-pay

## 2016-03-24 ENCOUNTER — Encounter: Payer: Self-pay | Admitting: Physician Assistant

## 2016-03-24 ENCOUNTER — Ambulatory Visit (INDEPENDENT_AMBULATORY_CARE_PROVIDER_SITE_OTHER): Payer: Medicare Other | Admitting: Physician Assistant

## 2016-03-24 VITALS — BP 154/88 | HR 70 | Temp 98.0°F | Resp 18 | Ht 70.5 in | Wt 240.0 lb

## 2016-03-24 DIAGNOSIS — H9193 Unspecified hearing loss, bilateral: Secondary | ICD-10-CM

## 2016-03-24 DIAGNOSIS — Z8619 Personal history of other infectious and parasitic diseases: Secondary | ICD-10-CM

## 2016-03-24 DIAGNOSIS — H5500 Unspecified nystagmus: Secondary | ICD-10-CM | POA: Diagnosis not present

## 2016-03-24 DIAGNOSIS — J069 Acute upper respiratory infection, unspecified: Secondary | ICD-10-CM | POA: Diagnosis not present

## 2016-03-24 DIAGNOSIS — H66003 Acute suppurative otitis media without spontaneous rupture of ear drum, bilateral: Secondary | ICD-10-CM | POA: Diagnosis not present

## 2016-03-24 DIAGNOSIS — H906 Mixed conductive and sensorineural hearing loss, bilateral: Secondary | ICD-10-CM | POA: Diagnosis not present

## 2016-03-24 NOTE — Progress Notes (Signed)
[name] [name]  MRN: [id number] DOB: [birth year]  PCP: No primary care provider on file.  Subjective:  Pt is a pleasant 76 year old male PMH HTN, arterial dissection, CHF, ventricular ectopy, obstructive sleep apnea, who presents to clinic for sudden onset hearing loss. He is here today with his wife. Hearing loss started about one week ago. R>L. States this is in both ears, however he has h/o chronic hearing loss, wears b/l hearing aids. +new mediation. Called former PCP, Dr. Elder Cyphers, three evenings ago concerning current symptoms, prescribed Prednisone 20mg  taper for 12 days. Started this 3 nights ago. He is taking this as prescribed. Also taking Klonopin for h/o steroid induced psychosis.  He feels "a little bit better" since starting Prednisone 20mg  3 days ago, however his wife says he is still watching television with the volume louder than normal.   +tinnitis, not new. Present for several years. Happens intermittently. No increase in the past week. +presbycusis. Wears b/l hearing aids x several years. Serviced at the [employer] regularly, Cleanses daily.  + recent viral illness. Feeling better.  + Unsteady gait/stumbling x several years. Worsened over the past few months. No increase worsening over the past wast week +pt demonstrates nystagmus during interview. Wife has not noticed this before. Last night noticed his eyes "looked dazed".  PCP is the [place].  Denies ear fullness, ear drainage, ear pain, dizziness, vomiting, nausea, fever, chills, visual disturbances, facial drooping, one-sided weakness.   Review of Systems  Constitutional: Negative for diaphoresis.  HENT: Positive for hearing loss and tinnitus. Negative for congestion, dental problem, ear discharge, ear pain, sinus pain, sinus pressure and trouble swallowing.   Eyes: Negative for photophobia and visual disturbance.  Respiratory: Negative for cough, chest tightness, shortness of breath and wheezing.     Cardiovascular: Negative for chest pain and palpitations.  Gastrointestinal: Negative for diarrhea, nausea and vomiting.  Musculoskeletal: Positive for gait problem. Negative for neck pain and neck stiffness.  Neurological: Negative for dizziness, syncope, light-headedness and headaches.  Psychiatric/Behavioral: Negative for agitation, confusion, decreased concentration and sleep disturbance. The patient is not nervous/anxious.     Patient Active Problem List   Diagnosis Date Noted  . Bigeminy 10/03/2014  . Chronic systolic CHF (congestive heart failure) ([place]) 10/03/2014  . Obesity 09/25/2014  . Post traumatic stress disorder 06/22/2013  . Unspecified sinusitis (chronic) 06/22/2013  . Asthma exacerbation 06/22/2013  . Upper airway cough syndrome 06/22/2013  . GERD (gastroesophageal reflux disease) 06/22/2013  . Seasonal allergies 06/22/2013  . Chronic sinusitis 06/22/2013  . Acute asthma exacerbation 06/21/2013  . Intrinsic asthma 06/21/2013  . Asthma attack 06/21/2013  . Pulmonary nodule 02/12/2012  . Syncope 06/14/2011  . Facial laceration 06/14/2011  . Anxiety disorder 06/14/2011  . Allergic rhinitis 06/14/2011  . Chronic musculoskeletal pain 06/14/2011  . Left ventricular systolic dysfunction 99991111  . DYSPHAGIA UNSPECIFIED 06/13/2009  . PERSONAL HISTORY OF COLONIC POLYPS 06/13/2009  . VENTRICULAR ECTOPY 08/09/2008  . ARTERIAL DISSECTION 08/09/2008  . CHEST DISCOMFORT 08/09/2008  . OBSTRUCTIVE SLEEP APNEA 11/03/2007  . ALLERGIC RHINITIS 10/26/2007  . Essential hypertension 10/25/2007    Current Outpatient Prescriptions on File Prior to Visit  Medication Sig Dispense Refill  . albuterol (PROVENTIL) (2.5 MG/3ML) 0.083% nebulizer solution Take 3 mLs (2.5 mg total) by nebulization every 6 (six) hours as needed for wheezing or shortness of breath. 75 mL 11  . Ascorbic Acid (VITAMIN C) 1000 MG tablet Take 1,000 mg by mouth daily.      Marland Kitchen  aspirin 325 MG EC tablet Take  162.5 mg by mouth daily.     . B Complex Vitamins (VITAMIN B COMPLEX) TABS Take 1 tablet by mouth daily. Reported on 01/29/2015    . cholecalciferol (VITAMIN D) 1000 UNITS tablet Take 2,000 Units by mouth daily.     . DULoxetine (CYMBALTA) 30 MG capsule Take 30 mg by mouth every other day. Reported on 05/22/2015    . fluticasone (FLONASE) 50 MCG/ACT nasal spray USE 2 SPRAYS IN EACH NOSTRIL DAILY 48 g 3  . fluticasone (FLOVENT HFA) 220 MCG/ACT inhaler Inhale 2 puffs into the lungs 2 (two) times daily. 3 Inhaler 3  . lidocaine (LIDODERM) 5 % APPLY ONE-HALF (1/2) PATCH TO EACH FOOT AND ONE-HALF (1/2) PATCH TO 1 SHOULDER IN THE EVENING. 135 patch 3  . losartan (COZAAR) 25 MG tablet Take 12.5 mg by mouth daily.    . Multiple Vitamins-Minerals (ONE-A-DAY 50 PLUS PO) Take 1 tablet by mouth daily.    . Multiple Vitamins-Minerals (PRESERVISION AREDS 2) CAPS Take 1 capsule by mouth 2 (two) times daily.    Marland Kitchen nystatin-triamcinolone (MYCOLOG II) cream Apply 1 application topically 2 (two) times daily. Reported on 05/22/2015    . PROAIR HFA 108 (90 BASE) MCG/ACT inhaler INHALE 2 PUFFS INTO THE LUNGS EVERY 4 HOURS AS NEEDED FOR WHEEZING 3 each 1  . Turmeric 450 MG CAPS Take 1 capsule by mouth 2 (two) times daily. Reported on 05/22/2015    . verapamil (CALAN-SR) 120 MG CR tablet Take 120 mg by mouth daily.     Marland Kitchen losartan (COZAAR) 25 MG tablet TAKE 1 TABLET DAILY (Patient not taking: Reported on 03/24/2016) 180 tablet 2  . verapamil (CALAN-SR) 120 MG CR tablet TAKE 1 TABLET DAILY (Patient not taking: Reported on 03/24/2016) 90 tablet 2   No current facility-administered medications on file prior to visit.     Allergies  Allergen Reactions  . Shrimp [Shellfish Allergy] Anaphylaxis  . Contrast Media [Iodinated Diagnostic Agents] Other (See Comments)    Skin reaction-not sure exactly-per MD  . Metoprolol Other (See Comments)    Causes BP issues, heart palpatiions  . Morphine Hives  . Oxycodone-Acetaminophen  Nausea And Vomiting  . Penicillins Swelling and Rash     Objective:  BP (!) 154/88 (BP Location: Right Arm, Patient Position: Sitting, Cuff Size: Large)   Pulse 70   Temp 98 F (36.7 C) (Oral)   Resp 18   Ht 5' 10.5" (1.791 m)   Wt 240 lb (108.9 kg)   SpO2 96%   BMI 33.95 kg/m   Physical Exam  Constitutional: He is oriented to person, place, and time and well-developed, well-nourished, and in no distress. No distress.  HENT:  Right Ear: Tympanic membrane and ear canal normal. Decreased hearing (Failed hearing screen all levles. ) is noted.  Left Ear: Tympanic membrane and ear canal normal. Decreased hearing (Passed hearing screen 40 at 500Hz  only. ) is noted.  Eyes: Conjunctivae are normal. Pupils are equal, round, and reactive to light. Right eye exhibits nystagmus. Left eye exhibits nystagmus.  Neck: Normal range of motion. No neck rigidity. Normal range of motion present.  Cardiovascular: Normal rate, regular rhythm and normal heart sounds.   Musculoskeletal:       Right shoulder: He exhibits decreased range of motion (Limited ROM with abduction. Not a new fnding. H/o shoulder surgery. ).  Neurological: He is alert and oriented to person, place, and time. He has normal sensation and intact cranial nerves.  He displays facial symmetry. He has a normal Cerebellar Exam and a normal Romberg Test. He shows no pronator drift. Gait normal. GCS score is 15.  Skin: Skin is warm and dry.  Psychiatric: Mood, memory, affect and judgment normal.  Vitals reviewed.   Assessment and Plan :  This case was discussed with his former PCP, Dr. Elder Cyphers. 1. Bilateral hearing loss, unspecified hearing loss type 2. Nystagmus 3. History of viral illness - Ambulatory referral to ENT - MR Brain W Wo Contrast; Future - Ambulatory referral to Neurology - Basic metabolic panel; Future - 76 year old pt c new-onset hearing loss. Concern for cerebral arterial occlusion vs. Vasculitis vs. vestibular neuritis.  Imaging warranted, considering PMH arterial dissection, HTN. Nystagmus is possibly medication-induced as pt recently started on Klonopin and Prednisone, h/o steroid-induced psychosis. ENT and neuro for further work-up. Will contact pt with imaging results.   Mercer Pod, PA-C  [facility] [place] 03/24/2016 8:41 AM

## 2016-03-24 NOTE — Telephone Encounter (Signed)
Would like a call back from [facility] -pt states that she would give them a call today   Best number [phone]

## 2016-03-24 NOTE — Patient Instructions (Signed)
     IF you received an x-ray today, you will receive an invoice from [facility]. Please contact [facility] at [phone] with questions or concerns regarding your invoice.   IF you received labwork today, you will receive an invoice from [facility]. Please contact [facility] at [phone] with questions or concerns regarding your invoice.   Our billing staff will not be able to assist you with questions regarding bills from these companies.  You will be contacted with the lab results as soon as they are available. The fastest way to get your results is to activate your My Chart account. Instructions are located on the last page of this paperwork. If you have not heard from us regarding the results in 2 weeks, please contact this office.     

## 2016-03-25 ENCOUNTER — Ambulatory Visit (HOSPITAL_COMMUNITY): Admission: RE | Admit: 2016-03-25 | Payer: Medicare Other | Source: Ambulatory Visit

## 2016-04-16 ENCOUNTER — Institutional Professional Consult (permissible substitution): Payer: Medicare Other | Admitting: Diagnostic Neuroimaging

## 2016-05-02 DIAGNOSIS — R399 Unspecified symptoms and signs involving the genitourinary system: Secondary | ICD-10-CM | POA: Diagnosis not present

## 2016-05-21 DIAGNOSIS — L821 Other seborrheic keratosis: Secondary | ICD-10-CM | POA: Diagnosis not present

## 2016-06-23 DIAGNOSIS — N432 Other hydrocele: Secondary | ICD-10-CM | POA: Diagnosis not present

## 2016-06-23 DIAGNOSIS — N200 Calculus of kidney: Secondary | ICD-10-CM | POA: Diagnosis not present

## 2016-06-23 DIAGNOSIS — R1030 Lower abdominal pain, unspecified: Secondary | ICD-10-CM | POA: Diagnosis not present

## 2016-10-15 DIAGNOSIS — N289 Disorder of kidney and ureter, unspecified: Secondary | ICD-10-CM | POA: Diagnosis not present

## 2016-10-15 DIAGNOSIS — N281 Cyst of kidney, acquired: Secondary | ICD-10-CM | POA: Diagnosis not present

## 2016-10-16 ENCOUNTER — Other Ambulatory Visit: Payer: Self-pay | Admitting: Emergency Medicine

## 2016-10-17 DIAGNOSIS — N289 Disorder of kidney and ureter, unspecified: Secondary | ICD-10-CM | POA: Diagnosis not present

## 2016-10-28 DIAGNOSIS — N2889 Other specified disorders of kidney and ureter: Secondary | ICD-10-CM | POA: Diagnosis not present

## 2016-10-29 ENCOUNTER — Ambulatory Visit: Payer: Medicare Other | Admitting: Diagnostic Neuroimaging

## 2016-12-29 DIAGNOSIS — N401 Enlarged prostate with lower urinary tract symptoms: Secondary | ICD-10-CM | POA: Diagnosis not present

## 2016-12-29 DIAGNOSIS — N200 Calculus of kidney: Secondary | ICD-10-CM | POA: Diagnosis not present

## 2016-12-29 DIAGNOSIS — R972 Elevated prostate specific antigen [PSA]: Secondary | ICD-10-CM | POA: Diagnosis not present

## 2016-12-29 DIAGNOSIS — N138 Other obstructive and reflux uropathy: Secondary | ICD-10-CM | POA: Diagnosis not present

## 2016-12-29 DIAGNOSIS — N4340 Spermatocele of epididymis, unspecified: Secondary | ICD-10-CM | POA: Diagnosis not present

## 2018-02-16 ENCOUNTER — Institutional Professional Consult (permissible substitution): Payer: Medicare Other | Admitting: Diagnostic Neuroimaging

## 2018-02-25 ENCOUNTER — Telehealth: Payer: Self-pay | Admitting: Diagnostic Neuroimaging

## 2018-03-02 NOTE — Telephone Encounter (Signed)
error 

## 2018-03-16 ENCOUNTER — Telehealth: Payer: Self-pay | Admitting: *Deleted

## 2018-03-16 NOTE — Telephone Encounter (Signed)
Called wife, Santiago Glad, on DPR to discuss referral for her husband. She stated she would have to call back.

## 2018-03-16 NOTE — Telephone Encounter (Addendum)
Pt's wife called back. States she cannot get involved with his appt tomorrow. But she was calling back to state pt does not have PCP in [place] anymore. States he is "coming in tomorrow with PCP of Dr Lou Miner", he does have PCP at [facility]. States she is going to have to hang up, she cannot take any phone calls relating to this appt. The phone call was disconnected.

## 2018-03-16 NOTE — Telephone Encounter (Addendum)
Dr Elder Cyphers called earlier and requested to be called back.  He is brother-in-law, former PCP for patient and on DPR. I spoke with him, and he stated the patient is very paranoid, will not leave his home, so he is cancelling the appointment on Wed.  He asked what can be done to help the patient; I asked if he is harming himself or others because 911 could be called. Dr Elder Cyphers stated he is not. I will mail a dementia packet to wife at Dr Luiz Ochoa request. I advised that maybe DSS could offer advice. Dr Elder Cyphers verbalized appreciation for information. Dr Leta Baptist aware. If Dr Elder Cyphers calls at a later time for appointment for this patient, Dr Tish Frederickson will see him.

## 2018-03-17 ENCOUNTER — Ambulatory Visit: Payer: Medicare Other | Admitting: Diagnostic Neuroimaging

## 2018-03-17 ENCOUNTER — Encounter

## 2018-06-22 ENCOUNTER — Encounter: Payer: Self-pay | Admitting: Diagnostic Neuroimaging

## 2018-06-22 ENCOUNTER — Telehealth: Payer: Self-pay | Admitting: Diagnostic Neuroimaging

## 2018-06-22 NOTE — Telephone Encounter (Signed)
Dr. Elder Cyphers is calling to speak to Dr. Leta Baptist or his nurse concerning trying to refer his brother inlaw back to our office.you can reach him at [phone]

## 2018-06-22 NOTE — Telephone Encounter (Signed)
Pt wife has called asking that a RN calls her back with tips on how to encourage pt to keep his upcoming appointment.  Wife has stated as she did earlier today that there is an increase in memory issues re:pt.  Wife stated pt has moments that he does remember who she is .  Wife is asking that RN 1st calls her mobile # [phone]. Wife said if she doesn't pick up its because she is with pt.  Wife is asking her daughter Edmonia Lynch) be called as an alternate if she is not available. Edmonia Lynch [phone]

## 2018-06-22 NOTE — Addendum Note (Signed)
Addended by: Minna Antis on: 06/22/2018 04:06 PM   Modules accepted: Orders

## 2018-06-22 NOTE — Telephone Encounter (Signed)
We can offer video visit. -VRP

## 2018-06-22 NOTE — Telephone Encounter (Signed)
At 8:12 this morning pt's wife called expressing her concern for an increase in memory issues re:pt.  Wife stated pt has moments that he does remember who she is or how long they have been married.  Wife stated there has been an increase in paranoia as well.  Wife stated she made pt something to eat and he threw it away.  When wife was  Told that pt needs to have a virtual visit she said she would have to speak with pt's daughter and see if maybe she could get pt to agree to do a VV.  Wife stated she would call back.

## 2018-06-22 NOTE — Telephone Encounter (Addendum)
Called wife who stated her daughter, Edmonia Lynch will assist with video visit Thurs. She stated they have such difficulty getting him to agree to dr visits. He has a consult with [facility] on May 29. But she doesn't want to cancel appt here. She asked how to insure he'll cooperate with video visit. I advised her the family knows him, his character, how best to communicate with him. I advised her of office policy re: no shows/late cancellations. She stated she will discuss with her daughter and will call if patient refuses to do video visit. She will call > 24 hours. She  verbalized understanding, appreciation.

## 2018-06-23 NOTE — Telephone Encounter (Signed)
Pt daughter Jeannie(which is not on the [facility]) has called stating that she wanted to cancel the appointment for tomorrow.  She stated she is not able to get pt to agree to keep the appointment,  Daughter stated she tried everything to convince pt to keep the vv since he successfully completed one with another dr today.  Daughter stated the pt told her maybe within a week or two he would reconsider.  The wife is only on the [facility].  The appointment was not cancelled based on the daughter not being on the DPR just the wife

## 2018-06-23 NOTE — Telephone Encounter (Signed)
Per my conversation with wife yesterday appointment is canceled.

## 2018-06-24 ENCOUNTER — Ambulatory Visit: Payer: Medicare Other | Admitting: Diagnostic Neuroimaging

## 2018-08-04 ENCOUNTER — Encounter (HOSPITAL_COMMUNITY): Payer: Self-pay | Admitting: Emergency Medicine

## 2018-08-04 ENCOUNTER — Other Ambulatory Visit: Payer: Self-pay

## 2018-08-04 ENCOUNTER — Emergency Department (HOSPITAL_COMMUNITY)
Admission: EM | Admit: 2018-08-04 | Discharge: 2018-08-21 | Disposition: A | Discharge status: Home / Self Care | Payer: No Typology Code available for payment source | Attending: Emergency Medicine | Admitting: Emergency Medicine

## 2018-08-04 DIAGNOSIS — Z87891 Personal history of nicotine dependence: Secondary | ICD-10-CM | POA: Diagnosis not present

## 2018-08-04 DIAGNOSIS — J45909 Unspecified asthma, uncomplicated: Secondary | ICD-10-CM | POA: Insufficient documentation

## 2018-08-04 DIAGNOSIS — K5730 Diverticulosis of large intestine without perforation or abscess without bleeding: Secondary | ICD-10-CM | POA: Insufficient documentation

## 2018-08-04 DIAGNOSIS — Z7951 Long term (current) use of inhaled steroids: Secondary | ICD-10-CM | POA: Diagnosis not present

## 2018-08-04 DIAGNOSIS — F0391 Unspecified dementia with behavioral disturbance: Secondary | ICD-10-CM | POA: Insufficient documentation

## 2018-08-04 DIAGNOSIS — Z79899 Other long term (current) drug therapy: Secondary | ICD-10-CM | POA: Insufficient documentation

## 2018-08-04 DIAGNOSIS — Z87820 Personal history of traumatic brain injury: Secondary | ICD-10-CM | POA: Diagnosis not present

## 2018-08-04 DIAGNOSIS — I110 Hypertensive heart disease with heart failure: Secondary | ICD-10-CM | POA: Insufficient documentation

## 2018-08-04 DIAGNOSIS — I429 Cardiomyopathy, unspecified: Secondary | ICD-10-CM | POA: Insufficient documentation

## 2018-08-04 DIAGNOSIS — Z7982 Long term (current) use of aspirin: Secondary | ICD-10-CM | POA: Insufficient documentation

## 2018-08-04 DIAGNOSIS — I5022 Chronic systolic (congestive) heart failure: Secondary | ICD-10-CM | POA: Diagnosis not present

## 2018-08-04 DIAGNOSIS — Z03818 Encounter for observation for suspected exposure to other biological agents ruled out: Secondary | ICD-10-CM | POA: Insufficient documentation

## 2018-08-04 DIAGNOSIS — I10 Essential (primary) hypertension: Secondary | ICD-10-CM | POA: Diagnosis not present

## 2018-08-04 DIAGNOSIS — R9431 Abnormal electrocardiogram [ECG] [EKG]: Secondary | ICD-10-CM | POA: Diagnosis not present

## 2018-08-04 DIAGNOSIS — F419 Anxiety disorder, unspecified: Secondary | ICD-10-CM | POA: Insufficient documentation

## 2018-08-04 DIAGNOSIS — Z8249 Family history of ischemic heart disease and other diseases of the circulatory system: Secondary | ICD-10-CM | POA: Diagnosis not present

## 2018-08-04 DIAGNOSIS — Z86010 Personal history of colonic polyps: Secondary | ICD-10-CM | POA: Insufficient documentation

## 2018-08-04 DIAGNOSIS — F0390 Unspecified dementia without behavioral disturbance: Secondary | ICD-10-CM

## 2018-08-04 DIAGNOSIS — R4182 Altered mental status, unspecified: Secondary | ICD-10-CM | POA: Diagnosis present

## 2018-08-04 HISTORY — DX: Unspecified dementia, unspecified severity, without behavioral disturbance, psychotic disturbance, mood disturbance, and anxiety: F03.90

## 2018-08-04 LAB — COMPREHENSIVE METABOLIC PANEL
ALT: 16 U/L (ref 0–44)
AST: 23 U/L (ref 15–41)
Albumin: 4.4 g/dL (ref 3.5–5.0)
Alkaline Phosphatase: 58 U/L (ref 38–126)
Anion gap: 11 (ref 5–15)
BUN: 15 mg/dL (ref 8–23)
CO2: 24 mmol/L (ref 22–32)
Calcium: 9.3 mg/dL (ref 8.9–10.3)
Chloride: 107 mmol/L (ref 98–111)
Creatinine, Ser: 0.91 mg/dL (ref 0.61–1.24)
GFR calc Af Amer: >60 mL/min (ref >60)
GFR calc non Af Amer: >60 mL/min (ref >60)
Glucose, Bld: 110 mg/dL — ABNORMAL HIGH (ref 70–99)
Potassium: 3.6 mmol/L (ref 3.5–5.1)
Sodium: 142 mmol/L (ref 135–145)
Total Bilirubin: 1.8 mg/dL — ABNORMAL HIGH (ref 0.3–1.2)
Total Protein: 6.8 g/dL (ref 6.5–8.1)

## 2018-08-04 LAB — PROTIME-INR
INR: 1 (ref 0.8–1.2)
Prothrombin Time: 13.6 seconds (ref 11.4–15.2)

## 2018-08-04 LAB — SARS CORONAVIRUS 2 BY RT PCR (HOSPITAL ORDER, PERFORMED IN ~~LOC~~ HOSPITAL LAB): SARS Coronavirus 2: NEGATIVE

## 2018-08-04 LAB — CBC
HCT: 46.2 % (ref 39.0–52.0)
Hemoglobin: 15.3 g/dL (ref 13.0–17.0)
MCH: 30.7 pg (ref 26.0–34.0)
MCHC: 33.1 g/dL (ref 30.0–36.0)
MCV: 92.8 fL (ref 80.0–100.0)
Platelets: 243 10*3/uL (ref 150–400)
RBC: 4.98 MIL/uL (ref 4.22–5.81)
RDW: 12.5 % (ref 11.5–15.5)
WBC: 7.8 10*3/uL (ref 4.0–10.5)
nRBC: 0 % (ref 0.0–0.2)

## 2018-08-04 LAB — RAPID URINE DRUG SCREEN, HOSP PERFORMED
Amphetamines: NOT DETECTED
Barbiturates: NOT DETECTED
Benzodiazepines: NOT DETECTED
Cocaine: NOT DETECTED
Opiates: NOT DETECTED
Tetrahydrocannabinol: NOT DETECTED

## 2018-08-04 LAB — URINALYSIS, ROUTINE W REFLEX MICROSCOPIC
Bacteria, UA: NONE SEEN
Bilirubin Urine: NEGATIVE
Glucose, UA: NEGATIVE mg/dL
Ketones, ur: 20 mg/dL — AB
Leukocytes,Ua: NEGATIVE
Nitrite: NEGATIVE
Protein, ur: NEGATIVE mg/dL
Specific Gravity, Urine: 1.023 (ref 1.005–1.030)
pH: 6 (ref 5.0–8.0)

## 2018-08-04 LAB — DIFFERENTIAL
Abs Immature Granulocytes: 0.02 10*3/uL (ref 0.00–0.07)
Basophils Absolute: 0 10*3/uL (ref 0.0–0.1)
Basophils Relative: 0 %
Eosinophils Absolute: 0 10*3/uL (ref 0.0–0.5)
Eosinophils Relative: 0 %
Immature Granulocytes: 0 %
Lymphocytes Relative: 10 %
Lymphs Abs: 0.8 10*3/uL (ref 0.7–4.0)
Monocytes Absolute: 0.6 10*3/uL (ref 0.1–1.0)
Monocytes Relative: 7 %
Neutro Abs: 6.4 10*3/uL (ref 1.7–7.7)
Neutrophils Relative %: 83 %

## 2018-08-04 LAB — I-STAT CHEM 8, ED
BUN: 17 mg/dL (ref 8–23)
Calcium, Ion: 1.15 mmol/L (ref 1.15–1.40)
Chloride: 106 mmol/L (ref 98–111)
Creatinine, Ser: 0.8 mg/dL (ref 0.61–1.24)
Glucose, Bld: 108 mg/dL — ABNORMAL HIGH (ref 70–99)
HCT: 44 % (ref 39.0–52.0)
Hemoglobin: 15 g/dL (ref 13.0–17.0)
Potassium: 3.5 mmol/L (ref 3.5–5.1)
Sodium: 141 mmol/L (ref 135–145)
TCO2: 27 mmol/L (ref 22–32)

## 2018-08-04 LAB — ETHANOL: Alcohol, Ethyl (B): <10 mg/dL (ref <10)

## 2018-08-04 LAB — APTT: aPTT: 27 seconds (ref 24–36)

## 2018-08-04 MED ORDER — DULOXETINE HCL 30 MG PO CPEP
30.0000 mg | ORAL_CAPSULE | ORAL | Status: DC
  Administered 2018-08-04 – 2018-08-20 (×9): 30 mg via ORAL
  Filled 2018-08-04 (×14): qty 1

## 2018-08-04 MED ORDER — ASPIRIN 81 MG PO CHEW
162.0000 mg | CHEWABLE_TABLET | Freq: Every day | ORAL | Status: DC
  Administered 2018-08-04 – 2018-08-21 (×18): 162 mg via ORAL
  Filled 2018-08-04 (×19): qty 2

## 2018-08-04 MED ORDER — VERAPAMIL HCL ER 120 MG PO TBCR
120.0000 mg | EXTENDED_RELEASE_TABLET | Freq: Every day | ORAL | Status: DC
  Administered 2018-08-04 – 2018-08-21 (×18): 120 mg via ORAL
  Filled 2018-08-04 (×18): qty 1

## 2018-08-04 MED ORDER — ASPIRIN EC 81 MG PO TBEC
162.5000 mg | DELAYED_RELEASE_TABLET | Freq: Every day | ORAL | Status: DC
  Administered 2018-08-09: 162.5 mg via ORAL
  Filled 2018-08-04: qty 2

## 2018-08-04 MED ORDER — LORAZEPAM 2 MG/ML IJ SOLN
1.0000 mg | Freq: Once | INTRAMUSCULAR | Status: AC
  Administered 2018-08-13: 12:00:00 1 mg via INTRAMUSCULAR
  Filled 2018-08-04: qty 1

## 2018-08-04 MED ORDER — ZIPRASIDONE MESYLATE 20 MG IM SOLR
10.0000 mg | INTRAMUSCULAR | Status: AC | PRN
  Administered 2018-08-04 – 2018-08-05 (×2): 10 mg via INTRAMUSCULAR
  Filled 2018-08-04 (×2): qty 20

## 2018-08-04 MED ORDER — SODIUM CHLORIDE 0.9% FLUSH
3.0000 mL | Freq: Once | INTRAVENOUS | Status: DC
Start: 2018-08-04 — End: 2018-08-21

## 2018-08-04 MED ORDER — STERILE WATER FOR INJECTION IJ SOLN
INTRAMUSCULAR | Status: AC
  Administered 2018-08-04: 1.2 mL
  Filled 2018-08-04: qty 10

## 2018-08-04 MED ORDER — ZIPRASIDONE MESYLATE 20 MG IM SOLR: 10.0000 mg | Freq: Once | INTRAMUSCULAR | Status: DC

## 2018-08-04 NOTE — ED Notes (Signed)
Belongings in locker # 6.  Pts daughter took pts phone and wallet.

## 2018-08-04 NOTE — ED Triage Notes (Signed)
Patient was brought to the ED by neighbor who is a Agricultural consultant however left without talking to staff. Patient altered mental status unknown time. Patient knows name, birthday and he is at [place] however confused of year. Alert following commands. Van negative.

## 2018-08-04 NOTE — ED Notes (Signed)
Spoke with ED doctor with patient symptoms and ordered stroke order set without calling a code stroke.
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
## 2018-08-04 NOTE — ED Notes (Signed)
Upon entering patients room, pt was unhooked from monitor and getting dressed.  Stated he had to leave.  "My daughter is getting married in two hours".  Dr. Sedonia Small in speaking with patient.  Dr. Sedonia Small calling person that dropped pt off at ED and wife.

## 2018-08-04 NOTE — ED Notes (Signed)
[place] [address]
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
## 2018-08-04 NOTE — ED Notes (Signed)
Patient called family member "Santiago Glad" to help calm patient; pt is wondering and attempting to leave and believes his wife is in another room; Sitter able to talk to patient and redirect for now-Monique,RN

## 2018-08-04 NOTE — ED Notes (Signed)
Pt moved into rm 52- he is unsure why he is here. States he missed all three of his daughter's weddings today.

## 2018-08-04 NOTE — ED Notes (Signed)
Nephew Jason sitting at bedside with patient.  Explained process (labs, urine, changing into scrubs) to patient, pt calm and verbalized understanding.

## 2018-08-04 NOTE — ED Notes (Signed)
Sharyn Lull -- daughter -- [phone]  PLEASE CALL FIRST Ronalee Belts -- son --- [phone] Santiago Glad -- wife -- [phone]

## 2018-08-04 NOTE — ED Notes (Signed)
Pt came out to hall requesting someone see him.  This nurse was in another room and I advised pt that I would be in to see patient as soon as I finished with the patient I was currently working with.

## 2018-08-04 NOTE — ED Notes (Signed)
Contact info Nephew: Lubertha Sayres Phone number: [phone]

## 2018-08-05 ENCOUNTER — Emergency Department (HOSPITAL_COMMUNITY): Payer: No Typology Code available for payment source

## 2018-08-05 MED ORDER — MEMANTINE HCL 10 MG PO TABS
10.0000 mg | ORAL_TABLET | Freq: Two times a day (BID) | ORAL | Status: DC
  Administered 2018-08-05 – 2018-08-21 (×29): 10 mg via ORAL
  Filled 2018-08-05 (×35): qty 1

## 2018-08-05 MED ORDER — BUDESONIDE 0.5 MG/2ML IN SUSP
0.5000 mg | Freq: Two times a day (BID) | RESPIRATORY_TRACT | Status: DC
  Administered 2018-08-06 – 2018-08-11 (×4): 0.5 mg via RESPIRATORY_TRACT
  Filled 2018-08-05 (×7): qty 2

## 2018-08-05 MED ORDER — ALBUTEROL SULFATE HFA 108 (90 BASE) MCG/ACT IN AERS: 2.0000 | INHALATION_SPRAY | RESPIRATORY_TRACT | Status: DC | PRN

## 2018-08-05 MED ORDER — DORZOLAMIDE HCL 2 % OP SOLN
1.0000 [drp] | Freq: Two times a day (BID) | OPHTHALMIC | Status: DC
  Administered 2018-08-05 – 2018-08-21 (×20): 1 [drp] via OPHTHALMIC
  Filled 2018-08-05 (×3): qty 10

## 2018-08-05 MED ORDER — LATANOPROST 0.005 % OP SOLN
1.0000 [drp] | Freq: Every day | OPHTHALMIC | Status: DC
  Administered 2018-08-05 – 2018-08-19 (×8): 1 [drp] via OPHTHALMIC
  Filled 2018-08-05: qty 2.5

## 2018-08-05 MED ORDER — QUETIAPINE FUMARATE 25 MG PO TABS
25.0000 mg | ORAL_TABLET | Freq: Two times a day (BID) | ORAL | Status: DC
  Administered 2018-08-05 – 2018-08-18 (×23): 25 mg via ORAL
  Filled 2018-08-05 (×27): qty 1

## 2018-08-05 MED ORDER — FLUTICASONE PROPIONATE HFA 220 MCG/ACT IN AERO: 2.0000 | INHALATION_SPRAY | Freq: Two times a day (BID) | RESPIRATORY_TRACT | Status: DC

## 2018-08-05 MED ORDER — LOSARTAN POTASSIUM 50 MG PO TABS
25.0000 mg | ORAL_TABLET | Freq: Every day | ORAL | Status: DC
  Administered 2018-08-05 – 2018-08-21 (×17): 25 mg via ORAL
  Filled 2018-08-05 (×18): qty 1

## 2018-08-05 MED ORDER — DIVALPROEX SODIUM 125 MG PO CSDR
250.0000 mg | DELAYED_RELEASE_CAPSULE | Freq: Three times a day (TID) | ORAL | Status: DC
  Administered 2018-08-05 – 2018-08-21 (×41): 250 mg via ORAL
  Filled 2018-08-05 (×45): qty 2

## 2018-08-05 NOTE — ED Triage Notes (Signed)
Family Cloyd Stagers  In consultation room  To talk with EDP Tyrone Nine  About concerns with PT. Santiago Glad  Requested her cell Phone be returned ( Pt had [name]) cell Phone on arrival to ED 08-04-2018. This Probation officer  located a note from [employer] on Pt chart that identified the cell phone and walett was given to Sister . Santiago Glad identifed The sister was the neighbor who brought Pt to ED on 08-04-2018. Family member Demetrious Rainford  Requested the return of Pt clothing . All clothing was returned to [employer]. Cloyd Stagers signed  The Personal belongings inventory sheet and bag of clothing was given to Santiago Glad. Santiago Glad was upset because Pt did not have on underwear. Santiago Glad was in formed it was policy for Pt safety.

## 2018-08-05 NOTE — Progress Notes (Addendum)
CSW received call from [place] at [facility] regarding this patient. Maggie states that the patient has been approved for an indefinite long term stay in a locked dementia unit through the [place]. Maggie reports that this patient has PTSD and dementia. 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 Maggie reports that the social worker that will need to be contacted moving forward Florene Route at [phone]. [phone].   CSW completed patient's FL2. CSW received list of contracted [place] facilities from [employer].   CSW contacted patient's wife [name] [name] at [phone] to complete an initial assessment. CSW and [name] [name] discussed placement options for the patient and [name] preference for placement is [name] first. CSW explained to [name] [name] that the list that was provided for approved facilities only has one in [facility] in [place] and the next closest is [place]. CSW offered to email [name] [name] the list of approved facilities and [name] [name] agreeable to review list and return call to [name]. [name] [name] requested CSW reach out to patient's sister [name] [name] at [phone] to provide her with an update.  CSW attempted to reach patient's sister [name] [name] but was unsuccessful so a voicemail was left requesting a return call.  Madilyn Fireman, MSW, LCSW-A Clinical Social Worker [facility] [phone]

## 2018-08-05 NOTE — ED Triage Notes (Signed)
Dinner ordered 
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
## 2018-08-05 NOTE — NC FL2 (Signed)
[place] LEVEL OF CARE SCREENING TOOL     IDENTIFICATION  Patient Name: [name] [name] Birthdate: 05/29/1940 Sex: male Admission Date (Current Location): 08/04/2018  [facility] [place] and [place] Number:  Herbalist and Address:  [facility], [place] [address] [place], [place] [address]      Provider Number: [id number]  Attending Physician Name and Address:  Default, Provider, MD  Relative Name and Phone Number:  Jules Baty [phone]    Current Level of Care: Hospital Recommended Level of Care: Memory Care Prior Approval Number:    Date Approved/Denied: 08/05/18 PASRR Number: [id number]  Discharge Plan: Other (Comment)(Memory care)    Current Diagnoses: Patient Active Problem List   Diagnosis Date Noted  . Bigeminy 10/03/2014  . Chronic systolic CHF (congestive heart failure) ([place]) 10/03/2014  . Obesity 09/25/2014  . Post traumatic stress disorder 06/22/2013  . Unspecified sinusitis (chronic) 06/22/2013  . Asthma exacerbation 06/22/2013  . Upper airway cough syndrome 06/22/2013  . GERD (gastroesophageal reflux disease) 06/22/2013  . Seasonal allergies 06/22/2013  . Chronic sinusitis 06/22/2013  . Acute asthma exacerbation 06/21/2013  . Intrinsic asthma 06/21/2013  . Asthma attack 06/21/2013  . Pulmonary nodule 02/12/2012  . Syncope 06/14/2011  . Facial laceration 06/14/2011  . Anxiety disorder 06/14/2011  . Allergic rhinitis 06/14/2011  . Chronic musculoskeletal pain 06/14/2011  . Left ventricular systolic dysfunction 28/36/6294  . DYSPHAGIA UNSPECIFIED 06/13/2009  . PERSONAL HISTORY OF COLONIC POLYPS 06/13/2009  . VENTRICULAR ECTOPY 08/09/2008  . ARTERIAL DISSECTION 08/09/2008  . CHEST DISCOMFORT 08/09/2008  . OBSTRUCTIVE SLEEP APNEA 11/03/2007  . ALLERGIC RHINITIS 10/26/2007  . Essential hypertension 10/25/2007    Orientation RESPIRATION BLADDER Height & Weight     Self  Normal Continent Weight: 198 lb 6.6  oz (90 kg) Height:  6' (182.9 cm)  BEHAVIORAL SYMPTOMS/MOOD NEUROLOGICAL BOWEL NUTRITION STATUS    (Dementia) Continent Diet  AMBULATORY STATUS COMMUNICATION OF NEEDS Skin   Limited Assist Verbally Normal                       Personal Care Assistance Level of Assistance  Bathing, Feeding, Dressing Bathing Assistance: Limited assistance Feeding assistance: Independent Dressing Assistance: Limited assistance     Functional Limitations Info  Sight, Speech, Hearing Sight Info: Adequate Hearing Info: Adequate Speech Info: Adequate    SPECIAL CARE FACTORS FREQUENCY                       Contractures Contractures Info: Not present    Additional Factors Info   Allergies - Shrimp, Contrast Media (Iodinated Diagnostic Agents), Metoprolol, Penicillins, Morphine, Oxycodone-acetampinophen               Current Medications (08/05/2018):  This is the current hospital active medication list Current Facility-Administered Medications  Medication Dose Route Frequency Provider Last Rate Last Dose  . aspirin chewable tablet 162 mg  162 mg Oral Daily Judeth Horn, MD   162 mg at 08/05/18 0953  . aspirin EC tablet 162.5 mg  162.5 mg Oral Daily Maudie Flakes, MD      . DULoxetine (CYMBALTA) DR capsule 30 mg  30 mg Oral Salome Arnt, MD   30 mg at 08/04/18 2309  . LORazepam (ATIVAN) injection 1 mg  1 mg Intramuscular Once Julianne Rice, MD   Stopped at 08/04/18 2234  . sodium chloride flush (NS) 0.9 % injection 3 mL  3 mL Intravenous Once  Virgel Manifold, MD      . verapamil (CALAN-SR) CR tablet 120 mg  120 mg Oral Daily Maudie Flakes, MD   120 mg at 08/05/18 0953  . ziprasidone (GEODON) injection 10 mg  10 mg Intramuscular PRN Maudie Flakes, MD   10 mg at 08/04/18 2042   Current Outpatient Medications  Medication Sig Dispense Refill  . albuterol (PROVENTIL) (2.5 MG/3ML) 0.083% nebulizer solution Take 3 mLs (2.5 mg total) by nebulization every 6 (six) hours as  needed for wheezing or shortness of breath. 75 mL 11  . divalproex (DEPAKOTE SPRINKLE) 125 MG capsule Take 250 mg by mouth 3 (three) times daily.    . dorzolamide (TRUSOPT) 2 % ophthalmic solution Place 1 drop into both eyes 2 (two) times daily.    . fluticasone (FLONASE) 50 MCG/ACT nasal spray USE 2 SPRAYS IN EACH NOSTRIL DAILY (Patient taking differently: Place 2 sprays into both nostrils 2 (two) times daily as needed for allergies or rhinitis. ) 48 g 3  . fluticasone (FLOVENT HFA) 220 MCG/ACT inhaler Inhale 2 puffs into the lungs 2 (two) times daily. (Patient taking differently: Inhale 2 puffs into the lungs 2 (two) times daily as needed. ) 3 Inhaler 3  . latanoprost (XALATAN) 0.005 % ophthalmic solution Place 1 drop into the right eye at bedtime.    . Multiple Vitamins-Minerals (PRESERVISION AREDS 2) CAPS Take 1 capsule by mouth 2 (two) times daily.    Marland Kitchen PROAIR HFA 108 (90 BASE) MCG/ACT inhaler INHALE 2 PUFFS INTO THE LUNGS EVERY 4 HOURS AS NEEDED FOR WHEEZING (Patient taking differently: Inhale 2 puffs into the lungs every 4 (four) hours as needed for wheezing or shortness of breath. ) 3 each 1  . aspirin 325 MG EC tablet Take 162.5 mg by mouth daily.     . DULoxetine (CYMBALTA) 30 MG capsule Take 30 mg by mouth every other day. Reported on 05/22/2015    . lidocaine (LIDODERM) 5 % APPLY ONE-HALF (1/2) PATCH TO EACH FOOT AND ONE-HALF (1/2) PATCH TO 1 SHOULDER IN THE EVENING. (Patient not taking: Reported on 06/22/2018) 135 patch 3  . losartan (COZAAR) 25 MG tablet TAKE 1 TABLET DAILY 180 tablet 2  . memantine (NAMENDA) 10 MG tablet Take 10 mg by mouth 2 (two) times a day.    . verapamil (CALAN-SR) 120 MG CR tablet Take 120 mg by mouth daily.        Discharge Medications: Please see discharge summary for a list of discharge medications.  Relevant Imaging Results:  Relevant Lab Results:   Additional Information SSN: [id number]  Archie Endo, LCSW

## 2018-08-05 NOTE — ED Triage Notes (Signed)
TTS done 

## 2018-08-05 NOTE — ED Notes (Signed)
Lunch tray ordered 

## 2018-08-05 NOTE — BHH Counselor (Signed)
  REASSESSMENT  [facility] re-evaluated pt for safety and stability.  Pt was alert and oriented to his name and date of birth but disoriented to reality.  Pt stated "My wife is moving to [place] [employer] because she makes cakes for the parties and weddings for the family and our friends.   My daughter is getting married tomorrow.  We got 3 more cakes to make for the weddings and the doctors. We have some doctors in [place] I can't think of their name but we don't need any more but thank you anyway."  Pt denies SI/HI/A/V-hallucinations.  Pt continues to meet inpatient criteria.    Harinder Romas L. [place], [place], [facility], [facility] Therapeutic Triage Specialist  [phone]

## 2018-08-05 NOTE — ED Triage Notes (Signed)
Pt's family call to speak to DR Tyrone Nine.

## 2018-08-05 NOTE — ED Triage Notes (Signed)
Daughter [name] [name] # [phone]

## 2018-08-05 NOTE — ED Provider Notes (Signed)
I talked with the 1 of the patient's family members who is been coordinating the communications between the hospital and the family.  She after discussing with the family would like a neurologic consultation in the emergency department.  He apparently has a history of a internal carotid artery dissection.  Has been seen as an outpatient for this with his neurologist.  Was supposed to have an MRI done a couple days ago that the patient personally declined.  Discussed with Dr. Lorraine Lax, neurology. Felt this was more appropriate for outpatient workup with progressive dementia, and now behavioral disturbance, is likely the normal progression of disease.  He did feel that an MRI was warranted though did not feel that emergent MRI was needed.    Deno Etienne, DO 08/05/18 1512

## 2018-08-05 NOTE — ED Notes (Signed)
This RN spoke at length with patients sister Arbie Cookey.  She was updated on Neuro consult, she was very concerned that MRI will not get done if left to be done outpatient, family has made multiple attempts to get patient to have the test done with out success.  She would like neuro or MD to speak with Dr. Leta Baptist (pts neuro) or Dr. Lou Miner [phone].  Per family pt's symptoms have become significantly worse in last 2 weeks, she really feels he should have MRI given prior TBI.

## 2018-08-06 ENCOUNTER — Encounter (HOSPITAL_COMMUNITY): Payer: Self-pay | Admitting: *Deleted

## 2018-08-06 NOTE — ED Notes (Signed)
Diet was ordered for Breakfast.

## 2018-08-06 NOTE — ED Notes (Signed)
Due to AMS, pt has had wife and daughter visit and has talked to niece on the phone.

## 2018-08-06 NOTE — Progress Notes (Signed)
CSW spoke with patient's wife Santiago Glad to follow up with the conversation that occurred yesterday. Santiago Glad reports that she and her sister in law Arbie Cookey have been discussing all of the potential options but have not yet made any decisions. Santiago Glad provided CSW with permission to send this patient's information to two facilities including [facility] and [facility] [place] in [place].   CSW faxed patient's information to both facilities for review.  Madilyn Fireman, MSW, LCSW-A Clinical Social Worker [facility] [phone]

## 2018-08-06 NOTE — ED Notes (Signed)
Pt began pacing in room.  Walked up to NP and said, "I'm a prisoner here and I need some help. There's a body in my room.  If I don't get out now, I won't be here tomorrow."  Pt easily re-directed - speaking about [place].

## 2018-08-06 NOTE — ED Notes (Signed)
Pt calm, cooperative, accepted breathing tx without difficulty. Talking about being a green beret.

## 2018-08-06 NOTE — ED Notes (Signed)
Patient was given a drink. A Diet was ordered for Lunch.

## 2018-08-06 NOTE — Progress Notes (Signed)
CSW received called from patient's sister, Rosamaria Lints, about an update on placement for patient. Of note, patient's wife, Santiago Glad, only allowed CSW Madilyn Fireman to fax patient to [facility] and [facility] at [place] in [place]. CSW informed sister that patient was declined at both of these places due to no bed availability. Sister reports she plans to call the two facilities to further advocate for patient.   CSW will continue to follow up.   [place]en Circle, LCSW Transitions of Care Department [facility] ED [phone]

## 2018-08-06 NOTE — ED Notes (Signed)
Family at bedside. 

## 2018-08-06 NOTE — ED Provider Notes (Signed)
Patient evaluated on daily rounds.  Patient is awaiting SNF placement in a locked dementia unit.  Patient is having paranoid delusions of people wanting to kill him here.  Patient was evaluated by psychiatry who cleared him and advised Seroquel 25mg  twice daily.  Awaiting placement and bed availability.  Patient otherwise has no complaints or questions at this time.   Frederica Kuster, PA-C 08/06/18 1346    Milton Ferguson, MD 08/10/18 1252

## 2018-08-07 MED ORDER — ACETAMINOPHEN 325 MG PO TABS
650.0000 mg | ORAL_TABLET | Freq: Once | ORAL | Status: AC
  Administered 2018-08-07: 650 mg via ORAL
  Filled 2018-08-07: qty 2

## 2018-08-07 MED ORDER — ACETAMINOPHEN 325 MG PO TABS: 325.0000 mg | ORAL_TABLET | Freq: Once | ORAL | Status: DC

## 2018-08-07 NOTE — ED Notes (Signed)
Pts' brother-in-law and PCP x "35 years", Dr Elder Cyphers, called inquiring about pt's CT scan - read results per pt's POA (spouse) request. Advised he had "written a letter dated May 25th that I signed" that has a lot of his history on it". Dr Elder Cyphers asked if pt had Neuro consult w/work up. Advised him Dr Lorraine Lax was consulted. He stated he is going to attempt to call Dr Aroor himself as he feels pt needs an MRI. States pt has been aggressive at home w/spouse and refusing to take meds d/t paranoia / dementia. Aware pt is waiting on SNF facility.

## 2018-08-07 NOTE — ED Notes (Signed)
Breakfast tray ordered 

## 2018-08-07 NOTE — ED Notes (Signed)
Spouse visiting w/pt.

## 2018-08-07 NOTE — ED Provider Notes (Signed)
Patient under IVC order from wife, worsening dementia, has been cleared medically and is awaiting placement.  Patient has been offered a bed at the [facility] on the memory care unit however wife feels like this is too far away and requests closer placement.  Patient seems to be tolerating Seroquel well.  No needs at this time.   Tacy Learn, PA-C 08/07/18 1243    Lajean Saver, MD 08/07/18 [phone]

## 2018-08-07 NOTE — ED Notes (Signed)
Pt standing at nurses' desk talking w/staff.

## 2018-08-07 NOTE — ED Notes (Addendum)
Spouse visited w/pt as per Gabriel Cirri, ED Director, prior approval - per Darrold Span, RN.

## 2018-08-07 NOTE — ED Notes (Signed)
Pt's former PCP and brother-in-law, Dr Elder Cyphers, called

## 2018-08-07 NOTE — ED Notes (Signed)
After order received for Tylenol - pt states no longer needs.

## 2018-08-07 NOTE — ED Notes (Addendum)
Pt ambulatory to nurses' desk - offered for pt to be shaved - pt declined. States "[place] the train [place] on [place] and I can wait". Pt returned to room as requested.

## 2018-08-07 NOTE — ED Notes (Addendum)
Spouse leaving at this time. Pt attempting to leave also. Encouraged pt to eat dinner as he has only eaten a few bites. States he is feeling anxious d/t wants to go home. Pt sat back down in chair at bedside. Sitter talking w/pt.

## 2018-08-07 NOTE — ED Notes (Signed)
Spouse called stating she is pt's POA and that she had spoken w/brother-in-law - Dr Kathy Breach - who she wants to have info re: pt. And is requesting for him to contact RN re: Neuro consult and MRI. Spouse stated she was not aware Neuro was consulted and did not recommend need for MRI while in ED. (See note from 6/25 PM where RN spoke w/pt's sister re: same.) Also requesting if pt has a primary care MD - advised pt is being cared for EDP's.

## 2018-08-07 NOTE — ED Notes (Signed)
Pt talking to his brother, Rush Landmark, on portable phone.

## 2018-08-07 NOTE — ED Notes (Signed)
Pt was given water and orange juice.

## 2018-08-07 NOTE — ED Notes (Addendum)
IVC papers rescinded by Dr Ashok Cordia as pt has been Psych Cleared - Copy faxed to [employer], Copy sent to Medical Records - Original placed in folder for [employer].

## 2018-08-07 NOTE — ED Notes (Signed)
Pt is taking a shower at this time. 

## 2018-08-07 NOTE — ED Notes (Addendum)
Pt has ambulated to bathroom and back to room w/o difficulty.

## 2018-08-08 NOTE — ED Provider Notes (Signed)
78 year old male currently awaiting placement at SNF.  Seen on rounds this morning.  Patient sitting in chair at his doorway.  Alert, answers to questions appropriately.  Denies any complaints at this time.  Appears well.  Remains stable.   [name] [name] 08/08/18 1025    [name], MD 08/08/18 1416

## 2018-08-08 NOTE — ED Notes (Signed)
Pt's sister and brother-in-law - Dr Elder Cyphers - visited w/pt. Dr Elder Cyphers requested for RN to allow him to read pt's CT results. Advised I am unable to do so and, as we spoke yesterday, his spouse (POA) should be able to retrieve this from his 95. Voiced understanding. Advised visitors they may be receiving information re: visitation changes d/t COVID. Voiced understanding.

## 2018-08-08 NOTE — ED Notes (Signed)
Daughter and spouse visiting w/pt.

## 2018-08-08 NOTE — ED Notes (Signed)
Pt awake now. Talking w/Sitter.

## 2018-08-08 NOTE — ED Notes (Signed)
Pt in shower.  

## 2018-08-08 NOTE — ED Notes (Signed)
Sitting in doorway of room - alert.

## 2018-08-08 NOTE — ED Notes (Signed)
Breakfast tray ordered 

## 2018-08-08 NOTE — ED Notes (Signed)
Pt's daughter and spouse leaving at this time.

## 2018-08-08 NOTE — ED Notes (Signed)
Brother visiting w/pt. Pt eating lunch. Pt's daughter called asking if pt may have outside food - advised unable to do.

## 2018-08-08 NOTE — ED Notes (Signed)
Pt spoke w/his wife on phone at nurses' desk.

## 2018-08-09 MED ORDER — LORAZEPAM 2 MG/ML IJ SOLN
1.0000 mg | Freq: Once | INTRAMUSCULAR | Status: DC
  Filled 2018-08-09 (×2): qty 1

## 2018-08-09 NOTE — ED Notes (Signed)
Pt up several times to the bathroom.

## 2018-08-09 NOTE — ED Notes (Signed)
Pt sitting in hall talking with sitter.

## 2018-08-09 NOTE — Progress Notes (Signed)
Talked with patient about nebulizer treatment (Pulmicort)..  He states he has no breathing problems and takes no treatments at home.   Pt is currently not SOB and is in no distress.   He refused treatment and said he would take it if his family wanted him to.

## 2018-08-09 NOTE — ED Triage Notes (Signed)
RT coming to give  Neb treatment.

## 2018-08-09 NOTE — ED Triage Notes (Signed)
PT restless walking in and out of room to bathroom.

## 2018-08-09 NOTE — ED Notes (Addendum)
Pt's breakfast tray arrived 

## 2018-08-09 NOTE — ED Triage Notes (Signed)
Pt ran out of [place] into long hall to double locked doors . Security responded  With [employer]. Pt shouting you all have guns and  Hand grenades. Pt attempting to open double doors into Peds. Doors are locked.  TC to Pt family Arbie Cookey. Arbie Cookey talked to PT and Pt did returned to his room in the [place].

## 2018-08-09 NOTE — ED Notes (Signed)
Pt refused all medications. Becoming more agitated when questions about medications. Asking me to contact his primary care doctor to clear the medications. Threatening legal action. Clearly confused and paranoid about situation.

## 2018-08-09 NOTE — Progress Notes (Addendum)
2:20pm: CSW spoke with [place], LCSW to obtain guidance on next steps for discharge planning. TOC Supervisor and CSW discussed patient in detail and both agreed that this patient would be more appropriate for assisted living or memory care not SNF. Per RN report, patient is able to feed and bathe himself.   CSW reached out to [facility], admissions at [facility] to determine bed availability and pricing. Pamala Hurry reports that a semi private memory care room is $3,900 monthly while a private room is $4,275.  CSW spoke with patient's wife Santiago Glad to discuss the private pay option at [facility] and Santiago Glad had three questions - What is the quarantine policy for new patients? Is there accessibility to the outdoors with supervision? How do quarantined patients communicate with their families?  CSW obtained answers from [place] for [name] questions. [facility] offers a locked outdoor courtyard, [employer] is for patient's who test positive or who have high suspicion to a positive case, and family communication occurs via Company secretary, phone calls, FaceTime daily. Pamala Hurry also stated that the facility hosted 15 minute visiting appointments this weekend that were a huge success. CSW relayed all information to Santiago Glad.  1:45pm: CSW received call from [place] at [place] to further discuss patient's current options. Erline Levine and CSW discussed faxing this patient's information to [facility] in [place] and to [facility] in [place]. CSW explained to [place] that the only [employer] memory care facility in [place] was located in [facility].  CSW attempted to reach [facility] with [facility] without success, voicemail was left requesting a return call.  10:30am: CSW attempted to reach admissions staff [facility] of [facility] in [place], [place] to discuss bed availability and make a referral for this patient, however no answer so  voicemail was left requesting a return call.  9:30am: CSW met with patient and his wife Santiago Glad at bedside briefly. CSW allowed Santiago Glad to contact Lanelle Bal at the [place] to discuss options. CSW and Santiago Glad discussed options for further exploration of placement options. Santiago Glad provided CSW with consent to broaden the search for placement including being wait listed at [facility]. Santiago Glad stated that the family is also researching private pay options.  CSW spoke with Loree Fee at [facility] who stated that the waiting list for memory care is very extensive and that new names are not being added at this time. CSW reached back out to Lanelle Bal at [facility] to request this patient be added to the waiting list at the [facility] facility.  8:30am: CSW received call from Lanelle Bal at the [place] to discuss this patient. CSW informed Erline Levine of the two declinations at [place]. Erline Levine to reach out to patient's wife to discuss options and return call to [place].  7:30am: CSW attempted to reach Lanelle Bal, [phone]. [phone]) social worker at the [facility] who is assigned to this patient. There was no answer so a voicemail was left requesting a return call.   Madilyn Fireman, MSW, LCSW-A Clinical Social Worker [facility] [phone]

## 2018-08-09 NOTE — Progress Notes (Signed)
Evening ED CSW reached out to [facility] to follow up on assisted living referral. CSW notes the resident care coordinator was not available. CSW spoke with a staff member who stated she did want to speak more regarding the referral but will have to wait until she is in the office tomorrow.  Lamonte Richer, LCSW, [place] Worker II [phone]

## 2018-08-09 NOTE — ED Triage Notes (Addendum)
Pt calm sitting in chair talking with sitter

## 2018-08-09 NOTE — ED Triage Notes (Signed)
Pt  Refusing all medications because he does not take any meds.

## 2018-08-09 NOTE — ED Provider Notes (Signed)
Emergency Medicine Observation Re-evaluation Note  [name] [name] is a 78 y.o. male, seen on rounds today.  Pt initially presented to the ED for complaints of Altered Mental Status Currently, the patient is laying comfortably in bed staring at the ceiling.  Physical Exam  BP (!) 147/75 (BP Location: Left Arm)   Pulse 86   Temp 97.7 F (36.5 C) (Oral)   Resp 20   Ht 6' (1.829 m)   Wt 90 kg   SpO2 96%   BMI 26.91 kg/m  Physical Exam Pulmonary:     Effort: Pulmonary effort is normal.  Neurological:     Mental Status: He is alert.     ED Course / MDM  EKG:EKG Interpretation  Date/Time:  Wednesday August 04 2018 13:19:30 EDT Ventricular Rate:  87 PR Interval:  174 QRS Duration: 98 QT Interval:  364 QTC Calculation: 438 R Axis:   21 Text Interpretation:  Sinus rhythm Ventricular premature complex Probable left atrial enlargement Borderline repolarization abnormality Confirmed by Addison Lank [phone]) on 08/05/2018 11:07:04 AM  Clinical Course as of Aug 08 1220  Wed Aug 04, 2018  1248 Patient is with no focal neurological deficits, he has had a very gradual progression to today's events, currently I see no indication for CNS imaging.   [MB]    Clinical Course User Index [MB] Maudie Flakes, MD   I have reviewed the labs performed to date as well as medications administered while in observation.  Recent changes in the last 24 hours include N/A; no new labs recently. Plan  Current plan is for placement in SNF CSW working on placement.  Patient is not under full IVC at this time.   Eustaquio Maize, PA-C 08/09/18 1810    Charlesetta Shanks, MD 08/10/18 0800

## 2018-08-09 NOTE — ED Notes (Signed)
Pt's lunch arrived 

## 2018-08-09 NOTE — Progress Notes (Signed)
CSW received permission from Santiago Glad to pursue placement at [facility]. CSW faxed information for review to [facility] in admissions at [phone].  Madilyn Fireman, MSW, LCSW-A Clinical Social Worker [facility] [phone]

## 2018-08-09 NOTE — ED Notes (Signed)
Pt w/o sitter. Staffing says that the sitter is currently on the way. Pt bing monitored by Education officer, environmental. Pt currently asleep.

## 2018-08-09 NOTE — ED Triage Notes (Signed)
Pt has two of his Sisters visiting . The sisters encouraged Pt to take AM meds.

## 2018-08-09 NOTE — ED Triage Notes (Signed)
Pt Family Member in to see PT.  Santiago Glad wanted to rfeview Pt meds. Santiago Glad reported she wanted to make sure they were not paying for any meds he does not need. Santiago Glad also reported wanting to add a name to HIPPA form.

## 2018-08-10 ENCOUNTER — Encounter (HOSPITAL_COMMUNITY): Payer: Self-pay | Admitting: *Deleted

## 2018-08-10 DIAGNOSIS — F0390 Unspecified dementia without behavioral disturbance: Secondary | ICD-10-CM

## 2018-08-10 LAB — SARS CORONAVIRUS 2 BY RT PCR (HOSPITAL ORDER, PERFORMED IN ~~LOC~~ HOSPITAL LAB): SARS Coronavirus 2: NEGATIVE

## 2018-08-10 MED ORDER — TUBERCULIN PPD 5 UNIT/0.1ML ID SOLN
5.0000 [IU] | Freq: Once | INTRADERMAL | Status: AC
  Administered 2018-08-10: 5 [IU] via INTRADERMAL
  Filled 2018-08-10 (×3): qty 0.1

## 2018-08-10 NOTE — ED Notes (Addendum)
Pt refusing Depakote even after much encouragement. Then pt took med.

## 2018-08-10 NOTE — ED Notes (Signed)
Caffeine free beverage provided during snack pass, declined graham cracker

## 2018-08-10 NOTE — ED Notes (Signed)
Pt ambulated to the restroom w/o help.

## 2018-08-10 NOTE — ED Notes (Signed)
Pt in shower.  

## 2018-08-10 NOTE — ED Notes (Signed)
Dinner tray ordered.

## 2018-08-10 NOTE — ED Notes (Signed)
Pt was sitting in chair in hallway. Chair moved to room so pt may eat breakfast. Pt noted to be cooperative and restless.

## 2018-08-10 NOTE — ED Notes (Signed)
Pt's friend, Mikki Santee, visited - escorted to room via Security and wanded. He had given pt a coin to see and pt took the coin - refusing to return it. Sitter had advised friend of no personal belongings. Sitter was able to retrieve coin from pt after much encouragement. Coin returned to visitor. When visitor was leaving, pt ran out of [place] to him in attempt to leave w/him. Pt returned to room after much encouragement from staff.

## 2018-08-10 NOTE — Progress Notes (Signed)
2nd shift ED CSW received a handoff from the 1st shift [facility] ED CSW.   1st shift ED CSW asked that it be confirmed that a TB was placed and the CSW notes that a TB skin test was requested at approx 10:21am this morning.  CSW will continue to follow for D/C needs.  Alphonse Guild. Wayman Hoard, LCSW, LCAS, CSI Transitions of Care Clinical Social Worker Care Coordination Department Ph: [phone]

## 2018-08-10 NOTE — ED Notes (Addendum)
Snack not offered, sleeping

## 2018-08-10 NOTE — ED Notes (Signed)
Pt's friend, Teaching laboratory technician, visited w/pt.

## 2018-08-10 NOTE — ED Notes (Signed)
Pt awake at desk. Asking to get gas at gas station.

## 2018-08-10 NOTE — ED Notes (Signed)
Deputy served Forensic scientist and Guardian Ad Litem paperwork - original placed on chart - copy sent to Medical Records. Dates: Hearing on Interim Guardian - 08/11/2018 @ 1100am & Hearing Date 09/21/2018 @ 1000am.

## 2018-08-10 NOTE — ED Notes (Addendum)
Pt c/o urge to void constantly - he has been ambulating back and forth to bathroom. Bladder scan performed - 263ml noted. Pt then emptied bladder - observed voiding well in toilet. States he feels much better. Visitor w/pt. Pt eating lunch.

## 2018-08-10 NOTE — ED Notes (Signed)
Staffing has been called, was informed that there would not be anyone available until 7am to sit with pt.

## 2018-08-10 NOTE — ED Notes (Signed)
Pt ambulated to the restroom and currently sitting in a chair at bedside.

## 2018-08-10 NOTE — ED Notes (Signed)
Staffing called for update on sitter. Sitter has not shown up. Staffing informed me they cannot provide anyone at this time for coverage. Pt sleeping.

## 2018-08-10 NOTE — ED Notes (Signed)
Respiratory at bedside.

## 2018-08-10 NOTE — ED Notes (Signed)
Pt currently resting in bed

## 2018-08-10 NOTE — ED Notes (Addendum)
Spouse visiting w/pt. Asking if daily weights are being performed - advised no. Asking if po intake is being monitored - advised yes.

## 2018-08-10 NOTE — ED Notes (Signed)
Pt took brief nap - ambulated to bathroom - then back to room w/o difficulty.

## 2018-08-10 NOTE — NC FL2 (Addendum)
[place] LEVEL OF CARE SCREENING TOOL     IDENTIFICATION  Patient Name: [name] [name] Birthdate: 1940/10/14 Sex: male Admission Date (Current Location): 08/04/2018  [facility] and [place] Number:  Herbalist and Address:  [facility] [place] [address] [place], [place] [address]      Provider Number: [id number]  Attending Physician Name and Address:  Default, Provider, MD  Relative Name and Phone Number:  Trevione Wert [phone]    Current Level of Care: Hospital Recommended Level of Care: Memory Care Prior Approval Number:    Date Approved/Denied: 08/05/18 PASRR Number: [id number]  Discharge Plan: Other (Comment)(Memory Care)    Current Diagnoses: Patient Active Problem List   Diagnosis Date Noted  . Dementia ([place]) 08/10/2018  . [place] 10/03/2014  . Chronic systolic CHF (congestive heart failure) ([place]) 10/03/2014  . Obesity 09/25/2014  . Post traumatic stress disorder 06/22/2013  . Unspecified sinusitis (chronic) 06/22/2013  . Asthma exacerbation 06/22/2013  . Upper airway cough syndrome 06/22/2013  . GERD (gastroesophageal reflux disease) 06/22/2013  . Seasonal allergies 06/22/2013  . Chronic sinusitis 06/22/2013  . Acute asthma exacerbation 06/21/2013  . Intrinsic asthma 06/21/2013  . Asthma attack 06/21/2013  . Pulmonary nodule 02/12/2012  . Syncope 06/14/2011  . Facial laceration 06/14/2011  . Anxiety disorder 06/14/2011  . Allergic rhinitis 06/14/2011  . Chronic musculoskeletal pain 06/14/2011  . Left ventricular systolic dysfunction 88/28/0034  . DYSPHAGIA UNSPECIFIED 06/13/2009  . PERSONAL HISTORY OF COLONIC POLYPS 06/13/2009  . VENTRICULAR ECTOPY 08/09/2008  . ARTERIAL DISSECTION 08/09/2008  . CHEST DISCOMFORT 08/09/2008  . OBSTRUCTIVE SLEEP APNEA 11/03/2007  . ALLERGIC RHINITIS 10/26/2007  . Essential hypertension 10/25/2007    Orientation RESPIRATION BLADDER Height & Weight     Self  Normal  Continent Weight: 198 lb 6.6 oz (90 kg) Height:  6' (182.9 cm)  BEHAVIORAL SYMPTOMS/MOOD NEUROLOGICAL BOWEL NUTRITION STATUS   Patient with worsening dementia, pleasant demeanor (Dementia) Continent Diet  AMBULATORY STATUS COMMUNICATION OF NEEDS Skin   Independent Verbally Normal                       Personal Care Assistance Level of Assistance  Bathing, Total care, Dressing Bathing Assistance: Limited assistance Feeding assistance: Independent Dressing Assistance: Limited assistance Total Care Assistance: Limited assistance   Functional Limitations Info  Speech, Hearing, Sight Sight Info: Adequate Hearing Info: Adequate Speech Info: Adequate    SPECIAL CARE FACTORS FREQUENCY                       Contractures Contractures Info: Not present    Additional Factors Info  Allergies   Allergies Info: Shrimp, Contrast Media (Iodinated Diagnostic Agents), Metoprolol, Penicillins, Morphine, Oxycodone-acetampinophen           Current Medications (08/10/2018):  This is the current hospital active medication list Current Facility-Administered Medications  Medication Dose Route Frequency Provider Last Rate Last Dose  . albuterol (VENTOLIN HFA) 108 (90 Base) MCG/ACT inhaler 2 puff  2 puff Inhalation Q4H PRN Nanavati, Ankit, MD      . aspirin chewable tablet 162 mg  162 mg Oral Daily Judeth Horn, MD   162 mg at 08/09/18 1044  . aspirin EC tablet 162.5 mg  162.5 mg Oral Daily Maudie Flakes, MD   162.5 mg at 08/09/18 1055  . budesonide (PULMICORT) nebulizer solution 0.5 mg  0.5 mg Nebulization BID Kathrynn Humble, Ankit, MD   0.5 mg at 08/06/18  1956  . divalproex (DEPAKOTE SPRINKLE) capsule 250 mg  250 mg Oral TID Varney Biles, MD   Stopped at 08/09/18 1612  . dorzolamide (TRUSOPT) 2 % ophthalmic solution 1 drop  1 drop Both Eyes BID Varney Biles, MD   1 drop at 08/08/18 0911  . DULoxetine (CYMBALTA) DR capsule 30 mg  30 mg Oral Salome Arnt, MD   30 mg at 08/08/18  0910  . latanoprost (XALATAN) 0.005 % ophthalmic solution 1 drop  1 drop Right Eye QHS Varney Biles, MD   1 drop at 08/07/18 2129  . LORazepam (ATIVAN) injection 1 mg  1 mg Intramuscular Once Julianne Rice, MD   Stopped at 08/04/18 2234  . LORazepam (ATIVAN) injection 1 mg  1 mg Intramuscular Once Charlesetta Shanks, MD   Stopped at 08/09/18 1831  . losartan (COZAAR) tablet 25 mg  25 mg Oral Daily Nanavati, Ankit, MD   25 mg at 08/09/18 1044  . memantine (NAMENDA) tablet 10 mg  10 mg Oral BID Varney Biles, MD   10 mg at 08/09/18 1045  . QUEtiapine (SEROQUEL) tablet 25 mg  25 mg Oral BID Hayden Rasmussen, MD   25 mg at 08/09/18 1044  . sodium chloride flush (NS) 0.9 % injection 3 mL  3 mL Intravenous Once Virgel Manifold, MD      . tuberculin injection 5 Units  5 Units Intradermal Once Jola Schmidt, MD      . verapamil (CALAN-SR) CR tablet 120 mg  120 mg Oral Daily Maudie Flakes, MD   120 mg at 08/09/18 1055   Current Outpatient Medications  Medication Sig Dispense Refill  . albuterol (PROVENTIL) (2.5 MG/3ML) 0.083% nebulizer solution Take 3 mLs (2.5 mg total) by nebulization every 6 (six) hours as needed for wheezing or shortness of breath. 75 mL 11  . divalproex (DEPAKOTE SPRINKLE) 125 MG capsule Take 250 mg by mouth 3 (three) times daily.    . dorzolamide (TRUSOPT) 2 % ophthalmic solution Place 1 drop into both eyes 2 (two) times daily.    . fluticasone (FLONASE) 50 MCG/ACT nasal spray USE 2 SPRAYS IN EACH NOSTRIL DAILY (Patient taking differently: Place 2 sprays into both nostrils 2 (two) times daily as needed for allergies or rhinitis. ) 48 g 3  . fluticasone (FLOVENT HFA) 220 MCG/ACT inhaler Inhale 2 puffs into the lungs 2 (two) times daily. (Patient taking differently: Inhale 2 puffs into the lungs 2 (two) times daily as needed. ) 3 Inhaler 3  . latanoprost (XALATAN) 0.005 % ophthalmic solution Place 1 drop into the right eye at bedtime.    . Multiple Vitamins-Minerals (PRESERVISION  AREDS 2) CAPS Take 1 capsule by mouth 2 (two) times daily.    Marland Kitchen PROAIR HFA 108 (90 BASE) MCG/ACT inhaler INHALE 2 PUFFS INTO THE LUNGS EVERY 4 HOURS AS NEEDED FOR WHEEZING (Patient taking differently: Inhale 2 puffs into the lungs every 4 (four) hours as needed for wheezing or shortness of breath. ) 3 each 1  . aspirin 325 MG EC tablet Take 162.5 mg by mouth daily.     . DULoxetine (CYMBALTA) 30 MG capsule Take 30 mg by mouth every other day. Reported on 05/22/2015    . lidocaine (LIDODERM) 5 % APPLY ONE-HALF (1/2) PATCH TO EACH FOOT AND ONE-HALF (1/2) PATCH TO 1 SHOULDER IN THE EVENING. (Patient not taking: Reported on 06/22/2018) 135 patch 3  . losartan (COZAAR) 25 MG tablet TAKE 1 TABLET DAILY 180 tablet 2  .  memantine (NAMENDA) 10 MG tablet Take 10 mg by mouth 2 (two) times a day.    . verapamil (CALAN-SR) 120 MG CR tablet Take 120 mg by mouth daily.        Discharge Medications: Please see discharge summary for a list of discharge medications.  Relevant Imaging Results:  Relevant Lab Results:   Additional Information SSN: [id number]  Archie Endo, LCSW

## 2018-08-10 NOTE — Progress Notes (Addendum)
2:30pm: CSW received return call from [place] at [facility] who stated that the patient is officially being reviewed. The facility had a staff training and Pamala Hurry apologized for the delay. Pamala Hurry will return call to [place] when a decision is made.  10:30am: CSW received call from [place] at [facility] requesting updated FL2 and documentation for patient. CSW sent information to [facility].  CSW spoke with York Cerise, [place] to request a second COVID screening for this patient.  9am: CSW attempted to reach [place] at [facility] to discuss referral but no answer so a voicemail was left requesting a return call.  Madilyn Fireman, MSW, LCSW-A Clinical Social Worker [facility] [phone]

## 2018-08-10 NOTE — ED Notes (Signed)
Pt sitting in chair at bedside

## 2018-08-10 NOTE — ED Notes (Signed)
Coffee given to pt and spouse as requested.

## 2018-08-10 NOTE — ED Notes (Signed)
Spouse leaving at this time. States she has spoken w/someone at [facility] facility who advised they do not quarantine x 14 days but they do require 2 COVID-19 tests to be performed. Arbie Cookey, SW, aware.

## 2018-08-10 NOTE — ED Notes (Signed)
Pt now awake - attempting to ambulate in hallway. Advised pt he may ambulate in his room.

## 2018-08-11 MED ORDER — ACETAMINOPHEN 325 MG PO TABS
650.0000 mg | ORAL_TABLET | Freq: Once | ORAL | Status: DC
  Filled 2018-08-11: qty 2

## 2018-08-11 NOTE — ED Notes (Signed)
Pt's wife visiting with pt, wanded by security

## 2018-08-11 NOTE — ED Notes (Signed)
ED Provider at bedside. 

## 2018-08-11 NOTE — ED Notes (Signed)
Pt c/o hernia on L groin, showed this RN with EMT chaperoning, L side did appear slightly swollen will tell PA

## 2018-08-11 NOTE — ED Provider Notes (Signed)
   RN asked me to assess patient for painful hernia. When I spoke to the patient he tells me he has had pain to the left lower abdomen associated with swelling intermittently from a hernia for "a long time."  He also notes swelling to the scrotum for about the past week. He denies scrotal/testicular pain, urinary symptoms, fever/chills, or any other related complaints.     Physical Exam Vitals signs and nursing note reviewed.  Constitutional:      General: He is not in acute distress.    Appearance: He is well-developed. He is not diaphoretic.  HENT:     Head: Normocephalic and atraumatic.     Mouth/Throat:     Mouth: Mucous membranes are moist.     Pharynx: Oropharynx is clear.  Eyes:     Conjunctiva/sclera: Conjunctivae normal.  Neck:     Musculoskeletal: Neck supple.  Cardiovascular:     Rate and Rhythm: Normal rate and regular rhythm.     Pulses: Normal pulses.          Radial pulses are 2+ on the right side and 2+ on the left side.       Posterior tibial pulses are 2+ on the right side and 2+ on the left side.     Comments: Tactile temperature in the extremities appropriate and equal bilaterally. Pulmonary:     Effort: Pulmonary effort is normal. No respiratory distress.  Abdominal:     Palpations: Abdomen is soft.     Tenderness: There is no abdominal tenderness. There is no guarding.    Genitourinary:    Comments: Scrotum is edematous, but overall nontender.  No erythema. No pain at rest or with movement. Some pain with deep palpation toward internal inguinal canal. No hernia noted here.  The testicles are mobile and overall nontender. Musculoskeletal:     Right lower leg: No edema.     Left lower leg: No edema.  Lymphadenopathy:     Cervical: No cervical adenopathy.  Skin:    General: Skin is warm and dry.  Neurological:     Mental Status: He is alert.  Psychiatric:        Mood and Affect: Mood and affect normal.        Speech: Speech normal.        Behavior:  Behavior normal.     Overall, I think patient can follow-up with a general surgeon on an outpatient basis.  Do not find indication for emergent imaging or emergent surgical consult at this time.  Findings and plan of care discussed with [name], MD. Dr. Gilford Raid personally evaluated and examined this patient.    Lorayne Bender, PA-C 08/11/18 2340    [name], MD 08/18/18 [phone]

## 2018-08-11 NOTE — ED Notes (Signed)
Diet was ordered for Lunch. 

## 2018-08-11 NOTE — ED Notes (Signed)
Patient given a snack and drink. 

## 2018-08-11 NOTE — Progress Notes (Addendum)
2:00pm: CSW attempted to reach facility four times without an answer after attempting all extensions available.  1:30pm: CSW received call from patient's wife Santiago Glad who stated that she had multiple attempts to reach Jimmie Molly at [facility]. CSW attempted to reach [place] twice without success, CSW left message requesting return call.   9:30am: CSW spoke with Pamala Hurry at [facility] who informed CSW that this patient has been accepted and a bed offer has been made. Pamala Hurry reports that there are several other new admissions happening today and she requests that the patient come to the facility tomorrow. Pamala Hurry has been in contact with the patient's sister and wife already and will need documentation signed and completed.  CSW spoke with patient's wife Santiago Glad to inform her of plan, Santiago Glad in agreement and accepting of bed offer. Santiago Glad reports that the initial guardianship hearing is this morning at 10:30am. CSW provided Santiago Glad with contact information for [employer].  CSW updated Lowella Petties, Therapist, sports of plan.  Madilyn Fireman, MSW, LCSW-A Clinical Social Worker [facility] [phone]

## 2018-08-11 NOTE — ED Provider Notes (Signed)
Pt sleeping at time of evaluation.  Rn reports no concerns.  Pt will be discharged today to [facility]   Sidney Ace 08/11/18 1119    Pattricia Boss, MD 08/12/18 1426

## 2018-08-11 NOTE — Discharge Instructions (Addendum)
Follow-up with the general surgeon as soon as possible for evaluation of your inguinal hernias.  Call the number provided to set up an appointment.  Follow up with primary care within 3 days for re-evaluation.  Return to the ER for new or worsening symptoms or any other concerns.

## 2018-08-12 NOTE — ED Notes (Signed)
Dinner tray ordered.

## 2018-08-12 NOTE — ED Notes (Signed)
Patient denies pain and is resting comfortably.  

## 2018-08-12 NOTE — ED Provider Notes (Signed)
Patient evaluated on morning rounds.  He is pleasant and has no complaints.  He was asking when he might be able to get out of here.  CSW continues to see placement, although so far, no one has availability.  There is a possibility for home health through [facility] as well as some other options for memory care units.  CSW has updated patient's wife who is hesitant to bring the patient home considering the 24-hour care he requires.   Frederica Kuster, PA-C 08/12/18 1624    Lacretia Leigh, MD 08/15/18 [phone]

## 2018-08-12 NOTE — Progress Notes (Addendum)
3:20pm: CSW faxed additional clinical information regarding patient's behaviors to [place] at [facility] at [phone] for further review.  2:15pm: CSW spoke with Butch Penny at [facility] who stated she had no intentions of completing this patient's assessment today. CSW informed Butch Penny that this assessment needed to be completed today and as soon as possible. CSW faxed patient's FL2 and face sheet to facility for review. CSW provided Lowella Petties, RN's information to Butch Penny to contact her.  9:45am: CSW spoke with patient's wife who stated there are beds available at [facility]. CSW spoke with Bethena Roys at [facility] who confirmed bed availability. CSW provided Bethena Roys with contact information for patient's RN Lowella Petties to complete assessment. Bethena Roys to return call to [place] whenever further information is available.  8:30am: CSW received call from patient's wife [name] [name] who stated that she cannot get an answer from staff at [facility]. [name] [name] requested that CSW reach out to [facility] staff again to check bed availability. CSW left message with Dorene Sorrow [phone]) at [facility] to check bad availability.  CSW spoke with Jimmie Molly who stated there are confirmed positive COVID patients at the facility so now new admissions are not being accepted.   CSW informed patient's wife of information and discussed the possibility of discharging the patient home with her with home health services. [name] [name] was agreeable to considering the idea and stated she wanted to call her sister in law [name]. CSW will return call to [name] [name] shortly.  [name] [name], MSW, LCSW-A Clinical Social Worker [facility] [phone]

## 2018-08-12 NOTE — ED Notes (Signed)
Attempted to fax nursing notes as requested by [facility] three times. Awaiting call back to see if this last attempt went through

## 2018-08-13 LAB — VALPROIC ACID LEVEL: Valproic Acid Lvl: 41 ug/mL — ABNORMAL LOW (ref 50.0–100.0)

## 2018-08-13 MED ORDER — LORAZEPAM 1 MG PO TABS
1.0000 mg | ORAL_TABLET | Freq: Once | ORAL | Status: DC
  Filled 2018-08-13: qty 1

## 2018-08-13 MED ORDER — BUDESONIDE 180 MCG/ACT IN AEPB: 1.0000 | INHALATION_SPRAY | Freq: Two times a day (BID) | RESPIRATORY_TRACT | Status: DC

## 2018-08-13 MED ORDER — BUDESONIDE 0.25 MG/2ML IN SUSP
0.2500 mg | Freq: Two times a day (BID) | RESPIRATORY_TRACT | Status: DC
  Administered 2018-08-16 – 2018-08-18 (×4): 0.25 mg via RESPIRATORY_TRACT
  Filled 2018-08-13 (×8): qty 2

## 2018-08-13 NOTE — Progress Notes (Addendum)
1:30pm: CSW received call from patient's sister Rosamaria Lints who wanted an update on the patient's placement. CSW informed her of updates regarding information that was sent to [facility] this morning. Arbie Cookey requesting that Jud also send patient's information to [facility] on [address] and also [facility] in [place]. Arbie Cookey inquired with CSW as to why the patient's wife Santiago Glad is the only visitor allowed. CSW explained visitor policy and restrictions due to COVID, Arbie Cookey stated understanding but continued to ask if patient's PCP Dr. Elder Cyphers was able to visit. CSW reiterated consistent policies for all patients and informed Arbie Cookey that the patient's wife was the only visitor allowed. Arbie Cookey also requested that the patient receive an "upped dose of Depakote" at night to keep him from becoming agitated, CSW agreed to inform RN. CSW informed RN Curt Bears of family request.  CSW spoke with Mardene Celeste at [facility] who statedthat the facility will have a bed available next Tuesday. Mardene Celeste agreed to review the patient's information. CSW faxed information to [phone]. CSW faxed patient's information via the HUB to [facility] on [address].  7:40am: CSW received call from [place] at [facility] requesting notes from the patient's psych evaluation by TTS. CSW sent over initial TTS assessment from 08/04/2018.  Madilyn Fireman, MSW, LCSW-A Clinical Social Worker [facility] [phone]

## 2018-08-13 NOTE — ED Notes (Signed)
Pt requested to shave. Called 3[facility] to see if they have a razor and shaving cream. They will tube it down.

## 2018-08-13 NOTE — ED Notes (Signed)
Patient at nurses station speaking with daughter on phone. Patient requesting to leave hospital. Patient redirected and asked to return to room.

## 2018-08-13 NOTE — ED Notes (Signed)
Patients family calling and requesting changes in patients current medications. PA notified.

## 2018-08-13 NOTE — ED Notes (Signed)
Breakfast tray ordered 

## 2018-08-13 NOTE — ED Notes (Signed)
Patient was given a Cup of water. A Diet was called in for Lunch.

## 2018-08-13 NOTE — ED Provider Notes (Signed)
Emergency Medicine Observation Re-evaluation Note  [name] [name] is a 78 y.o. male, seen on rounds today.  Pt initially presented to the ED for complaints of Altered Mental Status Currently, the patient is awake, sitting on his bed with wife visiting. Requests to go back home (wife indicates this is not an option).  Physical Exam  BP (!) 157/75 (BP Location: Left Arm)   Pulse 61   Temp 97.8 F (36.6 C) (Oral)   Resp 18   Ht 6' (1.829 m)   Wt 90 kg   SpO2 98%   BMI 26.91 kg/m  Physical Exam Awake, alert, hard of hearing, no needs at this time. ED Course / MDM  EKG:EKG Interpretation  Date/Time:  Wednesday August 04 2018 13:19:30 EDT Ventricular Rate:  87 PR Interval:  174 QRS Duration: 98 QT Interval:  364 QTC Calculation: 438 R Axis:   21 Text Interpretation:  Sinus rhythm Ventricular premature complex Probable left atrial enlargement Borderline repolarization abnormality Confirmed by Addison Lank [phone]) on 08/05/2018 11:07:04 AM  Clinical Course as of Aug 13 930  Wed Aug 04, 2018  1248 Patient is with no focal neurological deficits, he has had a very gradual progression to today's events, currently I see no indication for CNS imaging.   [MB]    Clinical Course User Index [MB] Maudie Flakes, MD   I have reviewed the labs performed to date as well as medications administered while in observation.  Recent changes in the last 24 hours include none. Plan  Current plan is for admit to [facility], awaiting contact after 9AM today. Has order for Pulmicort neb, will look into changing this to MDI of patient can complete MDI treatment.    Tacy Learn, PA-C 08/13/18 [place], [place], DO 08/13/18 1252

## 2018-08-14 NOTE — ED Notes (Signed)
Pt eating breakfast. Spouse called and stated she is planning to visit at 1230 time.

## 2018-08-14 NOTE — ED Notes (Signed)
Spouse visiting w/pt.

## 2018-08-14 NOTE — ED Notes (Signed)
Spouse leaving at this time.  

## 2018-08-14 NOTE — Care Management (Signed)
ED CM reviewed patient's record, and spoke to ED CSW who continues to work on a transitional care plan for placement, possible Tuesday 7/7.

## 2018-08-14 NOTE — ED Provider Notes (Signed)
78 year old male seen on rounds today, awaiting placement at skilled nursing facility with memory care unit.  Case worker continues to work on disposition for patient, possible placement on July 7.  Patient has been compliant with medications today.  Depakote level checked yesterday, slightly low however patient was not compliant with medication yesterday.  No medication adjustments made today.   Tacy Learn, PA-C 08/14/18 [place], DO 08/14/18 1413

## 2018-08-14 NOTE — ED Notes (Signed)
Pt took meds w/o difficulty.

## 2018-08-14 NOTE — ED Notes (Signed)
Breakfast tray ordered 

## 2018-08-14 NOTE — ED Notes (Signed)
Lunch delivered to pt.

## 2018-08-14 NOTE — ED Notes (Signed)
Pt on phone w/son, Ronalee Belts.

## 2018-08-14 NOTE — ED Notes (Signed)
Pt took Depakote w/o difficulty.

## 2018-08-15 NOTE — ED Notes (Signed)
Pt in shower.  

## 2018-08-15 NOTE — ED Notes (Signed)
Pt has been noted to be ambulating back and forth to bathroom w/o difficulty.

## 2018-08-15 NOTE — ED Notes (Signed)
Spouse visiting w/pt - brought Wedding album to show pt.

## 2018-08-15 NOTE — ED Notes (Signed)
Pt performing karate kicks for staff w/o difficulty.

## 2018-08-15 NOTE — ED Notes (Signed)
Spouse has arrived to visit.

## 2018-08-15 NOTE — ED Provider Notes (Signed)
78 year old male seen in rounds today, awaiting placement at skilled nursing facility memory care unit.  Review of notes shows hopeful disposition on July 7.  Patient was seen this morning, was sleeping at that time, report from staff that patient is taking his medications well.  No needs at this time today.   Tacy Learn, PA-C 08/15/18 1554    Quintella Reichert, MD 08/15/18 [phone]

## 2018-08-15 NOTE — ED Notes (Signed)
Pharmacy advised they only have po meds for fever blister/cold sores.

## 2018-08-15 NOTE — ED Notes (Signed)
Pt took meds w/o difficulty.

## 2018-08-15 NOTE — ED Notes (Addendum)
Spouse leaving at this time - took wedding album home - requesting for pt to be given med for what appears to be fever blister/cold sore to right upper and lower lips. Spouse requested Abreva. Message sent to APP.

## 2018-08-16 NOTE — ED Notes (Signed)
Lunch ordered 

## 2018-08-16 NOTE — Progress Notes (Addendum)
11:15am: CSW received call from Ken[place] at [facility] who stated that the the RN is not at the facility today, so the patient's information will be given to the regional director first thing tomorrow morning. Bethena Roys will communicate with patient's wife Santiago Glad and will return call to [place] tomorrow whenever a decision has been made.  9am: [facility]. CSW updated Santiago Glad with information obtained from [place] this morning. CSW to call wife whenever further information is obtained.  8:40am: CSW spoke with Bethena Roys at [facility] who stated she would speak to the team at the morning meeting at 10am this morning to discuss the status of this patient. Bethena Roys to return CSW call after meeting.  7:55am: CSW received call from patient's wife Santiago Glad who states she is coming to the hospital now to visit her husband, requested that CSW meet her there. CSW agreed to meet wife at bedside during her visit.  7:30am: CSW attempted to reach admissions staff at [facility] twice without success.  CSW attempted to reach patient's wife Santiago Glad to follow up regarding referrals that were made on the patient's behalf, however no answer so a voicemail was left requesting a return call.  Madilyn Fireman, MSW, LCSW-A Clinical Social Worker [facility] [phone]

## 2018-08-17 NOTE — NC FL2 (Signed)
[place] LEVEL OF CARE SCREENING TOOL     IDENTIFICATION  Patient Name: [name] [name] Birthdate: [birth year] Sex: male Admission Date (Current Location): 08/04/2018  [facility] and [place] Number:  Herbalist and Address:  [facility] [place], [place] [address], [place], [address] [address]      Provider Number: [id number]  Attending Physician Name and Address:  Default, Provider, MD  Relative Name and Phone Number:  Erubiel Manasco [phone]    Current Level of Care: Hospital Recommended Level of Care: [place] Prior Approval Number:    Date Approved/Denied: 08/05/18 PASRR Number: [id number]  Discharge Plan: ([place])    Current Diagnoses: Patient Active Problem List   Diagnosis Date Noted  . Dementia ([place]) 08/10/2018  . Bigeminy 10/03/2014  . Chronic systolic CHF (congestive heart failure) ([place]) 10/03/2014  . Obesity 09/25/2014  . Post traumatic stress disorder 06/22/2013  . Unspecified sinusitis (chronic) 06/22/2013  . Asthma exacerbation 06/22/2013  . Upper airway cough syndrome 06/22/2013  . GERD (gastroesophageal reflux disease) 06/22/2013  . Seasonal allergies 06/22/2013  . Chronic sinusitis 06/22/2013  . Acute asthma exacerbation 06/21/2013  . Intrinsic asthma 06/21/2013  . Asthma attack 06/21/2013  . Pulmonary nodule 02/12/2012  . Syncope 06/14/2011  . Facial laceration 06/14/2011  . Anxiety disorder 06/14/2011  . Allergic rhinitis 06/14/2011  . Chronic musculoskeletal pain 06/14/2011  . Left ventricular systolic dysfunction 93/79/0240  . DYSPHAGIA UNSPECIFIED 06/13/2009  . PERSONAL HISTORY OF COLONIC POLYPS 06/13/2009  . VENTRICULAR ECTOPY 08/09/2008  . ARTERIAL DISSECTION 08/09/2008  . CHEST DISCOMFORT 08/09/2008  . OBSTRUCTIVE SLEEP APNEA 11/03/2007  . ALLERGIC RHINITIS 10/26/2007  . Essential hypertension 10/25/2007    Orientation RESPIRATION BLADDER Height & Weight      Self, Place  Normal Continent Weight: 198 lb 6.6 oz (90 kg) Height:  6' (182.9 cm)  BEHAVIORAL SYMPTOMS/MOOD NEUROLOGICAL BOWEL NUTRITION STATUS    (Dementia) Continent Diet(Regular)  AMBULATORY STATUS COMMUNICATION OF NEEDS Skin   Independent Verbally Normal                       Personal Care Assistance Level of Assistance  Bathing, Total care, Dressing Bathing Assistance: Independent Feeding assistance: Independent Dressing Assistance: Independent Total Care Assistance: Independent   Functional Limitations Info  Speech, Hearing, Sight Sight Info: Adequate Hearing Info: Adequate Speech Info: Adequate    SPECIAL CARE FACTORS FREQUENCY                       Contractures Contractures Info: Not present    Additional Factors Info  Allergies, Code Status Code Status Info: FULL Allergies Info: Shrimp (Shellfish Allergy), Contrast Media (Iodinated Diagnostic Agents), Metoprolol, Morphine, Oxycodone-acetaminophen, Penicillins           Current Medications (08/17/2018):  This is the current hospital active medication list Current Facility-Administered Medications  Medication Dose Route Frequency Provider Last Rate Last Dose  . acetaminophen (TYLENOL) tablet 650 mg  650 mg Oral Once Sofia, Leslie K, PA-C      . albuterol (VENTOLIN HFA) 108 (90 Base) MCG/ACT inhaler 2 puff  2 puff Inhalation Q4H PRN Kathrynn Humble, Ankit, MD      . aspirin chewable tablet 162 mg  162 mg Oral Daily Judeth Horn, MD   162 mg at 08/17/18 9735  . budesonide (PULMICORT) nebulizer solution 0.25 mg  0.25 mg Nebulization BID Suella Broad A, PA-C   0.25 mg at 08/17/18 3299  .  divalproex (DEPAKOTE SPRINKLE) capsule 250 mg  250 mg Oral TID Varney Biles, MD   250 mg at 08/17/18 1626  . dorzolamide (TRUSOPT) 2 % ophthalmic solution 1 drop  1 drop Both Eyes BID Varney Biles, MD   1 drop at 08/17/18 0952  . DULoxetine (CYMBALTA) DR capsule 30 mg  30 mg Oral Salome Arnt, MD   30 mg at  08/16/18 0957  . latanoprost (XALATAN) 0.005 % ophthalmic solution 1 drop  1 drop Right Eye QHS Varney Biles, MD   1 drop at 08/15/18 2209  . LORazepam (ATIVAN) injection 1 mg  1 mg Intramuscular Once Charlesetta Shanks, MD   Stopped at 08/09/18 1831  . LORazepam (ATIVAN) tablet 1 mg  1 mg Oral Once Tacy Learn, PA-C      . losartan (COZAAR) tablet 25 mg  25 mg Oral Daily Varney Biles, MD   25 mg at 08/17/18 0951  . memantine (NAMENDA) tablet 10 mg  10 mg Oral BID Varney Biles, MD   10 mg at 08/17/18 0956  . QUEtiapine (SEROQUEL) tablet 25 mg  25 mg Oral BID Hayden Rasmussen, MD   25 mg at 08/17/18 0951  . sodium chloride flush (NS) 0.9 % injection 3 mL  3 mL Intravenous Once Virgel Manifold, MD      . verapamil (CALAN-SR) CR tablet 120 mg  120 mg Oral Daily Maudie Flakes, MD   120 mg at 08/17/18 4081   Current Outpatient Medications  Medication Sig Dispense Refill  . albuterol (PROVENTIL) (2.5 MG/3ML) 0.083% nebulizer solution Take 3 mLs (2.5 mg total) by nebulization every 6 (six) hours as needed for wheezing or shortness of breath. 75 mL 11  . divalproex (DEPAKOTE SPRINKLE) 125 MG capsule Take 250 mg by mouth 3 (three) times daily.    . dorzolamide (TRUSOPT) 2 % ophthalmic solution Place 1 drop into both eyes 2 (two) times daily.    . fluticasone (FLONASE) 50 MCG/ACT nasal spray USE 2 SPRAYS IN EACH NOSTRIL DAILY (Patient taking differently: Place 2 sprays into both nostrils 2 (two) times daily as needed for allergies or rhinitis. ) 48 g 3  . fluticasone (FLOVENT HFA) 220 MCG/ACT inhaler Inhale 2 puffs into the lungs 2 (two) times daily. (Patient taking differently: Inhale 2 puffs into the lungs 2 (two) times daily as needed. ) 3 Inhaler 3  . latanoprost (XALATAN) 0.005 % ophthalmic solution Place 1 drop into the right eye at bedtime.    . Multiple Vitamins-Minerals (PRESERVISION AREDS 2) CAPS Take 1 capsule by mouth 2 (two) times daily.    Marland Kitchen PROAIR HFA 108 (90 BASE) MCG/ACT inhaler  INHALE 2 PUFFS INTO THE LUNGS EVERY 4 HOURS AS NEEDED FOR WHEEZING (Patient taking differently: Inhale 2 puffs into the lungs every 4 (four) hours as needed for wheezing or shortness of breath. ) 3 each 1  . aspirin 325 MG EC tablet Take 162.5 mg by mouth daily.     . DULoxetine (CYMBALTA) 30 MG capsule Take 30 mg by mouth every other day. Reported on 05/22/2015    . lidocaine (LIDODERM) 5 % APPLY ONE-HALF (1/2) PATCH TO EACH FOOT AND ONE-HALF (1/2) PATCH TO 1 SHOULDER IN THE EVENING. (Patient not taking: Reported on 06/22/2018) 135 patch 3  . losartan (COZAAR) 25 MG tablet TAKE 1 TABLET DAILY 180 tablet 2  . memantine (NAMENDA) 10 MG tablet Take 10 mg by mouth 2 (two) times a day.    . verapamil (CALAN-SR)  120 MG CR tablet Take 120 mg by mouth daily.        Discharge Medications: Please see discharge summary for a list of discharge medications.  Relevant Imaging Results:  Relevant Lab Results:   Additional Information [id number]  Alphonse Guild Ziggy Reveles, LCSW

## 2018-08-17 NOTE — ED Notes (Signed)
Pt had phone call with sister

## 2018-08-17 NOTE — Progress Notes (Addendum)
3pm: CSW received call from Director of Nursing at [facility] [place] who stated she received the additional nursing notes that were sent. Loree Fee is requesting a video of this patient walking and functioning due to the inability for a [facility] staff member to come to [facility] to meet with and assess the patient. CSW informed Loree Fee that CSW would have to speak with ED Director Gabriel Cirri and [employer] regarding how to fulfill this need. CSW contacted both Interior and spatial designer via email.  2pm: CSW additional documentation (nursing notes) to [facility] for review.   9:30am: CSW spoke with [facility] to discuss current plan. Santiago Glad agreeable to plan and will contact CSW with any information regarding facility acceptance.  Madilyn Fireman, MSW, LCSW-A Clinical Social Worker [facility] [phone]

## 2018-08-17 NOTE — ED Provider Notes (Signed)
78 year old male seen on rounds today, awaiting placement at skilled nursing facility with memory care.  Patient is awake, alert, ambulatory without assistance.  Patient is currently transitioning from chair in his doorway to chair in his room to sit up for lunch.  No needs at this time.   Tacy Learn, PA-C 08/17/18 1319    Charlesetta Shanks, MD 08/22/18 [phone]

## 2018-08-17 NOTE — ED Notes (Signed)
Pt has been ambulatory to back and forth to bathroom.

## 2018-08-17 NOTE — Progress Notes (Addendum)
CSW received a call from Mardene Celeste at [facility] who stated they were prepared possibly to accept the pt tomorrow but would first need:  1.  A short video of the pt interacting with someone and ambulating a short distance so that Mardene Celeste could say she has laid eyes on the patient prior to accepting the pt.  Mardene Celeste stated the video could be filmed on a phone with the permission of the family and then the video can be texted to her phone at ph: [phone].  2.  Pt's initial RN/Progress notes from pt's ED triage notes (6/24) up to and including 6/30 should be faxed to: Fax: [phone], so that the RN at [facility] can review them prior to accepting the pt.  11:15 PM FL-2 and progress notes faxed.  CSW also emailed these to the 1st shift ED CSW in case the fax did not go through to [place].  CSW will continue to follow for D/C needs.  Alphonse Guild. Deiona Hooper, LCSW, LCAS, CSI Transitions of Care Clinical Social Worker Care Coordination Department Ph: [phone]

## 2018-08-17 NOTE — ED Notes (Signed)
Breakfast tray ordered 

## 2018-08-17 NOTE — Progress Notes (Signed)
CSW created an updated FL-2 and sent with progress notes (6/24-6/30) as requested by Mardene Celeste at [facility] to fax: [phone] (ph: [phone]).  Of note: RN states unable to create short video, but Mardene Celeste at [place] needs either [employer] or a short video of pt texted to or emailed to her tomorrow.  This CSW is aware this has been done for assessment purposes in the ED in the past.  2nd shift ED CSW will leave handoff for 1st shift ED CSW.  CSW will continue to follow for D/C needs.  Alphonse Guild. Jean Skow, LCSW, LCAS, CSI Transitions of Care Clinical Social Worker Care Coordination Department Ph: [phone]

## 2018-08-18 ENCOUNTER — Emergency Department (HOSPITAL_COMMUNITY): Payer: No Typology Code available for payment source

## 2018-08-18 DIAGNOSIS — F0391 Unspecified dementia with behavioral disturbance: Secondary | ICD-10-CM | POA: Diagnosis not present

## 2018-08-18 LAB — TSH: TSH: 1.436 u[IU]/mL (ref 0.350–4.500)

## 2018-08-18 LAB — VITAMIN B12: Vitamin B-12: 381 pg/mL (ref 180–914)

## 2018-08-18 LAB — FOLATE: Folate: 11 ng/mL (ref >5.9)

## 2018-08-18 MED ORDER — QUETIAPINE FUMARATE 25 MG PO TABS
25.0000 mg | ORAL_TABLET | Freq: Two times a day (BID) | ORAL | Status: AC
  Administered 2018-08-18: 25 mg via ORAL
  Filled 2018-08-18: qty 1

## 2018-08-18 MED ORDER — DONEPEZIL HCL 5 MG PO TABS
5.0000 mg | ORAL_TABLET | Freq: Every day | ORAL | Status: DC
  Administered 2018-08-18 – 2018-08-20 (×3): 5 mg via ORAL
  Filled 2018-08-18 (×4): qty 1

## 2018-08-18 NOTE — ED Provider Notes (Signed)
[phone].  Dr. Elder Cyphers, patient's brother in law, requests neuro consult w possible additional imaging.  He would also appreciate a call regarding results.   Carmin Muskrat, MD 08/18/18 1500

## 2018-08-18 NOTE — ED Notes (Signed)
Neuro MD Leonel Ramsay at beside.

## 2018-08-18 NOTE — ED Notes (Signed)
Pt given nightly snack 

## 2018-08-18 NOTE — ED Notes (Signed)
Wife wants MD to arranged a neurology consult as well medication changes to make Pt feel comfortable and not fearful around new people.  *RN informed Wife as part of dementia Pts' are sometimes paranoid around new people until he/she gets to know them

## 2018-08-18 NOTE — ED Notes (Signed)
Patient transported to MRI 

## 2018-08-18 NOTE — Consult Note (Addendum)
Neurology Consultation  Reason for Consult: Significant decrease in memory Referring Physician: Vanita Panda  History is obtained from: Lou Miner who is brother-in-law and was his MD for quite a while  HPI: [name] [name] is a 78 y.o. 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  I have talked to Dr. Elder Cyphers in depth, as patient is unable to give me any history.  Dr. Elder Cyphers tells me that when he initially was taking care of the patient, he was very bright.  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  He is highly decorated.   According to his wife and Dr. guess, somewhere around 2015 or 2016, the patient began having a significant decline in his memory and functioning.  In Jul 02, 2018 he was making superficial, inappropriate comments, talking nonsensically and talking about [employer] and other missions that are not currently occuring.  Recently he has become very hostile and paranoid- believing that his wife is trying to kill him, becoming lost when driving, and progressively losing his memory.  He did have a primary neurologist in the past which was Dr. love of [facility] neurology who is since retired.  He did state in the past, around 2016 due to his decline in memory he was told to see a neurologist, but would continually refused this.  The reason he was brought to [facility] as he had a period of paranoia that he was back at war, and people were after him thus he ran into the woods.  At that point in time both police and helicopters were sent out to find him.  Thus he was placed in [facility]  As stated Dr. Harle Battiest whose number is [phone] has significant amount of information if anyone practitioner needs any more information.  Patient's POA is his wife.   ED course -CT head,  labs  ROS:  Unable to obtain due to altered mental status.   Past Medical History:  Diagnosis Date  . Adenomatous colon polyp   . Anal fissure   . Anxiety disorder   . Arthritis   . Asthma   . Cardiomyopathy    EF of 40-45%  . Dementia ([place])   . Dementia ([place])   . Dissection of carotid artery ([place])    history  . Dissection of renal artery (H[facility])    history  . Diverticulosis   . Gallstones   . GERD (gastroesophageal reflux disease)   . Glaucoma   . History of agent Orange exposure   . History of hepatitis 1969   [place], unknown what type  . HTN (hypertension)   . PVC (premature ventricular contraction)   . Retinopathy    related to PVC's    Family History  Problem Relation Age of Onset  . Heart disease Father   . Colon cancer Other        uncle   Social History:   reports that he quit smoking about 30 years ago. His smoking use included cigarettes. He has a 7.50 pack-year smoking history. He has never used smokeless tobacco. He reports current alcohol use. He reports that he does not use drugs.  Medications  Current Facility-Administered Medications:  .  acetaminophen (TYLENOL) tablet 650 mg, 650 mg, Oral, Once, Sofia, Leslie K, PA-C .  albuterol (VENTOLIN HFA) 108 (90 Base) MCG/ACT inhaler 2 puff, 2  puff, Inhalation, Q4H PRN, Nanavati, Ankit, MD .  aspirin chewable tablet 162 mg, 162 mg, Oral, Daily, Judeth Horn, MD, 162 mg at 08/18/18 0954 .  budesonide (PULMICORT) nebulizer solution 0.25 mg, 0.25 mg, Nebulization, BID, Tacy Learn, PA-C, 0.25 mg at 08/18/18 0802 .  divalproex (DEPAKOTE SPRINKLE) capsule 250 mg, 250 mg, Oral, TID, Nanavati, Ankit, MD, 250 mg at 08/18/18 0954 .  dorzolamide (TRUSOPT) 2 % ophthalmic solution 1 drop, 1 drop, Both Eyes, BID, Nanavati, Ankit, MD, 1 drop at 08/18/18 0955 .  DULoxetine (CYMBALTA) DR capsule 30 mg, 30 mg, Oral, Salome Arnt, MD, 30 mg at 08/18/18 0954 .  latanoprost (XALATAN) 0.005 % ophthalmic solution 1  drop, 1 drop, Right Eye, QHS, Nanavati, Ankit, MD, 1 drop at 08/15/18 2209 .  LORazepam (ATIVAN) injection 1 mg, 1 mg, Intramuscular, Once, Charlesetta Shanks, MD, Stopped at 08/09/18 1831 .  LORazepam (ATIVAN) tablet 1 mg, 1 mg, Oral, Once, Tacy Learn, PA-C .  losartan (COZAAR) tablet 25 mg, 25 mg, Oral, Daily, Nanavati, Ankit, MD, 25 mg at 08/18/18 0955 .  memantine (NAMENDA) tablet 10 mg, 10 mg, Oral, BID, Nanavati, Ankit, MD, 10 mg at 08/18/18 0954 .  QUEtiapine (SEROQUEL) tablet 25 mg, 25 mg, Oral, BID, Hayden Rasmussen, MD, 25 mg at 08/18/18 0955 .  sodium chloride flush (NS) 0.9 % injection 3 mL, 3 mL, Intravenous, Once, Virgel Manifold, MD .  verapamil (CALAN-SR) CR tablet 120 mg, 120 mg, Oral, Daily, Maudie Flakes, MD, 120 mg at 08/18/18 3614  Current Outpatient Medications:  .  albuterol (PROVENTIL) (2.5 MG/3ML) 0.083% nebulizer solution, Take 3 mLs (2.5 mg total) by nebulization every 6 (six) hours as needed for wheezing or shortness of breath., Disp: 75 mL, Rfl: 11 .  divalproex (DEPAKOTE SPRINKLE) 125 MG capsule, Take 250 mg by mouth 3 (three) times daily., Disp: , Rfl:  .  dorzolamide (TRUSOPT) 2 % ophthalmic solution, Place 1 drop into both eyes 2 (two) times daily., Disp: , Rfl:  .  fluticasone (FLONASE) 50 MCG/ACT nasal spray, USE 2 SPRAYS IN EACH NOSTRIL DAILY (Patient taking differently: Place 2 sprays into both nostrils 2 (two) times daily as needed for allergies or rhinitis. ), Disp: 48 g, Rfl: 3 .  fluticasone (FLOVENT HFA) 220 MCG/ACT inhaler, Inhale 2 puffs into the lungs 2 (two) times daily. (Patient taking differently: Inhale 2 puffs into the lungs 2 (two) times daily as needed. ), Disp: 3 Inhaler, Rfl: 3 .  latanoprost (XALATAN) 0.005 % ophthalmic solution, Place 1 drop into the right eye at bedtime., Disp: , Rfl:  .  Multiple Vitamins-Minerals (PRESERVISION AREDS 2) CAPS, Take 1 capsule by mouth 2 (two) times daily., Disp: , Rfl:  .  PROAIR HFA 108 (90 BASE) MCG/ACT  inhaler, INHALE 2 PUFFS INTO THE LUNGS EVERY 4 HOURS AS NEEDED FOR WHEEZING (Patient taking differently: Inhale 2 puffs into the lungs every 4 (four) hours as needed for wheezing or shortness of breath. ), Disp: 3 each, Rfl: 1 .  aspirin 325 MG EC tablet, Take 162.5 mg by mouth daily. , Disp: , Rfl:  .  DULoxetine (CYMBALTA) 30 MG capsule, Take 30 mg by mouth every other day. Reported on 05/22/2015, Disp: , Rfl:  .  lidocaine (LIDODERM) 5 %, APPLY ONE-HALF (1/2) PATCH TO EACH FOOT AND ONE-HALF (1/2) PATCH TO 1 SHOULDER IN THE EVENING. (Patient not taking: Reported on 06/22/2018), Disp: 135 patch, Rfl: 3 .  losartan (COZAAR) 25 MG tablet, TAKE  1 TABLET DAILY, Disp: 180 tablet, Rfl: 2 .  memantine (NAMENDA) 10 MG tablet, Take 10 mg by mouth 2 (two) times a day., Disp: , Rfl:  .  verapamil (CALAN-SR) 120 MG CR tablet, Take 120 mg by mouth daily. , Disp: , Rfl:    Exam: Current vital signs: BP (!) 164/85 (BP Location: Right Arm)   Pulse 88   Temp 97.6 F (36.4 C) (Oral)   Resp 18   Ht 6' (1.829 m)   Wt 90 kg   SpO2 97%   BMI 26.91 kg/m  Vital signs in last 24 hours: Temp:  [97.6 F (36.4 C)-97.7 F (36.5 C)] 97.6 F (36.4 C) (07/08 1130) Pulse Rate:  [55-88] 88 (07/08 1130) Resp:  [15-18] 18 (07/08 1130) BP: (146-164)/(81-85) 164/85 (07/08 1130) SpO2:  [97 %-98 %] 97 % (07/08 1130)  Physical Exam  Constitutional: Appears well-developed and well-nourished.  Psych: Affect and appropriate to situation as he has no idea why he is been placed in the hospital Eyes: No scleral injection HENT: No OP obstrucion Head: Normocephalic.  Cardiovascular: Normal rate and regular rhythm.  Respiratory: Effort normal, non-labored breathing GI: Soft.  No distension. There is no tenderness.  Skin: WDI  Neuro: Mental Status: Patient is awake and alert but does not realize the month, date, or why he is here and complains of a hernia rather than confusion.  He does not think he is confused.  He is able  to name objects but with concrete thinking such as: When asked what does a "rolling rock gathers no moss mean"- his response to me was, "if he threw a rock at me it will not give me dementia and gather odd things". Cranial Nerves: II: Visual Fields are full.  III,IV, VI: EOMI without ptosis or diploplia. Pupils equal, round and reactive to light V: Facial sensation is symmetric to temperature VII: Facial movement is symmetric.  VIII: hearing is intact to voice X: Palat elevates symmetrically XI: Shoulder shrug is symmetric. XII: tongue is midline without atrophy or fasciculations.  Motor: Tone is normal. Bulk is normal. 5/5 strength was present in all four extremities.  Sensory: Sensation is symmetric to light touch and temperature in the arms and legs. Deep Tendon Reflexes: 2+ and symmetric in the biceps and patellae.  Plantars: Toes are downgoing bilaterally.  Cerebellar: FNF and HKS are intact bilaterally  Labs I have reviewed labs in epic and the results pertinent to this consultation are:   CBC    Component Value Date/Time   WBC 7.8 08/04/2018 1126   RBC 4.98 08/04/2018 1126   HGB 15.0 08/04/2018 1134   HCT 44.0 08/04/2018 1134   PLT 243 08/04/2018 1126   MCV 92.8 08/04/2018 1126   MCV 95.2 06/21/2013 1703   MCH 30.7 08/04/2018 1126   MCHC 33.1 08/04/2018 1126   RDW 12.5 08/04/2018 1126   LYMPHSABS 0.8 08/04/2018 1126   MONOABS 0.6 08/04/2018 1126   EOSABS 0.0 08/04/2018 1126   BASOSABS 0.0 08/04/2018 1126    CMP     Component Value Date/Time   NA 141 08/04/2018 1134   K 3.5 08/04/2018 1134   CL 106 08/04/2018 1134   CO2 24 08/04/2018 1126   GLUCOSE 108 (H) 08/04/2018 1134   BUN 17 08/04/2018 1134   CREATININE 0.80 08/04/2018 1134   CREATININE 0.89 06/09/2013 1604   CALCIUM 9.3 08/04/2018 1126   PROT 6.8 08/04/2018 1126   ALBUMIN 4.4 08/04/2018 1126   AST 23 08/04/2018  1126   ALT 16 08/04/2018 1126   ALKPHOS 58 08/04/2018 1126   BILITOT 1.8 (H)  08/04/2018 1126   GFRNONAA >60 08/04/2018 1126   GFRAA >60 08/04/2018 1126    Lipid Panel     Component Value Date/Time   CHOL 157 01/27/2014 0734   TRIG 121.0 01/27/2014 0734   HDL 44.00 01/27/2014 0734   CHOLHDL 4 01/27/2014 0734   VLDL 24.2 01/27/2014 0734   LDLCALC 89 01/27/2014 0734     Imaging I have reviewed the images obtained:  CT-scan of the brain- mild atrophy with slightly greater frontal atrophy bilaterally.  Mild periventricular small vessel disease.  No acute infarct.  No mass or hemorrhage evident.   Etta Quill PA-C [facility] [phone]  M-F  (9:00 am- 5:00 PM)  08/18/2018, 3:52 PM  I have seen the patient reviewed the above note.  I spoke with both Dr. Elder Cyphers and the patient's wife.  About 5 years ago she started noticing some short-term memory problems, then 3 years ago the decline began getting a little bit more precipitous.  Currently, he has a lot of forgetfulness and will hide items around the house, then accused his wife of moving things once he is unable to find him.  He is relatively resistant to the idea of memory disorder  On exam he has normal tone, I do not detect any resting tremor.  He is very pleasant and easy to interact with on my exam.  Assessment:  77 year old male with progressive neurodegenerative decline over the past 5 years.  His clinical syndrome is likely most consistent with Alzheimer's dementia, though the presence of some visual illusions does raise the possibility of Lewy body.  I do not have any clear physical findings to push me 1 where the other, and I am not certain that it would significantly change management at this point.  I suspect that his feeling of paranoia the other day is likely related to his dementia as opposed to a deep-seated delusional disorder or other clear psychiatric condition.  Impression: Dementia, likely Alzheimer's  Recommendations: - Ammonia, folate, RPR, TSH, vitamin B12, vitamin  B1-these of all been ordered - MRI brain  -I favor avoiding neuroleptics unless absolutely necessary in this patient population, I will discontinue the Seroquel -Start donepezil 5 mg nightly -Continue memantine 10 mg twice daily -Defer Depakote and duloxetine management to psychiatry -May need memory care placement.  Roland Rack, MD [facility] [phone]  If 7pm- 7am, please page neurology on call as listed in [place].

## 2018-08-18 NOTE — ED Notes (Signed)
Pt successfully ambulating to and from bathroom without assistance. Pt sitting in chair in front of door. Calm and cooperative

## 2018-08-18 NOTE — Progress Notes (Addendum)
2pm: CSW received additional call from patient's wife requesting CSW to contact neurology for a medication adjustment. CSW informed Santiago Glad that the RN placed a note in chart and will communicate that message with the providers.  1pm: CSW spoke with Bethena Roys at [facility] who stated that no decision has been made yet regarding admission into the facility. Bethena Roys will return call to [place].  12pm: CSW received call from patient's wife Santiago Glad who stated that the patient was denied [facility] into [facility].  11am: CSW present on unit to assist with connecting patient his wife, and staff at [facility] via [place]. CSW was able to connect call but the call did not last very long approximately four minutes. Santiago Glad reports that the patient became paranoid and would not participate in phone call. Santiago Glad reports that the patient is suspicious as to what is going on. CSW and Santiago Glad discussed the possibility of alternative options to placement including the patient returning home with home health services. Santiago Glad to discuss with family and will contact CSW.  CSW informed Brie, RN of this situation.  9am: CSW attempted to reach [employer], DON of [place] to complete FaceTime request however no answer so voicemail was left requesting a return call.   CSW spoke with Brie, RN who was able to get an iPad from the Nurse Navigator to complete this call. CSW will assist once a phone call is returned from [facility].  Madilyn Fireman, MSW, LCSW-A Clinical Social Worker [facility] [phone]

## 2018-08-19 LAB — RPR: RPR Ser Ql: NONREACTIVE

## 2018-08-19 NOTE — ED Notes (Signed)
Breakfast ordered 

## 2018-08-19 NOTE — Progress Notes (Addendum)
NEUROLOGY PROGRESS NOTE  Subjective: Patient is only complaint at this time is about his hernia.    HE staets he feels much better than yesterday.   Exam: Vitals:   08/18/18 2220 08/19/18 0631  BP: (!) 138/98 (!) 174/84  Pulse: 66 67  Resp: 18 16  Temp: 97.7 F (36.5 C) 97.7 F (36.5 C)  SpO2: 97% 97%    Physical Exam   HEENT-  Normocephalic, no lesions, without obvious abnormality.  Normal external eye and conjunctiva.    Extremities- Warm, dry and intact Musculoskeletal-no joint tenderness, deformity or swelling Skin-warm and dry, no hyperpigmentation, vitiligo, or suspicious lesions    Neuro:  Mental Status: Patient is calm and alert, he does not know the month, year and believes he is here for his hernia. Cranial Nerves: II:  Visual fields grossly normal,  III,IV, VI: ptosis not present, extra-ocular motions intact bilaterally pupils equal, round, reactive to light and accommodation V,VII: smile symmetric, facial light touch sensation normal bilaterally VIII: hearing normal bilaterally IX,X: Palate rises midline XI: bilateral shoulder shrug XII: midline tongue extension Motor: Right : Upper extremity   5/5    Left:     Upper extremity   5/5  Lower extremity   5/5     Lower extremity   5/5 Tone and bulk:normal tone throughout; no atrophy noted Sensory: Pinprick and light touch intact throughout, bilaterally Deep Tendon Reflexes: 2+ and symmetric throughout Plantars: Right: downgoing   Left: downgoing     Medications:  Prior to Admission: (Not in a hospital admission)  Scheduled: . acetaminophen  650 mg Oral Once  . aspirin  162 mg Oral Daily  . budesonide (PULMICORT) nebulizer solution  0.25 mg Nebulization BID  . divalproex  250 mg Oral TID  . donepezil  5 mg Oral QHS  . dorzolamide  1 drop Both Eyes BID  . DULoxetine  30 mg Oral QODAY  . latanoprost  1 drop Right Eye QHS  . LORazepam  1 mg Intramuscular Once  . LORazepam  1 mg Oral Once  .  losartan  25 mg Oral Daily  . memantine  10 mg Oral BID  . sodium chloride flush  3 mL Intravenous Once  . verapamil  120 mg Oral Daily   Continuous:  CBJ:[id number]  Pertinent Labs/Diagnostics: -TSH 1.436 -RPR nonreactive -B12 381 -Folate 11 -Ammonia and thiamine pending  Mr Brain Wo Contrast  Result Date: 08/18/2018 CLINICAL DATA:  Progressive neuro degenerative decline over the last 5 years. Consistent with Alzheimer's dementia. Possible Lewy body dementia.IMPRESSION: No acute or reversible finding. Generalized brain atrophy, possibly with relative frontal predominance. Mild chronic small-vessel ischemic change of the cerebral hemispheric white matter. Incidental cavernoma the left parietal lobe. Electronically Signed   By: Nelson Chimes M.D.   On: 08/18/2018 21:07     Etta Quill PA-C [facility] [phone]   Assessment: 78 year old with progressive neurodegenerative decline over the past 5 years.  Clinical picture is likely most consistent with Alzheimer's dementia, though the presence of some visual hallucinations does raise the possibility of Lewy body dementia.  At this point in time his feeling of paranoia that prompted the ER visit was likely related to his dementia. He showed no signs of this on my exam today or yesterday.   I think with the medications that he has been started on, he appears to have stabilized.   Impression:  -Dementia, likely Alzheimer's  Recommendations: -Placement to a memory care unit.  Unfortunately the [place] is  not excepting him secondary to [place] although he is negative at this point time.  Social workers are working hard to find placement in memory care unit in the surrounding area. - From a neurological standpoint would not admit patient to the hospital and continue to look for memory care placement, he seems cooperative at this time.   -Continue donepezil 5 mg nightly -Continue memantine 10 mg twice daily -Defer Depakote and  duloxetine management to psychiatry  Roland Rack, MD [facility] [phone]  If 7pm- 7am, please page neurology on call as listed in [place].   08/19/2018, 10:26 AM

## 2018-08-19 NOTE — ED Notes (Signed)
Wife -- Santiago Glad [phone] would like to be notified by neurologist-- re: results of MRI/EEG and medication suggestion.

## 2018-08-19 NOTE — ED Notes (Signed)
Wife in to visit- brought a form for [place] [employer] for Dr. Leonel Ramsay to sign- message sent to dr.  Also requesting moving pt to a room with a window-- explained that there were no rooms with windows in the ED- wife requested a possible admission by neurology. Will follow up.  Requesting dr look at possibly ordering a cream called ABHR cream to help with behavior- will follow up on meds.

## 2018-08-19 NOTE — ED Notes (Signed)
Shanon Brow PA from neuro in to see pt

## 2018-08-19 NOTE — ED Provider Notes (Signed)
1600: Patient evaluated.  He is sitting on the chair, alert, conversational and pleasant.  Vital signs stable.  I have spoken to RN Santiago Glad and discussed patient's plan of care in the ER and pending disposition.  Patient has been here for almost 2 weeks pending placement.  Neurology saw patient today 7/9 and did not recommend hospitalization but placement in a memory care unit, donepezil, memantine.  Patient has been medically cleared at this point.  PT has evaluated patient and recommending Geri psych admission.  Have spoken with social worker Leeroy Cha who states from a social work standpoint patient has been declined from several facilities and anticipates prolonged ER stay.  She is to contact wife to recommend discharge home with home services and then place patient from home.  I think this is probably best for the patient.  Pending placement with social work assistance.   Kinnie Feil, PA-C 08/19/18 1605    Lacretia Leigh, MD 08/20/18 [phone]

## 2018-08-19 NOTE — Progress Notes (Addendum)
Plan for D/C on 7/10  1. Pt is to be officially D/C'd prior to his wife's arrival to the ED with an aide on 7/10, at approx 6 pm, so that pt's meds are sent to pt's wife's preferred pharmacy, so she can pick up medications when she is en route to the ED.  2. Pt's wife is requesting pt be given his night-time meds prior to arrival to forgo any problems on pt's first night at home.  3. Pt is with [facility].  CSW will email [facility] Director Dr. Daylene Posey to request pt be connected with [facility] on the day of D/C so [facility] CSW's/RN CM's can assist pt/pt's wife post-discharge.  4. Pt's wife would like the medications to be sent to: [facility] on [address] (at [address]).  RN/EDP updated.  2nd shift ED CSW will leave handoff for 1st shift ED CSW.  CSW will continue to follow for D/C needs.  Alphonse Guild. Riyaan Heroux, LCSW, LCAS, CSI Transitions of Care Clinical Social Worker Care Coordination Department Ph: [phone]

## 2018-08-19 NOTE — Progress Notes (Addendum)
CSW reviewed chart and 2nd shift ED CSW received a handoff from the 1st shift [facility] ED CSW.   Per chart, EDP states recommendation is for pt to return home.  CSW called pt's wife Cannon, [place] at ph:    [phone]    CSW informed pt's wife of EDP's recommendation and offered support and assistance from the ED CSW's and to seek assistance from the [facility] support network in helping pt once pt is D/C'd.    Pt's wife was informed that continued placement assistance from the ED has not been successful for either ALF or SNF thus far and explained disadvantages of placement from the ED, ie facilities have access to all notes explaining pt's behaviors prior to, and while in the ED.  CSW further relayed to pt's wife how it has been explained to the CSW that these documented behaviors can be considered risks to these facilities and as such, it is likely that for these reasons and others, the pt has not successfully been placed from the ED.  Pt's wife, after much discourse, voiced understanding.  CSW stated CSW would like to advocate that discharge not take place, at pt's wife request, until 7/10, after pt's wife has gotten services initiated with a private care aide agency (P[facility]) which the pt's wife stated she would pay for privately.    Pt's wife stated she had a company lined up, which she is scheduled to meet with, on the afternoon of 7/9 and plans to arrive to the ED in the evening in the company of the [place] (approx 6pm).  Pt's wife stated she would like to call the ED RN on 7/10 and request that pt be officially discharged on 7/10 so that she can then go to the pharmacy and pick up the pt's medications prior to her arriving to the ED.  Pt's wife also  Stated she would like for the EDP to go ahead and provide the pt (via the RN) with the pt's nightime medications due to the fact that the pt is known to refuse meds from the pt's wife.  CSW updated the EDP and the RN and they are aware that pt's wife  will pick up the pt on 7/10 and is requesting:                                          Plan for D/C on 7/10  1. Pt be officially D/C'd prior to her arrival so that pt's meds are sent to pt's wife's preferred pahrmacy, so pt's wife can arrive to ED with an aide,  so she can pick up medications when she is en route to the ED at approx 6pm.  2. Pt's wife is requesting pt be given his night-time meds prior to arrival.  3. Pt is with [facility].  CSW will email [employer] Director Dr. Daylene Posey to request pt be connected with [facility] on the day of D/C so [facility] CSW's/RN CM's can assist pt/pt's wife post-discharge.  3. Pt's wife would like the medications to be sent to: [facility] on [address] (at [address]).  RN/EDP updated.  2nd shift ED CSW will leave handoff for 1st shift ED CSW.  CSW will continue to follow for D/C needs.  Alphonse Guild. Duane Earnshaw, LCSW, LCAS, CSI Transitions of Care Clinical Social Worker Care Coordination Department Ph: [phone]

## 2018-08-19 NOTE — Progress Notes (Signed)
CSW received call from [facility] who states they are unable to accept patient at this time. They report their recommendation is that patient go to [facility] for 10-14 days and they will reconsider him for admission.   CSW spoke with patient's sister, Rosamaria Lints, who reports [facility] "did not deny him. They need him to have a diagnosis." [name] [name] reports she worked with ED staff to get patient a neurology work up which started yesterday and appears patient was given the diagnosis of dementia with possibly Alzheimer's. [name] [name] reports she has been working on getting all of patient's notes to be sent to different facilities to get him placement or home health. [name] [name] requested that notes be released to MyChart, so she can see them. CSW explained that patient has been in the ED for 15 days and a decision is going to need to be made soon.   Golden Circle, LCSW Transitions of Care Department [facility] ED [phone]

## 2018-08-19 NOTE — ED Notes (Signed)
Becoming more confused- thinks that another patient is his wife "Santiago Glad calling out for her, explained that it was another pt, not his wife- also pt is confused due to this nurse's name is Santiago Glad-- same as his wife.

## 2018-08-20 ENCOUNTER — Emergency Department (HOSPITAL_COMMUNITY): Payer: No Typology Code available for payment source

## 2018-08-20 LAB — URINALYSIS, ROUTINE W REFLEX MICROSCOPIC
Bacteria, UA: NONE SEEN
Bilirubin Urine: NEGATIVE
Glucose, UA: NEGATIVE mg/dL
Ketones, ur: NEGATIVE mg/dL
Leukocytes,Ua: NEGATIVE
Nitrite: NEGATIVE
Protein, ur: NEGATIVE mg/dL
Specific Gravity, Urine: 1.008 (ref 1.005–1.030)
pH: 6 (ref 5.0–8.0)

## 2018-08-20 LAB — COMPREHENSIVE METABOLIC PANEL
ALT: 29 U/L (ref 0–44)
AST: 27 U/L (ref 15–41)
Albumin: 4.2 g/dL (ref 3.5–5.0)
Alkaline Phosphatase: 60 U/L (ref 38–126)
Anion gap: 13 (ref 5–15)
BUN: 13 mg/dL (ref 8–23)
CO2: 22 mmol/L (ref 22–32)
Calcium: 8.9 mg/dL (ref 8.9–10.3)
Chloride: 103 mmol/L (ref 98–111)
Creatinine, Ser: 0.92 mg/dL (ref 0.61–1.24)
GFR calc Af Amer: >60 mL/min (ref >60)
GFR calc non Af Amer: >60 mL/min (ref >60)
Glucose, Bld: 102 mg/dL — ABNORMAL HIGH (ref 70–99)
Potassium: 3.6 mmol/L (ref 3.5–5.1)
Sodium: 138 mmol/L (ref 135–145)
Total Bilirubin: 1.7 mg/dL — ABNORMAL HIGH (ref 0.3–1.2)
Total Protein: 6.5 g/dL (ref 6.5–8.1)

## 2018-08-20 LAB — CBC WITH DIFFERENTIAL/PLATELET
Abs Immature Granulocytes: 0.03 10*3/uL (ref 0.00–0.07)
Basophils Absolute: 0.1 10*3/uL (ref 0.0–0.1)
Basophils Relative: 1 %
Eosinophils Absolute: 0 10*3/uL (ref 0.0–0.5)
Eosinophils Relative: 0 %
HCT: 43.7 % (ref 39.0–52.0)
Hemoglobin: 14.4 g/dL (ref 13.0–17.0)
Immature Granulocytes: 0 %
Lymphocytes Relative: 11 %
Lymphs Abs: 0.9 10*3/uL (ref 0.7–4.0)
MCH: 30.7 pg (ref 26.0–34.0)
MCHC: 33 g/dL (ref 30.0–36.0)
MCV: 93.2 fL (ref 80.0–100.0)
Monocytes Absolute: 0.8 10*3/uL (ref 0.1–1.0)
Monocytes Relative: 9 %
Neutro Abs: 6.6 10*3/uL (ref 1.7–7.7)
Neutrophils Relative %: 79 %
Platelets: 216 10*3/uL (ref 150–400)
RBC: 4.69 MIL/uL (ref 4.22–5.81)
RDW: 12.2 % (ref 11.5–15.5)
WBC: 8.3 10*3/uL (ref 4.0–10.5)
nRBC: 0 % (ref 0.0–0.2)

## 2018-08-20 LAB — LIPASE, BLOOD: Lipase: 50 U/L (ref 11–51)

## 2018-08-20 NOTE — ED Notes (Signed)
Pt displaying some anxious behaviors, walk with security had been discussed day prior so pt taken taking walk with security now. Pt agreeable to this

## 2018-08-20 NOTE — Progress Notes (Signed)
CSW called pt's wife at [phone] to inform her a consult has been placed and that the CSW tonight or tomorrow would updated her at to if and when pt will be D/C'd following the completion of the consult to psychiatriy for REQUESTED assessment for medication adjustments.  CSW stated that this consult is a request for medication adjustment but that it is not a given the adjustment will take place and that it is up top the decision of the psychiatric practitioner copnsulted.  Pt's wife then asked that if pt IS given a medication adjustment to treat pt's agitation ifhe will be kept in the ED for a few days to observe and monitor the pt post med changes.  CSW stated that this is not likely and that in the CSW's experience if a med adjustment requires that much observation and adjustment then psychiatry usually requests that pt/pt's family follow up with an outpatient provider for med changes post-D/C.  Of note: The suggestion of the med adjustment was given by a psychiatrist who reviewed pt's case at the request of the [facility] Director.  [facility] is involved because the CSW (this Probation officer) is attempting  to connect the pt with [facility] post-D/C since the pt is already a [facility] pt, per the chart.  The suggestion by psychiatry who reviewed pt's case was to, "adjust the Depakote and add a low dose atypical antipsychotic".  Please see note from today at 3:48pm (from this Probation officer).  CSW will continue to follow for D/C needs.  Alphonse Guild. Sanjuan Sawa, LCSW, LCAS, CSI Transitions of Care Clinical Social Worker Care Coordination Department Ph: [phone]

## 2018-08-20 NOTE — Progress Notes (Addendum)
CSW referred pt to [facility] Medical Director Dr. Daylene Posey for post-D/C assistance due to pt being a [facility] pt and pt's case was reviewed by a psychiatrist at the behest of Dr. Amalia Hailey who noted pt's agitation and suggested an adjustment "to the Depakote and add a low dose atypical antipsychotic" for pt's agitation.  CSW spoke to pt's EDP's who will consult psychiatry to see if a medication adjustment prior to D/C would be appropriate in order to give pt's spouse a better disposition outcome due to pt's spouse's difficulties in getting pt to an outpatient provider.  In this way pt's wife and [facility] hope to lessen the possibility of pt returning to the ED unnecessarily.  EDP stated a consult for psychiatry will be placed in order to assess for a medication adjustment for agitation prior to D./C.  CSW will continue to follow for D/C needs.  Alphonse Guild. Waseem Suess, LCSW, LCAS, CSI Transitions of Care Clinical Social Worker Care Coordination Department Ph: [phone]

## 2018-08-20 NOTE — Discharge Planning (Signed)
EDCM received call from wife, Santiago Glad to clarify disposition.  Santiago Glad states she would like for pt to have been administered evening Rx prior to discharge today and would like to have home Rx e-scribed to pharmacy so she could pick up on her way to get him.

## 2018-08-20 NOTE — ED Provider Notes (Signed)
Emergency Medicine Observation Re-evaluation Note  [name] [name] is a 78 y.o. male, seen on rounds today.  Pt initially presented to the ED for complaints of Altered Mental Status Currently, the patient is complaining of bilateral lower abdominal pain.  He reports that he had a large bowel movement approximately 15 minutes prior without significant relief.  He states that this pain has been worsening over the past 3 to 4 days.  Chart review that he was seen on 7/8 by PA and Dr Gilford Raid for evaluation of the abdominal pain which was felt to be related to hernia and appropriate for outpatient follow-up.  He has not had an abdominal scan in the past 5 years according to chart review.  I spoke with his wife who reports that he has had worsening lower abdominal pain over the past month.  He denies any urinary symptoms.  Physical Exam  BP (!) 147/87 (BP Location: Left Arm)   Pulse 72   Temp 98.2 F (36.8 C) (Oral)   Resp 19   Ht 6' (1.829 m)   Wt 90 kg   SpO2 98%   BMI 26.91 kg/m  Physical Exam Vitals signs and nursing note reviewed.  Constitutional:      General: He is not in acute distress.    Appearance: He is well-developed. He is not diaphoretic.  HENT:     Head: Normocephalic and atraumatic.  Eyes:     General: No scleral icterus.       Right eye: No discharge.        Left eye: No discharge.     Conjunctiva/sclera: Conjunctivae normal.  Neck:     Musculoskeletal: Normal range of motion.  Cardiovascular:     Rate and Rhythm: Normal rate and regular rhythm.  Pulmonary:     Effort: Pulmonary effort is normal. No respiratory distress.     Breath sounds: No stridor.  Abdominal:     General: There is no distension.     Tenderness: There is abdominal tenderness (Diffuse tenderness to palpation over entire abdomen, primarily in the bilateral lower quadrants.). There is no rebound.  Musculoskeletal:        General: No deformity.  Skin:    General: Skin is warm and dry.   Neurological:     Mental Status: He is alert.     Motor: No abnormal muscle tone.     Comments: Awake and alert, conversant.  Playing cards at table.   Psychiatric:        Mood and Affect: Mood normal.        Behavior: Behavior normal.     ED Course / MDM  EKG:EKG Interpretation  Date/Time:  Wednesday August 04 2018 13:19:30 EDT Ventricular Rate:  87 PR Interval:  174 QRS Duration: 98 QT Interval:  364 QTC Calculation: 438 R Axis:   21 Text Interpretation:  Sinus rhythm Ventricular premature complex Probable left atrial enlargement Borderline repolarization abnormality Confirmed by Addison Lank [phone]) on 08/05/2018 11:07:04 AM  Clinical Course as of Aug 20 1315  Wed Aug 04, 2018  1248 Patient is with no focal neurological deficits, he has had a very gradual progression to today's events, currently I see no indication for CNS imaging.   [MB]    Clinical Course User Index [MB] Maudie Flakes, MD   I have reviewed the labs performed to date as well as medications administered while in observation.  Recent changes in the last 24 hours include plan for patient to be discharged  home today with wife and care aide.   Plan  Current plan is for patient to be discharged home today with wife and care aide.  Based on worsening abdominal pain, after discussions with Dr. Gilford Raid, will check abdominal labs, UA and obtain CT scan noncontrast of abdomen pelvis given his contrast media allergy.  While he does have hernia that is easily reducible question diverticulosis or other cause of pain. Patient is under full IVC at this time.  Ct Abdomen Pelvis Wo Contrast  Result Date: 08/20/2018 CLINICAL DATA:  Lower abdominal pain, diverticulitis suspected, history hernia EXAM: CT ABDOMEN AND PELVIS WITHOUT CONTRAST TECHNIQUE: Multidetector CT imaging of the abdomen and pelvis was performed following the standard protocol without IV contrast. Oral enteric contrast. COMPARISON:  None. FINDINGS: Lower  chest: No acute abnormality. Hepatobiliary: No focal liver abnormality is seen. Status post cholecystectomy. No biliary dilatation. Pancreas: Unremarkable. No pancreatic ductal dilatation or surrounding inflammatory changes. Spleen: Normal in size without significant abnormality. Adrenals/Urinary Tract: Adrenal glands are unremarkable. Scarring of the superior pole of the right kidney. Tiny nonobstructive inferior pole calculus. No hydronephrosis. Bladder is unremarkable. Stomach/Bowel: Stomach is within normal limits. No evidence of bowel wall thickening, distention, or inflammatory changes. Sigmoid diverticulosis. Vascular/Lymphatic: Aortic atherosclerosis. No enlarged abdominal or pelvic lymph nodes. Reproductive: Prostatomegaly. Other: No abdominal wall hernia or abnormality. No abdominopelvic ascites. Musculoskeletal: No acute or significant osseous findings. IMPRESSION: 1. Sigmoid diverticulosis without evidence of acute diverticulitis. No acute CT findings of the abdomen or pelvis to explain pain. 2.  Other chronic and incidental findings as detailed above. Electronically Signed   By: Eddie Candle M.D.   On: 08/20/2018 12:59    Labs Reviewed  COMPREHENSIVE METABOLIC PANEL - Abnormal; Notable for the following components:      Result Value   Glucose, Bld 110 (*)    Total Bilirubin 1.8 (*)    All other components within normal limits  URINALYSIS, ROUTINE W REFLEX MICROSCOPIC - Abnormal; Notable for the following components:   Hgb urine dipstick SMALL (*)    Ketones, ur 20 (*)    All other components within normal limits  VALPROIC ACID LEVEL - Abnormal; Notable for the following components:   Valproic Acid Lvl 41 (*)    All other components within normal limits  COMPREHENSIVE METABOLIC PANEL - Abnormal; Notable for the following components:   Glucose, Bld 102 (*)    Total Bilirubin 1.7 (*)    All other components within normal limits  URINALYSIS, ROUTINE W REFLEX MICROSCOPIC - Abnormal;  Notable for the following components:   Color, Urine STRAW (*)    Hgb urine dipstick SMALL (*)    All other components within normal limits  I-STAT CHEM 8, ED - Abnormal; Notable for the following components:   Glucose, Bld 108 (*)    All other components within normal limits  SARS CORONAVIRUS 2 (HOSPITAL ORDER, [place] [facility])  SARS CORONAVIRUS 2 (HOSPITAL ORDER, PERFORMED IN [place] [facility])  PROTIME-INR  APTT  CBC  DIFFERENTIAL  ETHANOL  RAPID URINE DRUG SCREEN, HOSP PERFORMED  TSH  RPR  FOLATE  VITAMIN B12  LIPASE, BLOOD  CBC WITH DIFFERENTIAL/PLATELET  AMMONIA  VITAMIN B1  TB SKIN TEST   Labs, UA, and CT scan reviewed without cause for his pain or symptoms found.  I spoke with his wife Santiago Glad and informed her of the results.    Lorin Glass, Hershal Coria 08/20/18 1320    [name], MD  08/23/18 0719  

## 2018-08-21 LAB — VITAMIN B1: Vitamin B1 (Thiamine): 96.3 nmol/L (ref 66.5–200.0)

## 2018-08-21 LAB — VALPROIC ACID LEVEL: Valproic Acid Lvl: 45 ug/mL — ABNORMAL LOW (ref 50.0–100.0)

## 2018-08-21 MED ORDER — MEMANTINE HCL 10 MG PO TABS: 10.0000 mg | ORAL_TABLET | Freq: Two times a day (BID) | ORAL | 0 refills | Status: AC

## 2018-08-21 MED ORDER — RISPERIDONE 0.5 MG PO TABS
0.5000 mg | ORAL_TABLET | Freq: Two times a day (BID) | ORAL | Status: DC
  Administered 2018-08-21: 0.5 mg via ORAL
  Filled 2018-08-21 (×2): qty 1

## 2018-08-21 MED ORDER — DORZOLAMIDE HCL 2 % OP SOLN: 1.0000 [drp] | Freq: Two times a day (BID) | OPHTHALMIC | 0 refills | Status: AC

## 2018-08-21 MED ORDER — LATANOPROST 0.005 % OP SOLN: 1.0000 [drp] | Freq: Every day | OPHTHALMIC | 0 refills | Status: AC

## 2018-08-21 MED ORDER — DIVALPROEX SODIUM 125 MG PO CSDR
500.0000 mg | DELAYED_RELEASE_CAPSULE | Freq: Two times a day (BID) | ORAL | Status: DC
  Filled 2018-08-21: qty 4

## 2018-08-21 MED ORDER — ALBUTEROL SULFATE HFA 108 (90 BASE) MCG/ACT IN AERS: 1.0000 | INHALATION_SPRAY | Freq: Four times a day (QID) | RESPIRATORY_TRACT | 0 refills | Status: AC | PRN

## 2018-08-21 MED ORDER — DULOXETINE HCL 30 MG PO CPEP: 30.0000 mg | ORAL_CAPSULE | ORAL | 0 refills | Status: AC

## 2018-08-21 MED ORDER — RISPERIDONE 0.5 MG PO TABS: 0.5000 mg | ORAL_TABLET | Freq: Two times a day (BID) | ORAL | 0 refills | Status: AC

## 2018-08-21 MED ORDER — DONEPEZIL HCL 5 MG PO TABS: 5.0000 mg | ORAL_TABLET | Freq: Every day | ORAL | 0 refills | Status: AC

## 2018-08-21 MED ORDER — DIVALPROEX SODIUM 125 MG PO CSDR: 500.0000 mg | DELAYED_RELEASE_CAPSULE | Freq: Two times a day (BID) | ORAL | 0 refills | Status: AC

## 2018-08-21 MED ORDER — VERAPAMIL HCL ER 120 MG PO TBCR: 120.0000 mg | EXTENDED_RELEASE_TABLET | Freq: Every day | ORAL | 0 refills | Status: AC

## 2018-08-21 MED ORDER — ASPIRIN 81 MG PO TBEC: 162.5000 mg | DELAYED_RELEASE_TABLET | Freq: Every day | ORAL | 0 refills | Status: AC

## 2018-08-21 MED ORDER — ALBUTEROL SULFATE HFA 108 (90 BASE) MCG/ACT IN AERS: INHALATION_SPRAY | RESPIRATORY_TRACT | 0 refills | Status: AC

## 2018-08-21 MED ORDER — LOSARTAN POTASSIUM 25 MG PO TABS: 25.0000 mg | ORAL_TABLET | Freq: Every day | ORAL | 0 refills | Status: AC

## 2018-08-21 NOTE — ED Notes (Signed)
Received verbal permission to inform pt's sister Arbie Cookey about his medication/plan of care

## 2018-08-21 NOTE — ED Provider Notes (Signed)
14:00: Patient resting comfortably in no acute distress on evaluation.   Cleared by [place] for discharge home.  Family requesting refills of all of patient's medicines be sent to [facility]- prescriptions for 30 days filled for all medicines patient taking in the ED have been sent.      Leafy Kindle 08/21/18 1422    Charlesetta Shanks, MD 08/22/18 [phone]

## 2018-08-21 NOTE — Progress Notes (Signed)
CSW received phone call from family about patient being discharged. CSW reviewed chart and informed them that [facility] made the recommendation to evaluate a medication change. CSW following up on the psych referral. CSW noted family has concerns with delayed discharge and stated they wanted [facility] in addition. CSW informed RNCM of home health request.  Lamonte Richer, LCSW, [place] Worker II [phone]

## 2018-08-24 DIAGNOSIS — I10 Essential (primary) hypertension: Secondary | ICD-10-CM | POA: Diagnosis not present

## 2018-08-24 DIAGNOSIS — F0391 Unspecified dementia with behavioral disturbance: Secondary | ICD-10-CM | POA: Diagnosis not present

## 2018-08-24 DIAGNOSIS — I429 Cardiomyopathy, unspecified: Secondary | ICD-10-CM | POA: Diagnosis not present

## 2018-08-24 DIAGNOSIS — Z77098 Contact with and (suspected) exposure to other hazardous, chiefly nonmedicinal, chemicals: Secondary | ICD-10-CM | POA: Diagnosis not present

## 2018-08-24 DIAGNOSIS — K219 Gastro-esophageal reflux disease without esophagitis: Secondary | ICD-10-CM | POA: Diagnosis not present

## 2018-08-24 DIAGNOSIS — M1990 Unspecified osteoarthritis, unspecified site: Secondary | ICD-10-CM | POA: Diagnosis not present

## 2018-08-24 DIAGNOSIS — R1030 Lower abdominal pain, unspecified: Secondary | ICD-10-CM | POA: Diagnosis not present

## 2018-08-24 DIAGNOSIS — J45909 Unspecified asthma, uncomplicated: Secondary | ICD-10-CM | POA: Diagnosis not present

## 2018-08-24 DIAGNOSIS — F419 Anxiety disorder, unspecified: Secondary | ICD-10-CM | POA: Diagnosis not present

## 2018-08-25 DIAGNOSIS — F419 Anxiety disorder, unspecified: Secondary | ICD-10-CM | POA: Diagnosis not present

## 2018-08-25 DIAGNOSIS — J45909 Unspecified asthma, uncomplicated: Secondary | ICD-10-CM | POA: Diagnosis not present

## 2018-08-25 DIAGNOSIS — I10 Essential (primary) hypertension: Secondary | ICD-10-CM | POA: Diagnosis not present

## 2018-08-25 DIAGNOSIS — F0391 Unspecified dementia with behavioral disturbance: Secondary | ICD-10-CM | POA: Diagnosis not present

## 2018-08-25 DIAGNOSIS — R1030 Lower abdominal pain, unspecified: Secondary | ICD-10-CM | POA: Diagnosis not present

## 2018-08-25 DIAGNOSIS — M1990 Unspecified osteoarthritis, unspecified site: Secondary | ICD-10-CM | POA: Diagnosis not present

## 2018-08-27 DIAGNOSIS — F0391 Unspecified dementia with behavioral disturbance: Secondary | ICD-10-CM | POA: Diagnosis not present

## 2018-08-27 DIAGNOSIS — F419 Anxiety disorder, unspecified: Secondary | ICD-10-CM | POA: Diagnosis not present

## 2018-08-27 DIAGNOSIS — J45909 Unspecified asthma, uncomplicated: Secondary | ICD-10-CM | POA: Diagnosis not present

## 2018-08-27 DIAGNOSIS — I10 Essential (primary) hypertension: Secondary | ICD-10-CM | POA: Diagnosis not present

## 2018-08-27 DIAGNOSIS — R1030 Lower abdominal pain, unspecified: Secondary | ICD-10-CM | POA: Diagnosis not present

## 2018-08-27 DIAGNOSIS — M1990 Unspecified osteoarthritis, unspecified site: Secondary | ICD-10-CM | POA: Diagnosis not present

## 2018-09-03 DIAGNOSIS — R1030 Lower abdominal pain, unspecified: Secondary | ICD-10-CM | POA: Diagnosis not present

## 2018-09-03 DIAGNOSIS — F419 Anxiety disorder, unspecified: Secondary | ICD-10-CM | POA: Diagnosis not present

## 2018-09-03 DIAGNOSIS — F0391 Unspecified dementia with behavioral disturbance: Secondary | ICD-10-CM | POA: Diagnosis not present

## 2018-09-03 DIAGNOSIS — M1990 Unspecified osteoarthritis, unspecified site: Secondary | ICD-10-CM | POA: Diagnosis not present

## 2018-09-03 DIAGNOSIS — J45909 Unspecified asthma, uncomplicated: Secondary | ICD-10-CM | POA: Diagnosis not present

## 2018-09-03 DIAGNOSIS — I10 Essential (primary) hypertension: Secondary | ICD-10-CM | POA: Diagnosis not present

## 2018-09-07 DIAGNOSIS — M1990 Unspecified osteoarthritis, unspecified site: Secondary | ICD-10-CM | POA: Diagnosis not present

## 2018-09-07 DIAGNOSIS — F419 Anxiety disorder, unspecified: Secondary | ICD-10-CM | POA: Diagnosis not present

## 2018-09-07 DIAGNOSIS — F0391 Unspecified dementia with behavioral disturbance: Secondary | ICD-10-CM | POA: Diagnosis not present

## 2018-09-07 DIAGNOSIS — R1030 Lower abdominal pain, unspecified: Secondary | ICD-10-CM | POA: Diagnosis not present

## 2018-09-07 DIAGNOSIS — I10 Essential (primary) hypertension: Secondary | ICD-10-CM | POA: Diagnosis not present

## 2018-09-07 DIAGNOSIS — J45909 Unspecified asthma, uncomplicated: Secondary | ICD-10-CM | POA: Diagnosis not present

## 2018-09-23 DIAGNOSIS — I429 Cardiomyopathy, unspecified: Secondary | ICD-10-CM | POA: Diagnosis not present

## 2018-09-23 DIAGNOSIS — F0391 Unspecified dementia with behavioral disturbance: Secondary | ICD-10-CM | POA: Diagnosis not present

## 2018-09-23 DIAGNOSIS — R1030 Lower abdominal pain, unspecified: Secondary | ICD-10-CM | POA: Diagnosis not present

## 2018-09-23 DIAGNOSIS — Z77098 Contact with and (suspected) exposure to other hazardous, chiefly nonmedicinal, chemicals: Secondary | ICD-10-CM | POA: Diagnosis not present

## 2018-09-23 DIAGNOSIS — K219 Gastro-esophageal reflux disease without esophagitis: Secondary | ICD-10-CM | POA: Diagnosis not present

## 2018-09-23 DIAGNOSIS — J45909 Unspecified asthma, uncomplicated: Secondary | ICD-10-CM | POA: Diagnosis not present

## 2018-09-23 DIAGNOSIS — I10 Essential (primary) hypertension: Secondary | ICD-10-CM | POA: Diagnosis not present

## 2018-09-23 DIAGNOSIS — M1990 Unspecified osteoarthritis, unspecified site: Secondary | ICD-10-CM | POA: Diagnosis not present

## 2018-09-23 DIAGNOSIS — F419 Anxiety disorder, unspecified: Secondary | ICD-10-CM | POA: Diagnosis not present

## 2018-09-24 DIAGNOSIS — R350 Frequency of micturition: Secondary | ICD-10-CM | POA: Diagnosis not present

## 2018-09-24 DIAGNOSIS — N419 Inflammatory disease of prostate, unspecified: Secondary | ICD-10-CM | POA: Diagnosis not present

## 2018-09-25 DIAGNOSIS — R1030 Lower abdominal pain, unspecified: Secondary | ICD-10-CM | POA: Diagnosis not present

## 2018-09-25 DIAGNOSIS — F419 Anxiety disorder, unspecified: Secondary | ICD-10-CM | POA: Diagnosis not present

## 2018-09-25 DIAGNOSIS — M1990 Unspecified osteoarthritis, unspecified site: Secondary | ICD-10-CM | POA: Diagnosis not present

## 2018-09-25 DIAGNOSIS — F0391 Unspecified dementia with behavioral disturbance: Secondary | ICD-10-CM | POA: Diagnosis not present

## 2018-09-25 DIAGNOSIS — I10 Essential (primary) hypertension: Secondary | ICD-10-CM | POA: Diagnosis not present

## 2018-09-25 DIAGNOSIS — J45909 Unspecified asthma, uncomplicated: Secondary | ICD-10-CM | POA: Diagnosis not present

## 2018-09-27 DIAGNOSIS — F419 Anxiety disorder, unspecified: Secondary | ICD-10-CM | POA: Diagnosis not present

## 2018-09-27 DIAGNOSIS — R1030 Lower abdominal pain, unspecified: Secondary | ICD-10-CM | POA: Diagnosis not present

## 2018-09-27 DIAGNOSIS — I10 Essential (primary) hypertension: Secondary | ICD-10-CM | POA: Diagnosis not present

## 2018-09-27 DIAGNOSIS — F0391 Unspecified dementia with behavioral disturbance: Secondary | ICD-10-CM | POA: Diagnosis not present

## 2018-09-27 DIAGNOSIS — M1990 Unspecified osteoarthritis, unspecified site: Secondary | ICD-10-CM | POA: Diagnosis not present

## 2018-09-27 DIAGNOSIS — J45909 Unspecified asthma, uncomplicated: Secondary | ICD-10-CM | POA: Diagnosis not present

## 2018-10-01 DIAGNOSIS — G301 Alzheimer's disease with late onset: Secondary | ICD-10-CM | POA: Diagnosis not present

## 2018-10-01 DIAGNOSIS — F0281 Dementia in other diseases classified elsewhere with behavioral disturbance: Secondary | ICD-10-CM | POA: Diagnosis not present

## 2018-10-01 DIAGNOSIS — H353210 Exudative age-related macular degeneration, right eye, stage unspecified: Secondary | ICD-10-CM | POA: Diagnosis not present

## 2018-10-01 DIAGNOSIS — I110 Hypertensive heart disease with heart failure: Secondary | ICD-10-CM | POA: Diagnosis not present

## 2018-10-01 DIAGNOSIS — N401 Enlarged prostate with lower urinary tract symptoms: Secondary | ICD-10-CM | POA: Diagnosis not present

## 2018-10-01 DIAGNOSIS — Z79899 Other long term (current) drug therapy: Secondary | ICD-10-CM | POA: Diagnosis not present

## 2018-10-01 DIAGNOSIS — N433 Hydrocele, unspecified: Secondary | ICD-10-CM | POA: Diagnosis not present

## 2018-10-01 DIAGNOSIS — R350 Frequency of micturition: Secondary | ICD-10-CM | POA: Diagnosis not present

## 2018-10-01 DIAGNOSIS — H401191 Primary open-angle glaucoma, unspecified eye, mild stage: Secondary | ICD-10-CM | POA: Diagnosis not present

## 2018-10-10 DIAGNOSIS — R4182 Altered mental status, unspecified: Secondary | ICD-10-CM | POA: Diagnosis not present

## 2018-10-10 DIAGNOSIS — F918 Other conduct disorders: Secondary | ICD-10-CM | POA: Diagnosis not present

## 2020-03-08 IMAGING — DX PORTABLE CHEST - 1 VIEW
1 series · 1 of 1 positions shown · non-contrast
Comparison: CT shoulder November 27, 2013

CLINICAL DATA: MRI screening. Altered mental status. History of
dementia, asthma, cardiomyopathy.

EXAM:
PORTABLE CHEST 1 VIEW

[chest ap]
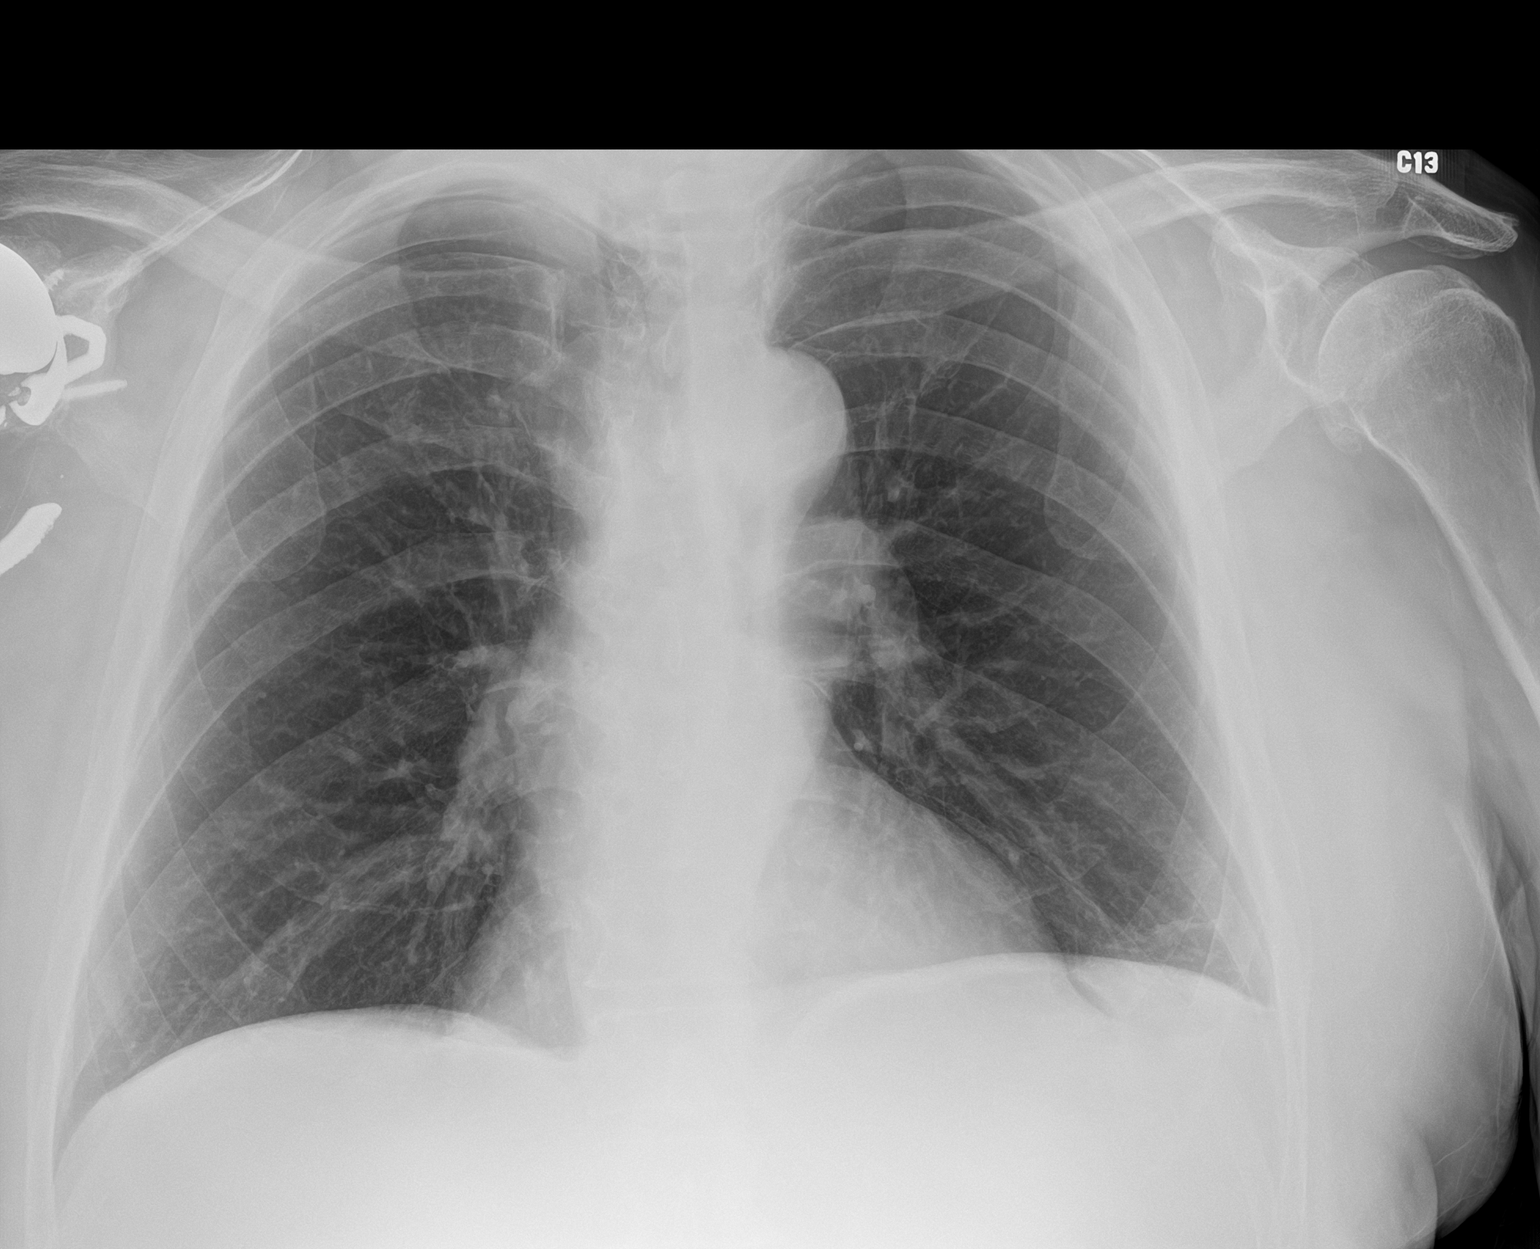

[1 of 1 positions shown; findings below may reference images not displayed]

FINDINGS: The heart size and mediastinal contours are within normal limits.
Both lungs are clear. Redemonstration of a right shoulder prosthesis
with high-riding appearance of the humeral component and stable
appearance of the fractured glenoid component with portions
visualized in the inferior right shoulder joint recess. Degenerative
changes in the left shoulder. No acute osseous or soft tissue
abnormality.
IMPRESSION: No acute cardiopulmonary disease.

Redemonstration of right shoulder arthroplasty with fracture glenoid
prosthesis. Please refer to manufactures specifications for
assessment of MRI safety and compatibility.

## 2020-03-10 IMAGING — CT CT ABDOMEN AND PELVIS WITHOUT CONTRAST
2 of 4 series · 17 of 46 positions shown, 19 images · non-contrast
Comparison: None.

CLINICAL DATA: Lower abdominal pain, diverticulitis suspected,
history hernia

EXAM:
CT ABDOMEN AND PELVIS WITHOUT CONTRAST
TECHNIQUE: Multidetector CT imaging of the abdomen and pelvis was performed
following the standard protocol without IV contrast. Oral enteric
contrast.

[Series 3: a/p w/o 5mm · axial · non-contrast · 0.98mm/px · z∈[+808,+1233]mm · 14 of 93 slices shown, 16 images]
[im 4/93  soft-tissue]
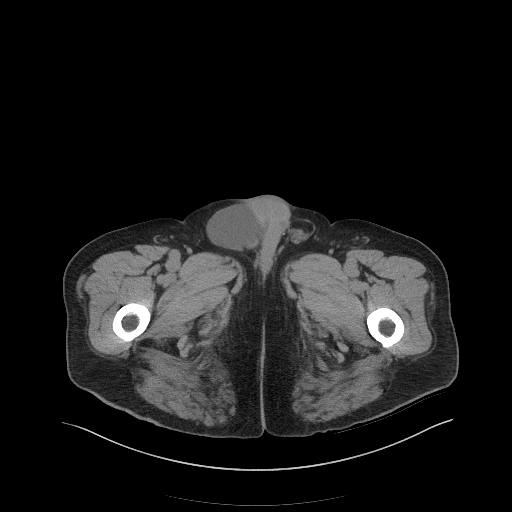
[im 4/93  bone]
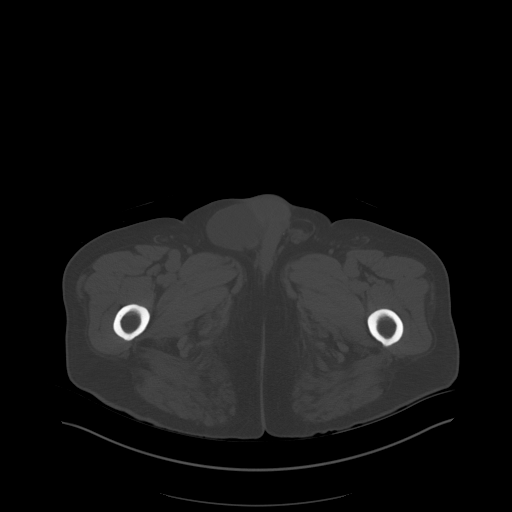
[im 12/93  soft-tissue]
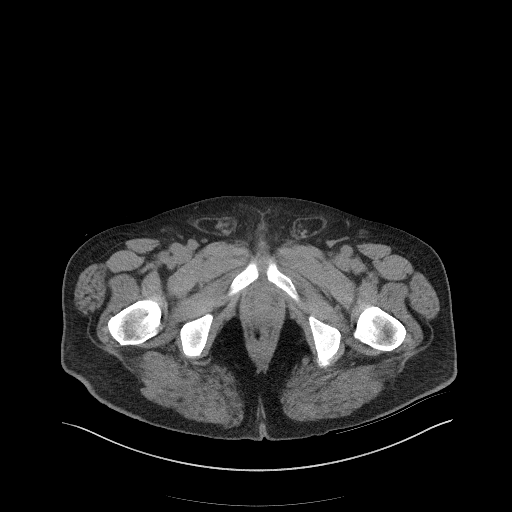
[im 20/93  soft-tissue]
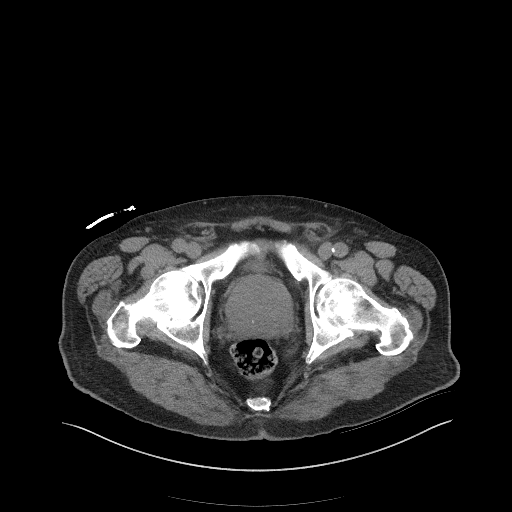
[im 24/93  soft-tissue]
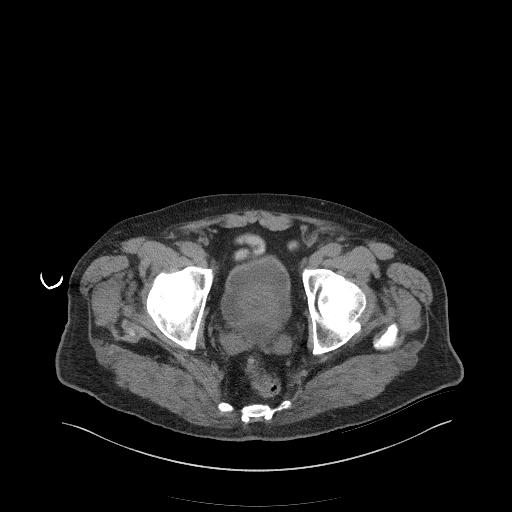
[im 31/93  soft-tissue]
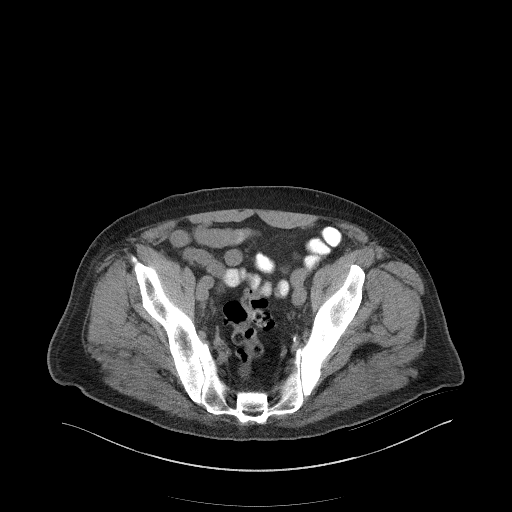
[im 39/93  soft-tissue]
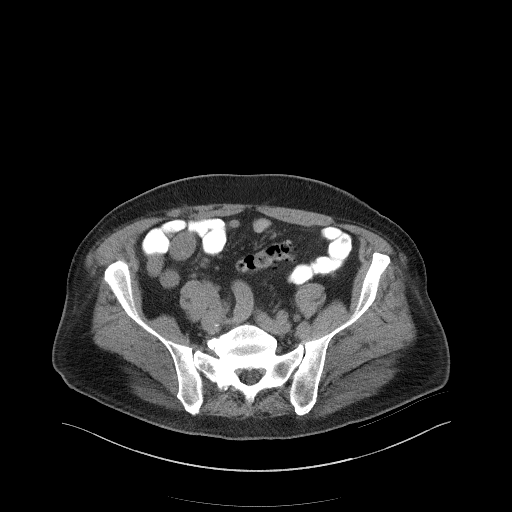
[im 43/93  soft-tissue]
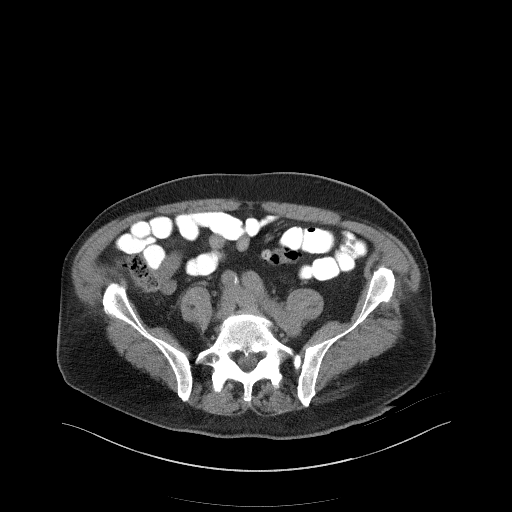
[im 50/93  soft-tissue]
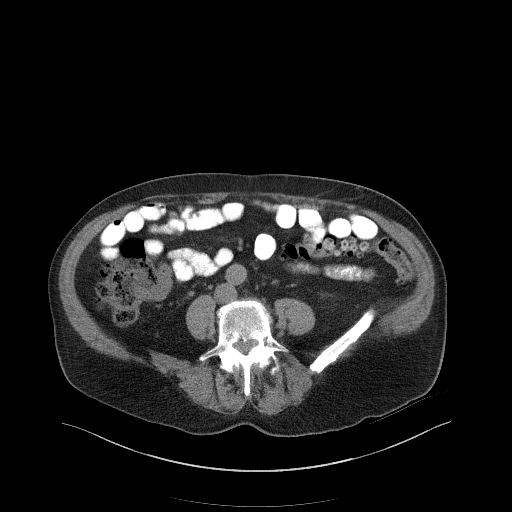
[im 54/93  soft-tissue]
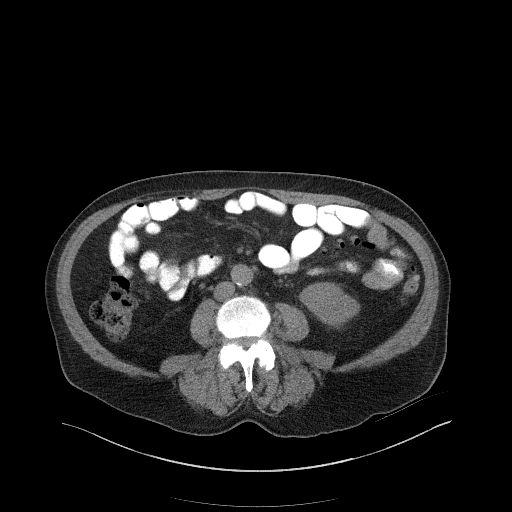
[im 54/93  bone]
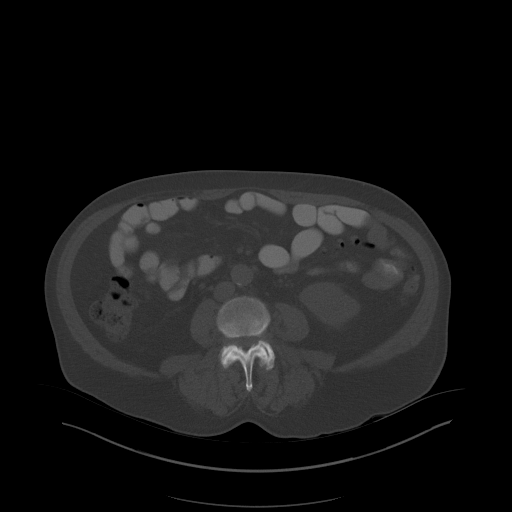
[im 62/93  soft-tissue]
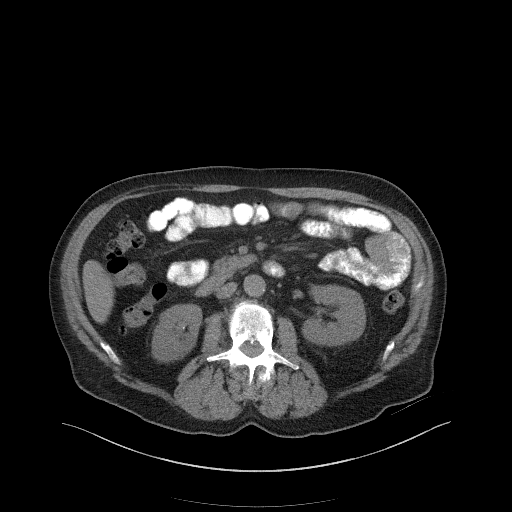
[im 70/93  soft-tissue]
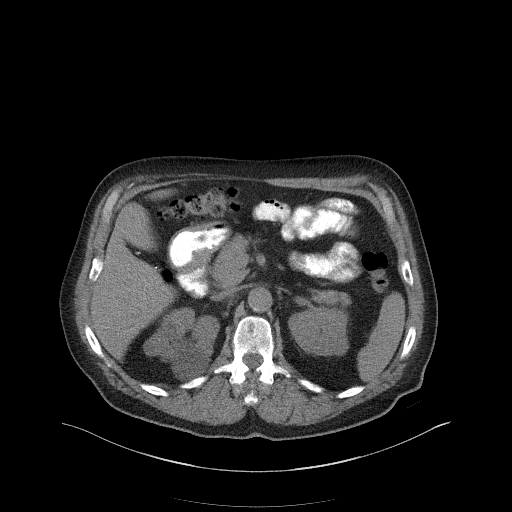
[im 73/93  soft-tissue]
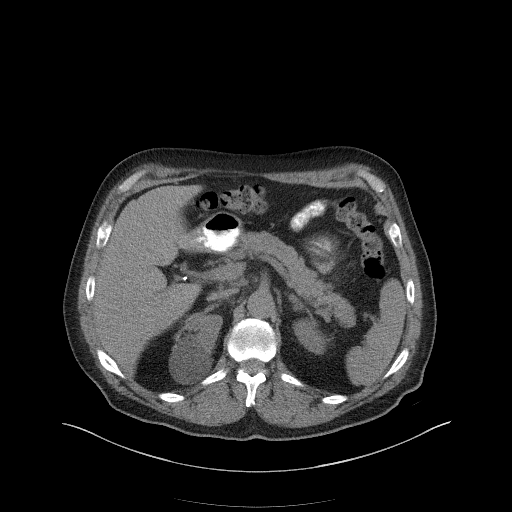
[im 81/93  soft-tissue]
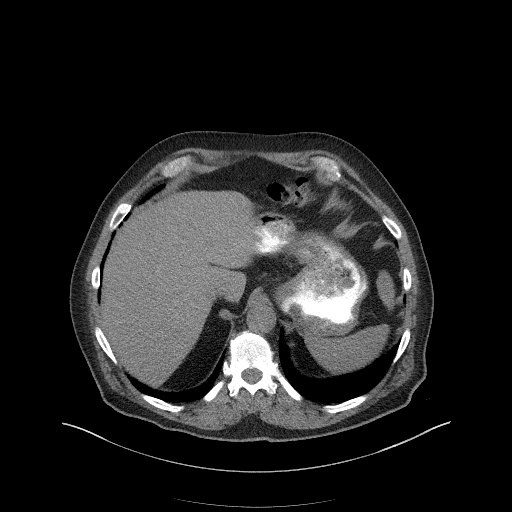
[im 89/93  soft-tissue]
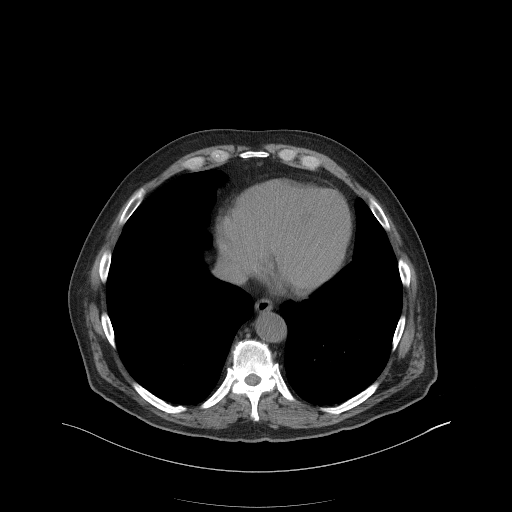

[Series 6: a/p w/o cor · coronal · non-contrast · 0.90mm/px · 3 of 174 slices shown]
[im 58/174  soft-tissue]
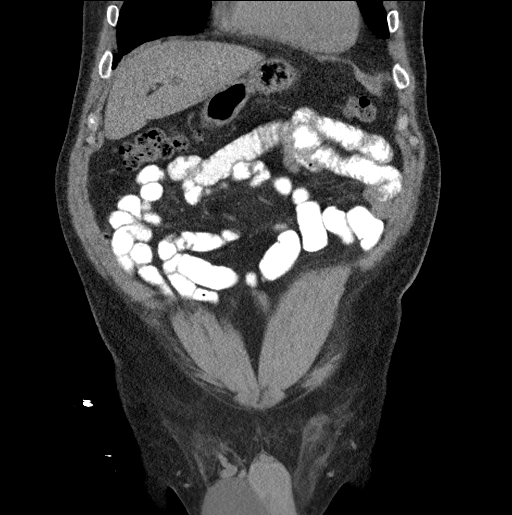
[im 77/174  soft-tissue]
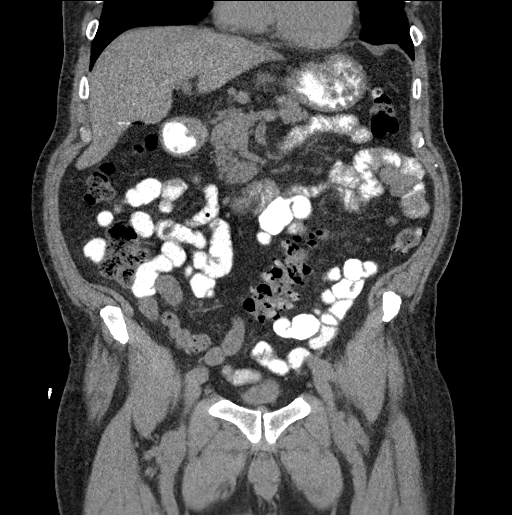
[im 97/174  soft-tissue]
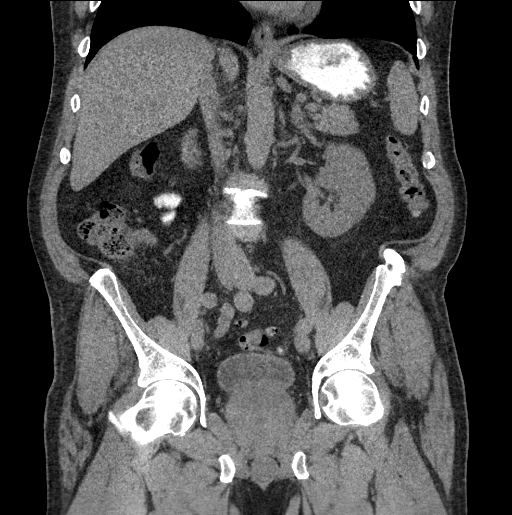

[17 of 46 positions shown; findings below may reference images not displayed]

FINDINGS: Lower chest: No acute abnormality.

Hepatobiliary: No focal liver abnormality is seen. Status post
cholecystectomy. No biliary dilatation.

Pancreas: Unremarkable. No pancreatic ductal dilatation or
surrounding inflammatory changes.

Spleen: Normal in size without significant abnormality.

Adrenals/Urinary Tract: Adrenal glands are unremarkable. Scarring of
the superior pole of the right kidney. Tiny nonobstructive inferior
pole calculus. No hydronephrosis. Bladder is unremarkable.

Stomach/Bowel: Stomach is within normal limits. No evidence of bowel
wall thickening, distention, or inflammatory changes. Sigmoid
diverticulosis.

Vascular/Lymphatic: Aortic atherosclerosis. No enlarged abdominal or
pelvic lymph nodes.

Reproductive: Prostatomegaly.

Other: No abdominal wall hernia or abnormality. No abdominopelvic
ascites.

Musculoskeletal: No acute or significant osseous findings.
IMPRESSION: 1. Sigmoid diverticulosis without evidence of acute diverticulitis.
No acute CT findings of the abdomen or pelvis to explain pain.

2.  Other chronic and incidental findings as detailed above.
